# Patient Record
Sex: Male | Born: 1970 | Race: Black or African American | Hispanic: No | Marital: Single | Smoking: Never smoker
Health system: Southern US, Community
[De-identification: ages and names within clinical notes are randomized; demographics above are authoritative.]

## PROBLEM LIST (undated history)

## (undated) DIAGNOSIS — F32A Depression, unspecified: Secondary | ICD-10-CM

## (undated) DIAGNOSIS — J9601 Acute respiratory failure with hypoxia: Secondary | ICD-10-CM

## (undated) DIAGNOSIS — N186 End stage renal disease: Secondary | ICD-10-CM

## (undated) DIAGNOSIS — IMO0001 Reserved for inherently not codable concepts without codable children: Secondary | ICD-10-CM

## (undated) DIAGNOSIS — G8250 Quadriplegia, unspecified: Secondary | ICD-10-CM

## (undated) DIAGNOSIS — R079 Chest pain, unspecified: Secondary | ICD-10-CM

## (undated) DIAGNOSIS — F329 Major depressive disorder, single episode, unspecified: Secondary | ICD-10-CM

## (undated) DIAGNOSIS — F259 Schizoaffective disorder, unspecified: Secondary | ICD-10-CM

## (undated) DIAGNOSIS — E0590 Thyrotoxicosis, unspecified without thyrotoxic crisis or storm: Secondary | ICD-10-CM

## (undated) DIAGNOSIS — Z992 Dependence on renal dialysis: Secondary | ICD-10-CM

## (undated) DIAGNOSIS — F250 Schizoaffective disorder, bipolar type: Secondary | ICD-10-CM

## (undated) DIAGNOSIS — I10 Essential (primary) hypertension: Secondary | ICD-10-CM

## (undated) DIAGNOSIS — D649 Anemia, unspecified: Secondary | ICD-10-CM

## (undated) DIAGNOSIS — J189 Pneumonia, unspecified organism: Secondary | ICD-10-CM

## (undated) HISTORY — PX: DIALYSIS FISTULA CREATION: SHX611

---

## 2002-03-31 ENCOUNTER — Emergency Department (HOSPITAL_COMMUNITY): Admission: EM | Admit: 2002-03-31 | Discharge: 2002-03-31 | Payer: Self-pay | Admitting: Emergency Medicine

## 2002-03-31 ENCOUNTER — Encounter: Payer: Self-pay | Admitting: Emergency Medicine

## 2002-04-02 ENCOUNTER — Encounter (HOSPITAL_BASED_OUTPATIENT_CLINIC_OR_DEPARTMENT_OTHER): Admission: RE | Admit: 2002-04-02 | Discharge: 2002-07-01 | Payer: Self-pay | Admitting: Internal Medicine

## 2002-05-29 ENCOUNTER — Emergency Department (HOSPITAL_COMMUNITY): Admission: EM | Admit: 2002-05-29 | Discharge: 2002-05-29 | Payer: Self-pay | Admitting: *Deleted

## 2002-10-02 ENCOUNTER — Encounter: Payer: Self-pay | Admitting: Emergency Medicine

## 2002-10-02 ENCOUNTER — Emergency Department (HOSPITAL_COMMUNITY): Admission: EM | Admit: 2002-10-02 | Discharge: 2002-10-02 | Payer: Self-pay | Admitting: Emergency Medicine

## 2002-10-17 ENCOUNTER — Emergency Department (HOSPITAL_COMMUNITY): Admission: EM | Admit: 2002-10-17 | Discharge: 2002-10-17 | Payer: Self-pay | Admitting: Emergency Medicine

## 2003-08-29 ENCOUNTER — Inpatient Hospital Stay (HOSPITAL_COMMUNITY): Admission: EM | Admit: 2003-08-29 | Discharge: 2003-08-30 | Payer: Self-pay | Admitting: Emergency Medicine

## 2007-06-22 ENCOUNTER — Emergency Department (HOSPITAL_COMMUNITY): Admission: EM | Admit: 2007-06-22 | Discharge: 2007-06-22 | Payer: Self-pay | Admitting: Emergency Medicine

## 2007-10-02 ENCOUNTER — Emergency Department (HOSPITAL_COMMUNITY): Admission: EM | Admit: 2007-10-02 | Discharge: 2007-10-02 | Payer: Self-pay | Admitting: Emergency Medicine

## 2007-12-19 ENCOUNTER — Ambulatory Visit: Payer: Self-pay | Admitting: Cardiovascular Disease

## 2007-12-19 ENCOUNTER — Inpatient Hospital Stay (HOSPITAL_COMMUNITY): Admission: EM | Admit: 2007-12-19 | Discharge: 2008-01-04 | Payer: Self-pay | Admitting: Emergency Medicine

## 2007-12-20 ENCOUNTER — Encounter (INDEPENDENT_AMBULATORY_CARE_PROVIDER_SITE_OTHER): Payer: Self-pay | Admitting: Internal Medicine

## 2007-12-21 ENCOUNTER — Ambulatory Visit: Payer: Self-pay | Admitting: Psychiatry

## 2007-12-25 ENCOUNTER — Ambulatory Visit: Payer: Self-pay | Admitting: Vascular Surgery

## 2007-12-26 ENCOUNTER — Encounter (INDEPENDENT_AMBULATORY_CARE_PROVIDER_SITE_OTHER): Payer: Self-pay | Admitting: Internal Medicine

## 2009-05-23 ENCOUNTER — Ambulatory Visit: Payer: Self-pay | Admitting: Vascular Surgery

## 2009-05-23 ENCOUNTER — Inpatient Hospital Stay (HOSPITAL_COMMUNITY): Admission: EM | Admit: 2009-05-23 | Discharge: 2009-06-02 | Payer: Self-pay | Admitting: Emergency Medicine

## 2009-05-24 ENCOUNTER — Encounter (INDEPENDENT_AMBULATORY_CARE_PROVIDER_SITE_OTHER): Payer: Self-pay | Admitting: Internal Medicine

## 2009-05-25 ENCOUNTER — Ambulatory Visit: Payer: Self-pay | Admitting: Infectious Disease

## 2009-05-31 ENCOUNTER — Encounter (INDEPENDENT_AMBULATORY_CARE_PROVIDER_SITE_OTHER): Payer: Self-pay | Admitting: Cardiovascular Disease

## 2010-04-26 LAB — RENAL FUNCTION PANEL
Albumin: 2.1 g/dL — ABNORMAL LOW (ref 3.5–5.2)
Albumin: 2.6 g/dL — ABNORMAL LOW (ref 3.5–5.2)
Albumin: 2.6 g/dL — ABNORMAL LOW (ref 3.5–5.2)
Albumin: 2.7 g/dL — ABNORMAL LOW (ref 3.5–5.2)
Albumin: 2.7 g/dL — ABNORMAL LOW (ref 3.5–5.2)
BUN: 39 mg/dL — ABNORMAL HIGH (ref 6–23)
BUN: 44 mg/dL — ABNORMAL HIGH (ref 6–23)
BUN: 46 mg/dL — ABNORMAL HIGH (ref 6–23)
BUN: 73 mg/dL — ABNORMAL HIGH (ref 6–23)
BUN: 99 mg/dL — ABNORMAL HIGH (ref 6–23)
CO2: 18 mEq/L — ABNORMAL LOW (ref 19–32)
CO2: 19 mEq/L (ref 19–32)
CO2: 20 mEq/L (ref 19–32)
CO2: 23 mEq/L (ref 19–32)
CO2: 26 mEq/L (ref 19–32)
Calcium: 7 mg/dL — ABNORMAL LOW (ref 8.4–10.5)
Calcium: 8.8 mg/dL (ref 8.4–10.5)
Calcium: 8.8 mg/dL (ref 8.4–10.5)
Calcium: 8.8 mg/dL (ref 8.4–10.5)
Calcium: 8.9 mg/dL (ref 8.4–10.5)
Chloride: 109 mEq/L (ref 96–112)
Chloride: 94 mEq/L — ABNORMAL LOW (ref 96–112)
Chloride: 97 mEq/L (ref 96–112)
Chloride: 97 mEq/L (ref 96–112)
Chloride: 99 mEq/L (ref 96–112)
Creatinine, Ser: 10.09 mg/dL — ABNORMAL HIGH (ref 0.4–1.5)
Creatinine, Ser: 10.87 mg/dL — ABNORMAL HIGH (ref 0.4–1.5)
Creatinine, Ser: 12.76 mg/dL — ABNORMAL HIGH (ref 0.4–1.5)
Creatinine, Ser: 13.78 mg/dL — ABNORMAL HIGH (ref 0.4–1.5)
Creatinine, Ser: 8.78 mg/dL — ABNORMAL HIGH (ref 0.4–1.5)
GFR calc Af Amer: 5 mL/min — ABNORMAL LOW (ref >60)
GFR calc Af Amer: 5 mL/min — ABNORMAL LOW (ref >60)
GFR calc Af Amer: 6 mL/min — ABNORMAL LOW (ref >60)
GFR calc Af Amer: 7 mL/min — ABNORMAL LOW (ref >60)
GFR calc Af Amer: 8 mL/min — ABNORMAL LOW (ref >60)
GFR calc non Af Amer: 4 mL/min — ABNORMAL LOW (ref >60)
GFR calc non Af Amer: 4 mL/min — ABNORMAL LOW (ref >60)
GFR calc non Af Amer: 5 mL/min — ABNORMAL LOW (ref >60)
GFR calc non Af Amer: 6 mL/min — ABNORMAL LOW (ref >60)
GFR calc non Af Amer: 7 mL/min — ABNORMAL LOW (ref >60)
Glucose, Bld: 108 mg/dL — ABNORMAL HIGH (ref 70–99)
Glucose, Bld: 137 mg/dL — ABNORMAL HIGH (ref 70–99)
Glucose, Bld: 214 mg/dL — ABNORMAL HIGH (ref 70–99)
Glucose, Bld: 221 mg/dL — ABNORMAL HIGH (ref 70–99)
Glucose, Bld: 496 mg/dL — ABNORMAL HIGH (ref 70–99)
Phosphorus: 4.5 mg/dL (ref 2.3–4.6)
Phosphorus: 4.7 mg/dL — ABNORMAL HIGH (ref 2.3–4.6)
Phosphorus: 4.9 mg/dL — ABNORMAL HIGH (ref 2.3–4.6)
Phosphorus: 4.9 mg/dL — ABNORMAL HIGH (ref 2.3–4.6)
Phosphorus: 5.2 mg/dL — ABNORMAL HIGH (ref 2.3–4.6)
Potassium: 3.6 mEq/L (ref 3.5–5.1)
Potassium: 4 mEq/L (ref 3.5–5.1)
Potassium: 4.1 mEq/L (ref 3.5–5.1)
Potassium: 5.2 mEq/L — ABNORMAL HIGH (ref 3.5–5.1)
Potassium: 6.4 mEq/L (ref 3.5–5.1)
Sodium: 129 mEq/L — ABNORMAL LOW (ref 135–145)
Sodium: 130 mEq/L — ABNORMAL LOW (ref 135–145)
Sodium: 134 mEq/L — ABNORMAL LOW (ref 135–145)
Sodium: 134 mEq/L — ABNORMAL LOW (ref 135–145)
Sodium: 142 mEq/L (ref 135–145)

## 2010-04-26 LAB — GLUCOSE, CAPILLARY
Glucose-Capillary: 100 mg/dL — ABNORMAL HIGH (ref 70–99)
Glucose-Capillary: 102 mg/dL — ABNORMAL HIGH (ref 70–99)
Glucose-Capillary: 103 mg/dL — ABNORMAL HIGH (ref 70–99)
Glucose-Capillary: 103 mg/dL — ABNORMAL HIGH (ref 70–99)
Glucose-Capillary: 107 mg/dL — ABNORMAL HIGH (ref 70–99)
Glucose-Capillary: 112 mg/dL — ABNORMAL HIGH (ref 70–99)
Glucose-Capillary: 116 mg/dL — ABNORMAL HIGH (ref 70–99)
Glucose-Capillary: 118 mg/dL — ABNORMAL HIGH (ref 70–99)
Glucose-Capillary: 123 mg/dL — ABNORMAL HIGH (ref 70–99)
Glucose-Capillary: 138 mg/dL — ABNORMAL HIGH (ref 70–99)
Glucose-Capillary: 143 mg/dL — ABNORMAL HIGH (ref 70–99)
Glucose-Capillary: 149 mg/dL — ABNORMAL HIGH (ref 70–99)
Glucose-Capillary: 151 mg/dL — ABNORMAL HIGH (ref 70–99)
Glucose-Capillary: 155 mg/dL — ABNORMAL HIGH (ref 70–99)
Glucose-Capillary: 159 mg/dL — ABNORMAL HIGH (ref 70–99)
Glucose-Capillary: 164 mg/dL — ABNORMAL HIGH (ref 70–99)
Glucose-Capillary: 167 mg/dL — ABNORMAL HIGH (ref 70–99)
Glucose-Capillary: 178 mg/dL — ABNORMAL HIGH (ref 70–99)
Glucose-Capillary: 180 mg/dL — ABNORMAL HIGH (ref 70–99)
Glucose-Capillary: 181 mg/dL — ABNORMAL HIGH (ref 70–99)
Glucose-Capillary: 185 mg/dL — ABNORMAL HIGH (ref 70–99)
Glucose-Capillary: 200 mg/dL — ABNORMAL HIGH (ref 70–99)
Glucose-Capillary: 209 mg/dL — ABNORMAL HIGH (ref 70–99)
Glucose-Capillary: 219 mg/dL — ABNORMAL HIGH (ref 70–99)
Glucose-Capillary: 227 mg/dL — ABNORMAL HIGH (ref 70–99)
Glucose-Capillary: 238 mg/dL — ABNORMAL HIGH (ref 70–99)
Glucose-Capillary: 239 mg/dL — ABNORMAL HIGH (ref 70–99)
Glucose-Capillary: 242 mg/dL — ABNORMAL HIGH (ref 70–99)
Glucose-Capillary: 246 mg/dL — ABNORMAL HIGH (ref 70–99)
Glucose-Capillary: 253 mg/dL — ABNORMAL HIGH (ref 70–99)
Glucose-Capillary: 254 mg/dL — ABNORMAL HIGH (ref 70–99)
Glucose-Capillary: 257 mg/dL — ABNORMAL HIGH (ref 70–99)
Glucose-Capillary: 275 mg/dL — ABNORMAL HIGH (ref 70–99)
Glucose-Capillary: 28 mg/dL — CL (ref 70–99)
Glucose-Capillary: 301 mg/dL — ABNORMAL HIGH (ref 70–99)
Glucose-Capillary: 315 mg/dL — ABNORMAL HIGH (ref 70–99)
Glucose-Capillary: 317 mg/dL — ABNORMAL HIGH (ref 70–99)
Glucose-Capillary: 348 mg/dL — ABNORMAL HIGH (ref 70–99)
Glucose-Capillary: 357 mg/dL — ABNORMAL HIGH (ref 70–99)
Glucose-Capillary: 360 mg/dL — ABNORMAL HIGH (ref 70–99)
Glucose-Capillary: 388 mg/dL — ABNORMAL HIGH (ref 70–99)
Glucose-Capillary: 42 mg/dL — CL (ref 70–99)
Glucose-Capillary: 421 mg/dL — ABNORMAL HIGH (ref 70–99)
Glucose-Capillary: 428 mg/dL — ABNORMAL HIGH (ref 70–99)
Glucose-Capillary: 43 mg/dL — CL (ref 70–99)
Glucose-Capillary: 433 mg/dL — ABNORMAL HIGH (ref 70–99)
Glucose-Capillary: 435 mg/dL — ABNORMAL HIGH (ref 70–99)
Glucose-Capillary: 438 mg/dL — ABNORMAL HIGH (ref 70–99)
Glucose-Capillary: 443 mg/dL — ABNORMAL HIGH (ref 70–99)
Glucose-Capillary: 515 mg/dL — ABNORMAL HIGH (ref 70–99)
Glucose-Capillary: 52 mg/dL — ABNORMAL LOW (ref 70–99)
Glucose-Capillary: 525 mg/dL — ABNORMAL HIGH (ref 70–99)
Glucose-Capillary: 568 mg/dL (ref 70–99)
Glucose-Capillary: 63 mg/dL — ABNORMAL LOW (ref 70–99)
Glucose-Capillary: 66 mg/dL — ABNORMAL LOW (ref 70–99)
Glucose-Capillary: 72 mg/dL (ref 70–99)
Glucose-Capillary: 75 mg/dL (ref 70–99)
Glucose-Capillary: 77 mg/dL (ref 70–99)
Glucose-Capillary: 81 mg/dL (ref 70–99)
Glucose-Capillary: 88 mg/dL (ref 70–99)
Glucose-Capillary: 95 mg/dL (ref 70–99)
Glucose-Capillary: 98 mg/dL (ref 70–99)

## 2010-04-26 LAB — CBC
HCT: 21.8 % — ABNORMAL LOW (ref 39.0–52.0)
HCT: 24.6 % — ABNORMAL LOW (ref 39.0–52.0)
HCT: 25.1 % — ABNORMAL LOW (ref 39.0–52.0)
HCT: 25.1 % — ABNORMAL LOW (ref 39.0–52.0)
HCT: 26.4 % — ABNORMAL LOW (ref 39.0–52.0)
HCT: 27.3 % — ABNORMAL LOW (ref 39.0–52.0)
HCT: 27.7 % — ABNORMAL LOW (ref 39.0–52.0)
HCT: 28 % — ABNORMAL LOW (ref 39.0–52.0)
HCT: 28 % — ABNORMAL LOW (ref 39.0–52.0)
HCT: 28.1 % — ABNORMAL LOW (ref 39.0–52.0)
HCT: 28.2 % — ABNORMAL LOW (ref 39.0–52.0)
Hemoglobin: 7.2 g/dL — ABNORMAL LOW (ref 13.0–17.0)
Hemoglobin: 8.1 g/dL — ABNORMAL LOW (ref 13.0–17.0)
Hemoglobin: 8.3 g/dL — ABNORMAL LOW (ref 13.0–17.0)
Hemoglobin: 8.4 g/dL — ABNORMAL LOW (ref 13.0–17.0)
Hemoglobin: 8.6 g/dL — ABNORMAL LOW (ref 13.0–17.0)
Hemoglobin: 9.1 g/dL — ABNORMAL LOW (ref 13.0–17.0)
Hemoglobin: 9.1 g/dL — ABNORMAL LOW (ref 13.0–17.0)
Hemoglobin: 9.1 g/dL — ABNORMAL LOW (ref 13.0–17.0)
Hemoglobin: 9.2 g/dL — ABNORMAL LOW (ref 13.0–17.0)
Hemoglobin: 9.2 g/dL — ABNORMAL LOW (ref 13.0–17.0)
Hemoglobin: 9.3 g/dL — ABNORMAL LOW (ref 13.0–17.0)
MCHC: 32.4 g/dL (ref 30.0–36.0)
MCHC: 32.6 g/dL (ref 30.0–36.0)
MCHC: 32.7 g/dL (ref 30.0–36.0)
MCHC: 32.8 g/dL (ref 30.0–36.0)
MCHC: 32.8 g/dL (ref 30.0–36.0)
MCHC: 32.9 g/dL (ref 30.0–36.0)
MCHC: 33 g/dL (ref 30.0–36.0)
MCHC: 33 g/dL (ref 30.0–36.0)
MCHC: 33.1 g/dL (ref 30.0–36.0)
MCHC: 33.3 g/dL (ref 30.0–36.0)
MCHC: 33.4 g/dL (ref 30.0–36.0)
MCV: 85 fL (ref 78.0–100.0)
MCV: 85.4 fL (ref 78.0–100.0)
MCV: 85.5 fL (ref 78.0–100.0)
MCV: 85.5 fL (ref 78.0–100.0)
MCV: 85.7 fL (ref 78.0–100.0)
MCV: 85.7 fL (ref 78.0–100.0)
MCV: 85.8 fL (ref 78.0–100.0)
MCV: 85.8 fL (ref 78.0–100.0)
MCV: 85.8 fL (ref 78.0–100.0)
MCV: 85.9 fL (ref 78.0–100.0)
MCV: 85.9 fL (ref 78.0–100.0)
Platelets: 277 10*3/uL (ref 150–400)
Platelets: 277 10*3/uL (ref 150–400)
Platelets: 283 10*3/uL (ref 150–400)
Platelets: 284 10*3/uL (ref 150–400)
Platelets: 286 10*3/uL (ref 150–400)
Platelets: 311 10*3/uL (ref 150–400)
Platelets: 326 10*3/uL (ref 150–400)
Platelets: 358 10*3/uL (ref 150–400)
Platelets: 366 10*3/uL (ref 150–400)
Platelets: 371 10*3/uL (ref 150–400)
Platelets: 379 10*3/uL (ref 150–400)
RBC: 2.54 MIL/uL — ABNORMAL LOW (ref 4.22–5.81)
RBC: 2.86 MIL/uL — ABNORMAL LOW (ref 4.22–5.81)
RBC: 2.94 MIL/uL — ABNORMAL LOW (ref 4.22–5.81)
RBC: 2.94 MIL/uL — ABNORMAL LOW (ref 4.22–5.81)
RBC: 3.1 MIL/uL — ABNORMAL LOW (ref 4.22–5.81)
RBC: 3.19 MIL/uL — ABNORMAL LOW (ref 4.22–5.81)
RBC: 3.23 MIL/uL — ABNORMAL LOW (ref 4.22–5.81)
RBC: 3.26 MIL/uL — ABNORMAL LOW (ref 4.22–5.81)
RBC: 3.27 MIL/uL — ABNORMAL LOW (ref 4.22–5.81)
RBC: 3.27 MIL/uL — ABNORMAL LOW (ref 4.22–5.81)
RBC: 3.28 MIL/uL — ABNORMAL LOW (ref 4.22–5.81)
RDW: 18.5 % — ABNORMAL HIGH (ref 11.5–15.5)
RDW: 18.6 % — ABNORMAL HIGH (ref 11.5–15.5)
RDW: 18.6 % — ABNORMAL HIGH (ref 11.5–15.5)
RDW: 18.7 % — ABNORMAL HIGH (ref 11.5–15.5)
RDW: 18.8 % — ABNORMAL HIGH (ref 11.5–15.5)
RDW: 19 % — ABNORMAL HIGH (ref 11.5–15.5)
RDW: 19.1 % — ABNORMAL HIGH (ref 11.5–15.5)
RDW: 19.3 % — ABNORMAL HIGH (ref 11.5–15.5)
RDW: 19.3 % — ABNORMAL HIGH (ref 11.5–15.5)
RDW: 19.4 % — ABNORMAL HIGH (ref 11.5–15.5)
RDW: 19.4 % — ABNORMAL HIGH (ref 11.5–15.5)
WBC: 10 10*3/uL (ref 4.0–10.5)
WBC: 10.3 10*3/uL (ref 4.0–10.5)
WBC: 11.5 10*3/uL — ABNORMAL HIGH (ref 4.0–10.5)
WBC: 12.5 10*3/uL — ABNORMAL HIGH (ref 4.0–10.5)
WBC: 14.1 10*3/uL — ABNORMAL HIGH (ref 4.0–10.5)
WBC: 14.6 10*3/uL — ABNORMAL HIGH (ref 4.0–10.5)
WBC: 16.3 10*3/uL — ABNORMAL HIGH (ref 4.0–10.5)
WBC: 18.7 10*3/uL — ABNORMAL HIGH (ref 4.0–10.5)
WBC: 7.4 10*3/uL (ref 4.0–10.5)
WBC: 8.1 10*3/uL (ref 4.0–10.5)
WBC: 9.7 10*3/uL (ref 4.0–10.5)

## 2010-04-26 LAB — CULTURE, BLOOD (ROUTINE X 2)
Culture: NO GROWTH
Culture: NO GROWTH

## 2010-04-26 LAB — CROSSMATCH
ABO/RH(D): B NEG
Antibody Screen: NEGATIVE

## 2010-04-26 LAB — BASIC METABOLIC PANEL
BUN: 37 mg/dL — ABNORMAL HIGH (ref 6–23)
BUN: 63 mg/dL — ABNORMAL HIGH (ref 6–23)
BUN: 82 mg/dL — ABNORMAL HIGH (ref 6–23)
CO2: 18 mEq/L — ABNORMAL LOW (ref 19–32)
CO2: 20 mEq/L (ref 19–32)
CO2: 24 mEq/L (ref 19–32)
Calcium: 8.9 mg/dL (ref 8.4–10.5)
Calcium: 9 mg/dL (ref 8.4–10.5)
Calcium: 9.3 mg/dL (ref 8.4–10.5)
Chloride: 104 mEq/L (ref 96–112)
Chloride: 96 mEq/L (ref 96–112)
Chloride: 99 mEq/L (ref 96–112)
Creatinine, Ser: 10.57 mg/dL — ABNORMAL HIGH (ref 0.4–1.5)
Creatinine, Ser: 12.37 mg/dL — ABNORMAL HIGH (ref 0.4–1.5)
Creatinine, Ser: 9.01 mg/dL — ABNORMAL HIGH (ref 0.4–1.5)
GFR calc Af Amer: 6 mL/min — ABNORMAL LOW (ref >60)
GFR calc Af Amer: 7 mL/min — ABNORMAL LOW (ref >60)
GFR calc Af Amer: 8 mL/min — ABNORMAL LOW (ref >60)
GFR calc non Af Amer: 5 mL/min — ABNORMAL LOW (ref >60)
GFR calc non Af Amer: 6 mL/min — ABNORMAL LOW (ref >60)
GFR calc non Af Amer: 7 mL/min — ABNORMAL LOW (ref >60)
Glucose, Bld: 178 mg/dL — ABNORMAL HIGH (ref 70–99)
Glucose, Bld: 184 mg/dL — ABNORMAL HIGH (ref 70–99)
Glucose, Bld: 435 mg/dL — ABNORMAL HIGH (ref 70–99)
Potassium: 4.6 mEq/L (ref 3.5–5.1)
Potassium: 5.1 mEq/L (ref 3.5–5.1)
Potassium: 5.8 mEq/L — ABNORMAL HIGH (ref 3.5–5.1)
Sodium: 133 mEq/L — ABNORMAL LOW (ref 135–145)
Sodium: 135 mEq/L (ref 135–145)
Sodium: 137 mEq/L (ref 135–145)

## 2010-04-26 LAB — COMPREHENSIVE METABOLIC PANEL
ALT: 24 U/L (ref 0–53)
AST: 69 U/L — ABNORMAL HIGH (ref 0–37)
Albumin: 3 g/dL — ABNORMAL LOW (ref 3.5–5.2)
Alkaline Phosphatase: 109 U/L (ref 39–117)
BUN: 87 mg/dL — ABNORMAL HIGH (ref 6–23)
CO2: 20 mEq/L (ref 19–32)
Calcium: 9.2 mg/dL (ref 8.4–10.5)
Chloride: 102 mEq/L (ref 96–112)
Creatinine, Ser: 12.98 mg/dL — ABNORMAL HIGH (ref 0.4–1.5)
GFR calc Af Amer: 5 mL/min — ABNORMAL LOW (ref >60)
GFR calc non Af Amer: 4 mL/min — ABNORMAL LOW (ref >60)
Glucose, Bld: 74 mg/dL (ref 70–99)
Potassium: 5.4 mEq/L — ABNORMAL HIGH (ref 3.5–5.1)
Sodium: 138 mEq/L (ref 135–145)
Total Bilirubin: 0.8 mg/dL (ref 0.3–1.2)
Total Protein: 6.7 g/dL (ref 6.0–8.3)

## 2010-04-26 LAB — DIFFERENTIAL
Basophils Absolute: 0 10*3/uL (ref 0.0–0.1)
Basophils Absolute: 0 10*3/uL (ref 0.0–0.1)
Basophils Relative: 0 % (ref 0–1)
Basophils Relative: 0 % (ref 0–1)
Eosinophils Absolute: 0 10*3/uL (ref 0.0–0.7)
Eosinophils Absolute: 0.2 10*3/uL (ref 0.0–0.7)
Eosinophils Relative: 0 % (ref 0–5)
Eosinophils Relative: 2 % (ref 0–5)
Lymphocytes Relative: 1 % — ABNORMAL LOW (ref 12–46)
Lymphocytes Relative: 6 % — ABNORMAL LOW (ref 12–46)
Lymphs Abs: 0.1 10*3/uL — ABNORMAL LOW (ref 0.7–4.0)
Lymphs Abs: 0.8 10*3/uL (ref 0.7–4.0)
Monocytes Absolute: 0.5 10*3/uL (ref 0.1–1.0)
Monocytes Absolute: 2.1 10*3/uL — ABNORMAL HIGH (ref 0.1–1.0)
Monocytes Relative: 14 % — ABNORMAL HIGH (ref 3–12)
Monocytes Relative: 5 % (ref 3–12)
Neutro Abs: 10.8 10*3/uL — ABNORMAL HIGH (ref 1.7–7.7)
Neutro Abs: 11.5 10*3/uL — ABNORMAL HIGH (ref 1.7–7.7)
Neutrophils Relative %: 79 % — ABNORMAL HIGH (ref 43–77)
Neutrophils Relative %: 94 % — ABNORMAL HIGH (ref 43–77)

## 2010-04-26 LAB — URINE CULTURE
Colony Count: 45000
Colony Count: >=100000

## 2010-04-26 LAB — VANCOMYCIN, TROUGH: Vancomycin Tr: 18.2 ug/mL (ref 10.0–20.0)

## 2010-04-26 LAB — URINALYSIS, ROUTINE W REFLEX MICROSCOPIC
Glucose, UA: NEGATIVE mg/dL
Ketones, ur: NEGATIVE mg/dL
Nitrite: NEGATIVE
Protein, ur: >300 mg/dL — AB
Specific Gravity, Urine: >1.03 — ABNORMAL HIGH (ref 1.005–1.030)
Urobilinogen, UA: 0.2 mg/dL (ref 0.0–1.0)
pH: 6 (ref 5.0–8.0)

## 2010-04-26 LAB — MRSA PCR SCREENING

## 2010-04-26 LAB — URINE MICROSCOPIC-ADD ON

## 2010-04-26 LAB — HEPATITIS B SURFACE ANTIGEN: Hepatitis B Surface Ag: NEGATIVE

## 2010-04-26 LAB — HIV ANTIBODY (ROUTINE TESTING W REFLEX): HIV: NONREACTIVE

## 2010-04-26 LAB — ABO/RH: ABO/RH(D): B NEG

## 2010-04-26 LAB — HEMOGLOBIN A1C
Hgb A1c MFr Bld: 8.4 % — ABNORMAL HIGH (ref <5.7)
Mean Plasma Glucose: 194 mg/dL — ABNORMAL HIGH (ref <117)

## 2010-04-26 LAB — LACTIC ACID, PLASMA: Lactic Acid, Venous: 1.3 mmol/L (ref 0.5–2.2)

## 2010-06-21 NOTE — Group Therapy Note (Signed)
NAMEELMOR, BONFANTE             ACCOUNT NO.:  [id number]   MEDICAL RECORD NO.:  [id number]          PATIENT TYPE:  INP   LOCATION:  6732                         FACILITY:  [facility]   PHYSICIAN:  Helen Hashimoto, MD    DATE OF BIRTH:  30-Oct-1970                                 PROGRESS NOTE   Initial discharge summary was dictated on December 24, 2007.   DIAGNOSES AT THE TIME OF DICTATION:  Include:  1. End-stage renal disease.  Patient is in hemodialysis.  2. Hypertension.  3. Hyperlipidemia.  4. Diabetes mellitus.  5. Diabetic retinopathy.  6. Prior history of spina bifida.  7. Aggressive behavior.   DISCHARGE MEDICATIONS:  To be dictated at the time of discharge.   CONSULTATIONS:  No new consultations since last dictation.   PROCEDURE:  All procedures prior to December 24, 2007, were dictated.  Following  that, patient had chest x-ray on December 23, 2007, that showed central  venous catheter without evidence of pneumothorax.   COURSE OF HOSPITALIZATION:  For detailed course of hospitalization up to December 24, 2007, please  see prior discharge summary dictated by Dr. Lydia Guiles.  Since that time,  updates are as follows.  1. End-stage renal disease.  Patient continued to be followed by      nephrology.  On December 25, 2007, patient underwent Diatek      catheter via right internal jugular vein.  Dialysis was started      through the catheter and patient has been receiving daily dialysis      since that time.  On December 30, 2007, a graft was placed.      Patient will continue on hemodialysis per nephrology.  2. Hypertension.  Blood pressure has been up and down.  Blood pressure      has been adjusted during the hospitalization.  Currently, patient      is on Norvasc 5 mg, lisinopril 40 mg, and Lopressor of 50 mg twice      daily.  On discharge, blood pressure is better and last systolic      blood pressure was in the 150s.  We will continue to follow blood      pressure  and adjust medications as needed.  3. Diabetes mellitus.  Patient was continued on Lantus which was      increased to 34 units at bedtime.  He was also continued on sliding      scale.   DISPOSITION:  Patient has been having very aggressive behavior to the nurses and has  been cursing nurses or threatening some of the nurses.  I consulted Dr.  Rhona Raider to see him.  He continued to have social issues where he has  been homeless and was living in his car for the last 4 years.  Social  service was consulted and his information was sent for placement.  This  patient definitely will need placement since he started on hemodialysis.  When a skilled nursing facility is established, then outpatient  hemodialysis arrangements can be done and patient can be discharged.  Updated discharge summary will be dictated prior to  discharging  disposition.   TOTAL ASSESSMENT TIME:  40 minutes.      Helen Hashimoto, MD  Electronically Signed     NAE/MEDQ  D:  12/30/2007  T:  12/30/2007  Job:  [id number]

## 2010-06-21 NOTE — Consult Note (Signed)
[name], [name] NO.:  [id number]   MEDICAL RECORD NO.:  [id number]          PATIENT TYPE:  INP   LOCATION:  6732                         FACILITY:  [facility]   PHYSICIAN:  Felizardo Hoffmann, M.D.  DATE OF BIRTH:  07-Jun-1970   DATE OF CONSULTATION:  12/31/2007  DATE OF DISCHARGE:  01/04/2008                                 CONSULTATION   REQUESTING PHYSICIAN:  [facility].   REASON FOR CONSULTATION:  Rule out psychosis evaluate and recommend  treatment.   HISTORY OF PRESENT ILLNESS:  [name] is a 40 year old male admitted  to the [facility] on December 19, 2007, due to chronic renal failure,  hypertensive crisis.   [name] developed approximately 4 days of stupor, confusion,  agitation, and thought disorganization.  This has now cleared; however,  he does continue with depressed mood.   He does have at least 4 weeks of progressive depressed mood, decreased  energy, difficulty concentrating.  He does not have any thoughts of  harming himself or others.  He has no delusions or hallucinations.  His  orientation and memory function are now intact.   His mental symptoms appear to have correlated with his exacerbation of  general medical problems.  The confusion has now resolved.   PAST PSYCHIATRIC HISTORY:  During the period of confusion, he did have  delusions that he was in the mall.   [name] has no history of increased energy or decreased need for  sleep.  He has never attempted suicide.  He has no history of prior  mental treatment.   FAMILY PSYCHIATRIC HISTORY:  None known.   SOCIAL HISTORY:  [name] mother passed away.  He does not use  alcohol or illegal drugs.  He has 2 children.  He is not employed.  He  is medically disabled.   PAST MEDICAL HISTORY:  Hypertension, chronic renal disease, diabetes,  retinopathy, spina bifida.   MEDICATIONS:  MAR is reviewed.  [name] is on Restoril 15 mg nightly  p.r.n.   No known  drug allergies.   LABORATORY DATA:  Sodium 139, BUN 40, creatinine 0.11.  WBC 11.1,  hemoglobin 8.8, platelet count 382.  SGPT 32, SGOT 31.  Hepatitis B core  antigen is negative.   Hepatitis surface antigen, 123456, and folic acid unremarkable.   On December 20, 2007, the QTc was 441 msec.   REVIEW OF SYSTEMS:  CONSTITUTIONAL:  Head, eyes, ears, nose, throat,  mouth, neurologic, psychiatric, cardiovascular, respiratory,  gastrointestinal, genitourinary, skin, musculoskeletal, hematologic,  lymphatic, endocrine metabolic all unremarkable.   PHYSICAL EXAMINATION:  VITAL SIGNS:  Temperature 99.2, pulse 100,  respiratory rate 18, blood pressure 164/96, O2 saturation on room air  96%.  GENERAL APPEARANCE:  [name] is a young male lying in a supine  position in his hospital bed with no abnormal involuntary movements.   MENTAL STATUS EXAM:  [name] is alert.  His eye contact is good.  His attention span is slightly decreased, concentration mildly  decreased.  Affect is constricted.  Mood is depressed.  He is oriented  completely to  all spheres.  His memory is intact to immediate, recent,  and remote except for the delirium.  His fund of knowledge and  intelligence are within normal limits.  Speech involves normal rate and  prosody without dysarthria.  Thought process is logical, coherent, and  goal directed.  No looseness of association.  Thought content, no  thoughts of harming himself, no thoughts of harming others. no  delusions, no hallucinations.  Insight is intact.  Judgment is intact.   ASSESSMENT:  AXIS I:  1. 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  2. 293.00, delirium, not otherwise specified, now resolved.  3. 296.23, major depressive disorder, single episode, severe.  AXIS II:  Deferred.  AXIS III:  See past medical history.  AXIS IV:  General medical grief, primary support group.  AXIS V:  55.   [name] is not at risk to harm himself or others.  He agrees to call  emergency services immediately for any thoughts of harming himself or  others or distress.   The undersigned, provided ego supportive psychotherapy and education.   The indications, alternatives, and adverse effects of Celexa, Ativan,  and Haldol were discussed with the patient.  The patient understands and  wants to proceed with the recommendations below.   RECOMMENDATIONS:  1. It does appear that the delirium has resolved completely; however,      if the delusions, agitation, combativeness return, we would start      standing Haldol 1 mg b.i.d. p.o. or IV slow push.  2. Would then recheck an EKG to confirm that the QTc is less than 500      msec.  3. Would continue with Ativan 1-2 mg p.o. IM or IV q.4 hours p.r.n.      agitation.  4. Would start Celexa 10 mg q.a.m. and then increase to 20 mg q.a.m.      in 4 days as tolerated for antidepression.  Would monitor for loose      stools, nausea, or other adverse Celexa effects.  5. Would ask the social worker to set an outpatient psychiatric      appointment for [name] during the first week of discharge.      Outpatient psychiatric care is available at one of the clinics      attached to [facility], or [facility].      Felizardo Hoffmann, M.D.  Electronically Signed     JW/MEDQ  D:  04/12/2008  T:  04/13/2008  Job:  [id number]

## 2010-06-21 NOTE — Discharge Summary (Signed)
NAMESHASTA, CAPSTICK NO.:  [id number]   MEDICAL RECORD NO.:  [id number]          PATIENT TYPE:  INP   LOCATION:  6732                         FACILITY:  [facility] [place]   PHYSICIAN:  Sherryl Manges, M.D.  DATE OF BIRTH:  September 28, 1970   DATE OF ADMISSION:  12/19/2007  DATE OF DISCHARGE:  01/03/2008                               DISCHARGE SUMMARY   ADDENDUM:   PRIMARY MEDICAL DOCTOR:  [facility].  Patient is unassigned to [place].   For discharge diagnoses, consultations, admission history, detailed  clinical course and procedures, refer to interim summaries dictated  December 24, 2007 by Neysa Bonito, MD, and also on December 30, 2007 by Helen Hashimoto, MD.  However, for the period from December 31, 2007 to January 03, 2008, i.e. the date of this dictation, the  following are pertinent:  The patient's clinical condition remained stable.  He continued on  hemodialysis Mondays, Wednesdays and Fridays, and remained euvolemic.  Hypertension was brought under reasonable control with a combination of  Norvasc and high-dose Lisinopril.  Diabetes mellitus was addressed by  adjustment of scheduled Lantus insulin, utilization of carbohydrate  modified diet, as well as sliding-scale insulin coverage.  The patient  was seen by nephrologist, i.e. Dr. Marval Regal on January 03, 2008 and  was considered clinically stable from a renal viewpoint, to be  discharged.   DISPOSITION:  As there were no new issues and patient was clinically  stable, he was discharged on January 03, 2008.   DISCHARGE MEDICATIONS:  1. Aspirin 81 mg p.o. daily.  2. Calcium carbonate 500 mg p.o. t.i.d.  3. Nephrovite 1 tablet p.o. q.h.s.  4. Epo 20,000 units IV during each dialysis.  5. Lopressor 50 mg p.o. b.i.d. (Hold before dialysis).  6. Hectorol 0.5 mcg IV on Mondays, Wednesdays, and Fridays during      dialysis.  7. Norvasc 10 mg p.o. q.h.s.  8.  Celexa 10 mg p.o. daily.  9. Lantus insulin 40 units subcutaneously q.h.s.  10.Lisinopril 40 mg p.o. b.i.d. (Hold before dialysis).  11.Nepro/Vanilla liquid nutritional supplement, 237 mL p.o. t.i.d.  12.Iron dextran complex 50 mg IV on Wednesdays at hemodialysis.  13.Sliding-scale insulin coverage with NovoLog as follows:  CBG 70-      100, no insulin; CBG 101-150, 2 units; CBG 151-200, 3 units; CBG      201-250, 5 units; CBG 251-300, 7 units; CBG 301-350, 9 units; CBG      351-400, 11 units.  14.Tylox 1-2 tablets p.o. p.r.n. q.4 h. for pain. A total of 20 pills      have been dispensed.   Note: Lasix, Insulin (70/30), and Labetalol, have all been discontinued.   DIET:  Renal diet, 60-2-2.  No concentrated sweets, 1200 mL of fluid per  day.   ACTIVITY:  As tolerated.   WOUND CARE:  No showers.  Keep catheter site dry.   FOLLOW UP:  The patient is to follow up with nephrologist, Dr. Corliss Parish, at date to be determined.  He has been instructed to call  for  an appointment.  He is also to follow up with his primary MD at  [facility], per scheduled  appointment.      Sherryl Manges, M.D.  Electronically Signed     CO/MEDQ  D:  01/03/2008  T:  01/03/2008  Job:  [id number]   cc:   Louis Meckel, M.D.

## 2010-06-21 NOTE — Consult Note (Signed)
[name], [name]             ACCOUNT NO.:  [id number]   MEDICAL RECORD NO.:  [id number]          PATIENT TYPE:  INP   LOCATION:  2915                         FACILITY:  [facility]   PHYSICIAN:  Louis Meckel, M.D.DATE OF BIRTH:  July 26, 1970   DATE OF CONSULTATION:  12/20/2007  DATE OF DISCHARGE:                                 CONSULTATION   REQUESTING PHYSICIAN:  Neysa Bonito, MD   REASON FOR CONSULTATION:  Progressive and advanced chronic kidney  disease.   HISTORY OF PRESENT ILLNESS:  [name] is a 40 year old black male  with past medical history significant for type 1 diabetes mellitus,  hypertension, hyperlipidemia, spina bifida, and advanced diabetic  retinopathy who has been receiving primary care through the emergency  department at [facility].  He was there just last week and stayed  for a couple of days.  Creatinine was noted to be in 2s about a year ago  and now has been progressing up into the 5 and 6 range.  He has been  followed by the nephrologist at [facility] but only as an inpatient.  The  nephrologists were recommending access and actually he has been vein  mapped and has an appointment on Monday to get the access placed.  He  has refused PermCath placement and initiation of dialysis right now, but  he has consented for a fistula placement for presumed initiation of a  dialysis in the future.  The patient was visiting a friend here (he  lives in [place]) but decided to stop by the emergency room today  because he noticed his leg swelling which has been chronic.  He had  dyspnea on exertion.  He did have an elevated white blood count and has  elevated CPK and elevated glucose as well.  Creatinine is 8.3.  Therefore, he was admitted and started on antibiotics with Rocephin,  azithromycin, and Tamiflu and was also started on high dose Lasix.  He  says he is making urine, but none is recorded.   PAST MEDICAL HISTORY:  1. Type 1 diabetes  mellitus, poorly controlled, has advanced      retinopathy.  2. Hypertension.  3. CKD which is progressive as outlined above.  4. Spina bifida.  5. Hyperlipidemia.   CURRENT MEDICATIONS:  1. Zithromax 500 IV daily.  2. Rocephin 1 gram IV daily.  3. Tamiflu b.i.d.  4. Lopressor 12.5 b.i.d.  5. Aspirin.  6. Protonix.  7. Lasix 160 mg IV q.8 h.  8. Lantus insulin.   ALLERGIES:  No known drug allergies.   SOCIAL HISTORY:  The patient is disabled.  He denies alcohol or tobacco  use.   FAMILY HISTORY:  Positive for diabetes.   REVIEW OF SYSTEMS:  Positive for edema, nausea, decreased energy,  decreased vision, and dyspnea on exertion but he is able to be eat.  He  denies any fevers, chills, or rashes.  Otherwise, review of systems is  negative.   PHYSICAL EXAMINATION:  VITAL SIGNS:  Temperature emergency department  was 102.6, now 100.3, blood pressure 169/94 on nitro drip, heart rate is  114, respirations  16, and oxygen saturation is 96% on 3 L.  GENERAL:  The patient is in no acute distress and talkative.  He is  fairly to gather on his medical details.  He was swaying the left arm.  HEENT:  Pupils are equal, round, and reactive to light.  Extraocular  motions are intact.  NECK:  There is no jugular venous distention.  LUNGS:  Decreased breath sounds at bases bilaterally.  CARDIOVASCULAR:  Tachycardic.  No murmur.  ABDOMEN:  Soft and nontender.  EXTREMITIES:  Reveal 2-3+ plus edema.   LABORATORY DATA:  Potassium 4.8, bicarb 19, BUN and creatinine 82 and  7.63, albumin 3.0.  CPK is 996.  White blood count is 25.6 and  hemoglobin of 8.1.  Urinalysis shows greater than 300 mg of protein/dL  with hyaline cast and 11-20 RBCs per high power field.   ASSESSMENT:  A 39 year old black male with advanced chronic kidney  disease secondary to diabetic nephropathy and volume overload.  1. Renal:  Advanced chronic kidney disease secondary to diabetic      nephropathy.  Full workup at  [facility].  In fact, he has been      vein mapped and has an appointment for arteriovenous fistula on      Monday.  It really would be best to keep that plan in place.  We      would not have to repeat all tests and reinvent the wheel.  2. Volume overload.  He says he usually responds to Lasix.  He is on      high dose IV Lasix and we will continue to follow.   DISPOSITION:  The patient is refusing initiation of dialysis right now,  although he could use.  He has an appointment for a fistula Monday which  is as soon as we can get it here.  So, if the patient is ready or stable  for discharge by Sunday, I would pursue that, so he can keep his  outpatient appointment but we will continue to follow with you.           ______________________________  Louis Meckel, M.D.     KAG/MEDQ  D:  12/20/2007  T:  12/20/2007  Job:  [id number]

## 2010-06-21 NOTE — Op Note (Signed)
[name], [name]             ACCOUNT NO.:  [id number]   MEDICAL RECORD NO.:  [id number]          PATIENT TYPE:  INP   LOCATION:                               FACILITY:  [place]   PHYSICIAN:  Nelda Severe. Kellie Simmering, M.D.  DATE OF BIRTH:  1970/06/09   DATE OF PROCEDURE:  12/25/2007  DATE OF DISCHARGE:                               OPERATIVE REPORT   PREOPERATIVE DIAGNOSIS:  End-stage renal disease.   POSTOPERATIVE DIAGNOSIS:  End-stage renal disease.   OPERATION:  1. Bilateral ultrasound, localization of internal jugular veins.  2. Insertion of Diatek catheter via right internal jugular vein (28      cm).   SURGEON:  Nelda Severe. Kellie Simmering, MD   FIRST ASSISTANT:  Nurse.   ANESTHESIA:  Local.   PROCEDURE:  The patient was taken to the operating room, placed in  supine position at which time, upper chest and neck were exposed, both  internal jugular veins were imaged using B-mode ultrasound, both noted  to be widely patent and both were photographed.  After prepping and  draping in routine sterile manner, right internal jugular vein was  entered using a supraclavicular approach.  Guidewire passed into the  right atrium under fluoroscopic guidance.  After dilating the tract  appropriately, a 28-cm Diatek catheter was passed through peel-away  sheath, tunneled peripherally, secured with nylon sutures.  The wound  closed with Vicryl in subcuticular fashion.  Sterile dressing applied.  The patient was taken to recovery in satisfactory condition.      Nelda Severe Kellie Simmering, M.D.  Electronically Signed     JDL/MEDQ  D:  12/25/2007  T:  12/26/2007  Job:  [id number]

## 2010-06-21 NOTE — H&P (Signed)
NAMEALEXANDAR, [name] NO.:  [id number]   MEDICAL RECORD NO.:  [id number]          PATIENT TYPE:  INP   LOCATION:  2915                         FACILITY:  [place]   PHYSICIAN:  Sharlet Salina, M.D.   DATE OF BIRTH:  August 30, 1970   DATE OF ADMISSION:  12/19/2007  DATE OF DISCHARGE:                              HISTORY & PHYSICAL   CHIEF COMPLAINT:  Chest pain, shortness of breath, chills and unable to  walk secondary to swelling in his legs.   HISTORY OF PRESENT ILLNESS:  The patient is a 40 year old African  American male with past medical history significant for diabetes,  hypertension, dyslipidemia, chronic kidney disease, he says he has spina  bifida as well.   The patient  The patient presented to the emergency room secondary to he  said his legs have been swollen over the past 9 months.  He is barely  able to walk.  He has been having chills and chest pain, shortness of  breath,  even when the thermostat turned up to the 80 silk, he is still  cold.  He has also been having some nausea and vomiting for the past 2  days.  His legs are swollen up.  He stated that he went to [facility] The last time he went there, his creatinine was between 6 and  7.  He was told that he needs dialysis, that his creatinine clearance  was less than 30%.  He was told that he needed to get a fistula.  He has  been given Lasix every time he goes to the emergency room.  He said he  has diabetic retinopathy.  He can barely see. He needs to have surgery  done on his eyes.  He stated the last time he was in the ER at [facility]  was about a week ago.  They gave him insulin and Lasix and sent him  home.   PAST MEDICAL HISTORY:  Significant for:  1. Hypertension.  2. Dyslipidemia.  3. Chronic kidney disease.  4. Edema,  5. Diabetic retinopathy.  6. Spina bifida.   FAMILY HISTORY:  Mother deceased.  She died from diabetes at 100.  Father's history is not known.   SOCIAL HISTORY:   The patient does not drink alcohol.  He does not smoke.  He has two children.  He is disabled.   MEDICATIONS:  He is unsure of the dosage of his medications, but he  knows he takes:  1. Lasix 80 mg two in the morning.  2. Lisinopril twice a day.  3. Labetalol twice a day and another medication.  4. 70/30 insulin 10 units with breakfast and sliding scale insulin.   ALLERGIES:  No known drug allergies.   REVIEW OF SYSTEMS:  Negative; otherwise in HPI.   PHYSICAL EXAMINATION:  VITAL SIGNS:  Temperature 102.6, blood pressure  216/119, pulse of 118, respirations 22, pulse ox 93% on room air.  GENERAL:  Patient is sitting up on stretcher.  Does not seem to be in  any acute distress.  HEENT:  Head is normocephalic, atraumatic.  Pupils reactive  to light  without erythema.  CARDIOVASCULAR:  Tachycardiac.  LUNGS:  Decreased breath sounds at the bases.  ABDOMEN:  Positive bowel sounds.  EXTREMITIES:  3+ edema bilaterally.   LABORATORY DATA:  WBC 17.7, hemoglobin 8.8, MCV 86.1, platelets 318,000.  Lactic acid 1.9.  Urinalysis:  Greater than 300 protein ISTAT:  Sodium  137, potassium 5, chloride 109, glucose 342, BUN 87, creatinine 8.3.  Chest x-ray shows mild CHF.  No pneumonia.  EKG shows sinus tachycardia,  otherwise normal.   ASSESSMENT/PLAN:  1. Chronic kidney disease, end-stage renal disease.  2. Diabetes.  3. Hypertension.  4. Dyslipidemia.  5. Edema.  6. Diabetic retinopathy.  7. Pleuritic chest pain.  8. Dizzy.  9. Fever.  10.Leukocytosis.  11.Anemia.   Will admit the patient to the hospital.  Discussed with Dr.  Moshe Cipro.  Will start Lasix 160 IV q.8 h.  Will  continue on the  nitro drip for the blood pressure control.  Will start him on Rocephin,  Zithromax and Tamiflu.  Will get blood cultures x2.  Will repeat his  chest x-ray and EKG in the morning.  Will put him on Lantus and sliding  scale for his diabetes.  Will check a fasting lipid panel as well and  BNP,   hemoglobin A1c, 2-D echo, anemia panel, renal ultrasound.      Sharlet Salina, M.D.  Electronically Signed     NJ/MEDQ  D:  12/19/2007  T:  12/20/2007  Job:  [id number]

## 2010-06-21 NOTE — Group Therapy Note (Signed)
[name], [name]             ACCOUNT NO.:  [id number]   MEDICAL RECORD NO.:  [id number]          PATIENT TYPE:  INP   LOCATION:  2915                         FACILITY:  [facility]   PHYSICIAN:  Neysa Bonito, MD  DATE OF BIRTH:  [birth year]                                 PROGRESS NOTE   INTERIM DISCHARGE SUMMARY:   PRIMARY CARE PHYSICIAN:  Unassigned.  The patient is following up with [facility] for his medical needs.   CHIEF COMPLAINT:  Chest pain, shortness of breath, chills and inability to walk secondary  to swelling in his legs on December 19, 2007.   HOSPITAL COURSE:  The patient was found to have fluid overload with evidence of pulmonary  edema.  The patient remained in the Intensive Care Unit and he was  started on nitro drip.  Currently the patient is improved with high dose  of Lasix.  The patient needed dialysis, but he refused in the few first  days the Diatek catheter placement.  For the last 2 days the patient  agreed to the [facility] catheter insertion and this will be done as planned  for tomorrow, December 25, 2007.  Afterwards,, the patient would  continue with hemodialysis as recommended by the nephrologist, Dr.  McKinley/Dr. Moshe Cipro.   1. The patient has multiple social issues and at one time the patient      was combative and angry with the nurse.  He was threatening to sign      against medical advice and at that time psychiatric evaluation was      sought.  Psychiatric assessment concluded that the patient has      minimal insight into his medical problem and he does not have the      capacity to sign against medical advice.  I suspect the psych      evaluation would need to be followed up with the question if the      patient would benefit from antipsychotic medication.  That being      said, the patient currently is very calm and showing reasonable      approach to his medical problem.  2. The patient has multiple social issues  apparently affecting his      medical decision adversely.  The patient is homeless currently and      he stated he was sleeping in his car.  That is why he refused the      Diatek catheter.  I requested the clinical social worker to start      on placement since she will be with the catheter with high      liability for infection.  The patient also has a court hearing      scheduled this week and in the next, and since he is here and will      not be able to leave the hospital with his condition, we requested      the social worker to arrange what is needed with the court      registry.  My understanding is the clinical social worker has been  talking to his aunt frequently.  3. Diabetes.  On a sliding scale with acceptable control.  4. Anemia, felt secondary to chronic kidney disease.  5. Hypertension.  The patient was an nitro, which was started      secondary to the pulmonary congestion and the shortness of breath.      Currently the patient is off of the nitro drip and he will be      stable enough to be transferred to telemetry floor.   DISCHARGE DIAGNOSES:  1. Hypertension.  2. Dyslipidemia.  3. 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  4. Bilateral lower extremities edema.  5. Diabetic retinopathy.  6. As per history spina bifida.   HOSPITAL CONSULTATION:  1. Nephrology service.  Consultation kindly provided by Louis Meckel, M.D.  The patient is being followed by Dr. Mercy Moore.  2. Psych consultation.   HOSPITAL IMAGING:  On December 19, 2007 the patient had a chest x-ray showing cardiomegaly  is increased since prior study.  New diffuse interstitial infiltrate as  are seen, consistent with interstitial edema.  There is no evidence of  pulmonary consolidation or pleural effusion.  Low lung volumes are seen.  On December 20, 2007 the patient had chest x-ray.  Impression is  progression of bilateral air space  disease most likely due to pulmonary  edema.  On December 20, 2007 the patient had renal ultrasound.  Impression is medical renal disease change on both kidneys without mass  or hydronephrosis.  A small amount of ascites and bilateral pleural  effusions.  On December 23, 2007 chest x-ray impression is pulmonary edema with  probable bilateral pleural effusions.  The patient had a 2-D echo on December 20, 2007.  Impression and summary  is the left ventricle was mildly dilated.  Overall left ventricular  systolic function was at the lower limits of normal.  Left ventricular  ejection fraction was estimated to be 55%.  The aortic valve was mildly  calcified.  There was mild amount of  valvular regurgitation.  The  effective orifice of mitral regurgitation by proximal iso-velocity  surface area was 0.08 cm  squared.  The volume of mitral regurgitation  by proximal iso-velocity surface area was 9 mL.  Small posterior  pericardial effusion is noted.   Discharge diagnoses and discharge medication will be dictated on the day  of acid actual discharge.      Neysa Bonito, MD  Electronically Signed     EME/MEDQ  D:  12/24/2007  T:  12/24/2007  Job:  [id number]

## 2010-06-21 NOTE — Op Note (Signed)
NAMETAGEN, NIX             ACCOUNT NO.:  [id number]   MEDICAL RECORD NO.:  [id number]          PATIENT TYPE:  INP   LOCATION:  6732                         FACILITY:  [facility]   PHYSICIAN:  Judeth Cornfield. Scot Dock, M.D.DATE OF BIRTH:  07/23/1970   DATE OF PROCEDURE:  12/30/2007  DATE OF DISCHARGE:                               OPERATIVE REPORT   PREOPERATIVE DIAGNOSIS:  End-stage renal disease   POSTOPERATIVE DIAGNOSIS:  End-stage renal disease.   PROCEDURE:  Placement of left forearm arteriovenous fistula.   SURGEON:  Judeth Cornfield. Scot Dock, MD   ANESTHESIA:  General.   TECHNIQUE:  The patient was taken to the operating room and sedated by  Anesthesia.  The left upper extremity was prepped and draped in the  usual sterile fashion.  An oblique incision was made over the cephalic  vein and radial artery at the left wrist.  Cephalic vein was dissected  free, it was ligated distally and irrigated up with heparinized saline  and took a 3.5-mm dilator.  The radial artery was dissected free beneath  the fascia.  The patient was heparinized.  The radial artery was clamped  proximally and distally and a longitudinal arteriotomy was made.  The  vein was mobilized over and sewn end-to-side to the artery using  continuous 6-0 Prolene suture.  At completion, there was a good thrill  in the fistula.  There was one other branch further centrally which I  clipped.  Hemostasis was obtained in the wound.  The wound was closed  with deep layer of 3-0 Vicryl.  The skin closed with 4-0 Vicryl.  A  sterile dressing was applied.  The patient tolerated the procedure well  and was transferred to the recovery room in satisfactory condition.  All  needle and sponge counts were correct.      Judeth Cornfield. Scot Dock, M.D.  Electronically Signed     CSD/MEDQ  D:  12/30/2007  T:  12/30/2007  Job:  [id number]

## 2010-06-24 NOTE — Discharge Summary (Signed)
[name], [name]                         ACCOUNT NO.:  [id number]   MEDICAL RECORD NO.:  [id number]                   PATIENT TYPE:  INP   LOCATION:  [id number]                                 FACILITY:  [facility]   PHYSICIAN:  Benito Mccreedy, M.D.             DATE OF BIRTH:  05/25/1970   DATE OF ADMISSION:  08/28/2003  DATE OF DISCHARGE:  08/30/2003                                 DISCHARGE SUMMARY   DISCHARGE DIAGNOSES:  1. Diabetic ketoacidosis resolved.  2. Uncontrolled type 1 diabetes.  __________ recommended.  3. Dehydration and hyponatremia, resolved secondary to gastrointestinal     loss.   DISCHARGE MEDICATIONS:  NPH 70/30, 20 units q.a.m. and 15 units q.p.m. as  previously.  He is to follow up with [employer] for adjustment  in his diabetic medications as needed.  The patient does not have a primary  care physician and is advised to seek someone for control of his diabetes.   DISCHARGE LABORATORY DATA:  WBC 13.6, hemoglobin 13.4, hematocrit 38.9,  platelet count 242. Sodium 132, potassium 3.6, chloride 105, CO2 22, glucose  219, BUN 15, creatinine 0.9, total bilirubin 0.8, alk phos 87, SGOT 33, SGPT  35, total protein 6, albumin 2.6. calcium 8.5.   CONSULTATIONS:  Not applicable.   PROCEDURE:  Not applicable.   CONDITION ON DISCHARGE:  Improved.   REASON FOR ADMISSION:  Diabetic ketoacidosis.   The patient  is an 40 year old gentleman with history of type 1 diabetes who  presented today saying he had generalized weakness, nausea and vomiting of  two days duration.  He had run out of  his insulin and had been able to take  any insulin prior to admission.  He was admitted with hyperglycemia with  decreased bicarbonate and increased anion gap consistent with diabetic  ketoacidosis.  Please see admitting note dictated by me dictated August 29, 2003 for the details regarding patient's presenting symptoms, signs, and  evaluation at the time of discharge.   HOSPITAL COURSE:  The patient was admitted to the hospitalist service but on  telemetry monitoring.  There were no significant arrhythmias.  He received  IV fluid with normal saline and IV regular insulin, recommended protocol.  His electrolytes were aggressively monitored and corrected appropriately.  With these measures, his anion gap closed with correction of his dehydration  and the patient was prepared for discharge today.  Around this morning, Mr.  Tatar feels fine.  He has increased energy.  He is back to his baseline.  Does not have any complaints of fever or chills.  Does not have any cough or  sputum production.  He does not have any chest pains.  He is not short of  breath.   His temperature was 98.1, pulse 81, respiratory rate 20.  His CBG was 210  mg/dl, BP of 114/63.  He does not have any JVD.  His mucous membranes are  moist.  LUNGS:  Clear to auscultation.  CARDIAC:  Regular rate and rhythm and without gallops or murmur.  ABDOMEN:  Soft, nontender.  EXTREMITIES: Does not have any edema.  NEUROLOGIC:  Alert and oriented x3, no acute focal deficit.   The patient is being discharged home today with followup [facility] in 7-10 days.  He is to call for appointment.  He is also to call  for appointment for follow up at diabetes outpatient center.                                               Benito Mccreedy, M.D.    GO/MEDQ  D:  08/30/2003  T:  08/30/2003  Job:  [id number]   cc:   [employer]

## 2010-06-24 NOTE — H&P (Signed)
NAMELADANIAN, GUZOWSKI NO.:  [id number]   MEDICAL RECORD NO.:  [id number]                   PATIENT TYPE:  EMS   LOCATION:  ED                                   FACILITY:  [facility]   PHYSICIAN:  Benito Mccreedy, M.D.             DATE OF BIRTH:  10-29-1970   DATE OF ADMISSION:  08/28/2003  DATE OF DISCHARGE:                                HISTORY & PHYSICAL   CHIEF COMPLAINT:  Generalized weakness, nausea, and vomiting.   HISTORY OF PRESENT ILLNESS:  The patient is a 40 year old African-American  gentleman with history of diabetes mellitus type 1 who presents with a 2-day  history of generalized weakness, nausea, and vomiting.  According to the  patient, he has not been able to take his insulin 2 days prior to the onset  of his ailment on account of, apparently, financial reasons.  He denies any  history of fever, chills, shortness of breath, cough, sputum production,  chest pain, PND, orthopnea, abdominal pain, diarrhea, constipation, dysuria,  frequency of micturition.  He also denies any visual changes.  On arrival at  the ED the patient was noted to be hyperglycemic and was given IV normal  saline with a bolus and Glucomander for IV regular insulin was initiated and  medicine was called to evaluate for admission.   PAST MEDICAL HISTORY:  Notable for history of type 1 diabetes mellitus.   MEDICATION HISTORY:  NPH 70/30 25 units in the morning and 50 units at  night.   FAMILY HISTORY:  Positive for diabetes mellitus.  No history of strokes or  heart disease.   ALLERGIES:  He is not allergic to any medications.   SOCIAL HISTORY:  The patient does not use any alcohol, does not smoke  cigarettes, or illicit drugs.  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   REVIEW OF SYSTEMS:  Significant positives and negatives are as noted in the  HPI above.  Otherwise, review of systems  unremarkable.   PHYSICAL EXAMINATION:  VITAL SIGNS:  Blood pressure was 133/78, pulse was  92, respiratory rate of 20, temperature of 97.5 degrees Fahrenheit, O2  saturation of 99% on room air.  GENERAL:  He was not in acute cardiopulmonary distress.  HEENT:  Normocephalic, atraumatic.  Pupils were equal, round, react to  light.  Extraocular movements were intact.  Oropharynx was dry.  No  exudation or erythema was appreciated.  NECK:  Supple, no JVD.  No thyromegaly.  LUNGS:  Vesicular breath sounds without any crackles or wheezes.  ABDOMEN:  Soft, nontender, bowel sounds were present, were normal active.  EXTREMITIES:  Negative for any edema, no clubbing.  NEUROLOGIC:  Alert and oriented x3, no acute focal deficits.   LABORATORY DATA:  Wbc count 16.0, hemoglobin 16.0, hematocrit 47.9, MCV  88.2, platelet count 270.  Sodium 131,  potassium 6.6, chloride is 84, CO2 is  13, glucose is 787, BUN 32, creatinine 2.0, total bilirubin 2.2, alkaline  phosphatase 155, SGOT 80, SGPT 54, total protein 7.8, albumin 3.8, calcium  9.2.  UA notable for more than 1000 mg/dl of glucosuria with ketones more  than 80 mg/dl and no evidence of urinary tract infection.   ASSESSMENT:  A 40 year old gentleman with history of type 1 diabetes who  presents with generalized weakness, nausea, and vomiting 2 days after  running out of his insulin.  He has hyperglycemia with decreased bicarbonate  and increased anion gap.   IMPRESSION:  1. Diabetic ketoacidosis secondary to medication noncompliance.  2. Hyperglycemia secondary to diabetic ketoacidosis.  3. Leukocytosis, likely stress margination; no obvious focus of infection.  4. Dehydration secondary to gastrointestinal loss.  5. Hyponatremia.  The patient's corrected sodium is still less than 135     mEq/L.  6. Hyperbilirubinemia with elevated alkaline phosphatase and transaminitis     without any abdominal signs on examination.   PLAN:  The patient will be  admitted to telemetry bed.  He will receive IV  fluid supplementation with normal saline and subsequently switched to D5  based fluid when his glucose levels come down to within 200 mg/dl.  He will  receive IV insulin per Glucomander protocol.  We will monitor his  electrolytes, especially his potassium which is expected to the normal  limits or may end up actually being hypokalemic with appropriate insulin  administration.  Will also monitor his renal function which seems to be  secondary to his dehydration.  Will monitor his liver function testing and  his leukocytes as well.  We will check a chest x-ray.  The patient denies  any history of alcohol abuse; however, his transaminases are elevated.  We  will check a urine drug screen as well.  Hopefully, this patient will be  able to be discharged from the hospital in 48-72 hours.                                               Benito Mccreedy, M.D.    GO/MEDQ  D:  08/29/2003  T:  08/29/2003  Job:  [id number]

## 2010-06-24 NOTE — Consult Note (Signed)
NAMEWILLS, ELMENDORF NO.:  [id number]   MEDICAL RECORD NO.:  [id number]                   PATIENT TYPE:  EMS   LOCATION:  ED                                   [facility]ACILITY:  [facility]   PHYSICIAN:  Fenton Malling. Lucia Gaskins, M.D.               DATE OF BIRTH:  1970/12/18   DATE OF CONSULTATION:  03/31/2002  DATE OF DISCHARGE:                                   CONSULTATION   REASON FOR CONSULTATION:  Abscess, right second toe.   HISTORY OF PRESENT ILLNESS:  [name] is a 40 year old black male, who  about 11 years ago was found to be diabetic.  He is insulin-dependent.  He  unfortunately has no primary medical doctor, came to the [facility]  Emergency Room with a right toe, after messing with an ingrown toenail for 2-  3 days, had swelling of the right toe with tenderness.  He was unassigned,  but [employer]. Tama Gander, M.D. saw him and asked me to see him for his toe.   He says he has had one other infection in the past, but this resolved  spontaneously.  He has never had any amputations or further problems with  his feet, and he denies any neuropathy in his extremities.   PAST MEDICAL HISTORY:  1. He has no allergies.  2. His only medicine include Humulin NPH of about 12 units b.i.d. and     insulin R b.i.d. at 12 units.  3. He checks his blood sugars sort of irregularly.  He is not sure when his     last hemoglobin A1c was but thinks within the last couple of months.  4. He also says his last tetanus shot was within the last couple of months.   ALLERGIES:  None.   REVIEW OF SYSTEMS:  Negative for pulmonary, cardiac, gastrointestinal, or  urologic problems.  He is accompanied by his fiancee in the emergency room.   PHYSICAL EXAMINATION:  VITAL SIGNS:  Temperature 98.2, pulse 109, blood  pressure 143/80, respirations 20.  GENERAL:  He is a well-nourished, black male, alert, pleasant on physical  exam.  RIGHT LEG:  Exam is really limited to his right leg.  He has  easily palpable  dorsalis pedis and posterior tibial pulses.  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  His second digit is swollen.  There is a pustule  area lateral to his toenail.   IMPRESSION:  1. My impression is that [name] has a right toe abscess.  He has been     given antibiotics in the emergency room with clindamycin and Tequin by     Earlie Server. Tama Gander, M.D. and will be sent home on Keflex which seems     appropriate, and he needs an incision  and drainage of the right toe     infection.  2. Diabetes mellitus.  He needs somebody locally, a primary medical doctor     or endocrinologist who will follow him.  I stressed this to the patient     and his girlfriend.  I think she understands pretty well.  3. We will give him follow up with the foot clinic, which today is Monday,     will be in two days for them to recheck     his toe, and then I can either see him back in the office or he can be     followed by the foot clinic.  He is given a foot brace to keep weight off     of his forefeet.  He was given a cane and some gauze to help dressing.     He is supposed to soak his foot 3-4 times a day in addition to taking the     antibiotics.                                               Fenton Malling. Lucia Gaskins, M.D.    DHN/MEDQ  D:  03/31/2002  T:  03/31/2002  Job:  [id number]   cc:   Earlie Server. Tama Gander, M.D.  [place] [address] [place] [address]  Fax: [phone]

## 2010-06-24 NOTE — Consult Note (Signed)
[name], [name]                         ACCOUNT NO.:  [id number]   MEDICAL RECORD NO.:  [id number]                   PATIENT TYPE:  REC   LOCATION:  FOOT                                 FACILITY:  [facility]   PHYSICIAN:  Orlando Penner. London Pepper, M.D.              DATE OF BIRTH:  Apr 08, 1970   DATE OF CONSULTATION:  04/03/2002  DATE OF DISCHARGE:                                   CONSULTATION   HISTORY:  This 40 year old black male was referred by the [facility] Emergency Room for ongoing care of an abscessed right second toe.   The patient has had type 1 diabetes for a number of years and is in  chronically poor control.  He was unfamiliar with the term A1c and is not  aware of what that may have been in the past.  His blood sugar, when he was  seen in the [facility] Emergency Room, was 494 mg/dL.  Whatever, he has not  had previous difficulties with his feet with no prior ulcerations, etc.  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   With that background history, the patient had noted that the nail of his  right second toe, which is a hammertoe, seem to be ingrown and associated  with some redness.  Accordingly, he attempted to dig this out at home with  a nail clipper and then to clip the nail off itself.  In so doing, he  noticed that there was a pocket beneath the nail from which some purulence  drained.  This situation worsened with great redness and swelling of the toe  over the next several days.  [facility]  At that time, the toe was incised and drained distally,  apparently with pus obtained, and he was given IV Tequin and clindamycin.  He was given a prescription to continue with oral cefalexin and was to  follow up here.   Apparently, the patient never obtained the cefalexin saying that he does not  have the economic resources to do  so.  Accordingly, he has been on no  further antibiotics since those administered in the emergency room some 48  hours ago.  Despite this, he feels that the toe looks better now that it did  at the time of that presentation, although there is persistence of some  spreading erythema.   He is now here now for continued care.   PAST MEDICAL HISTORY:  Really, except for those things in the present  illness, no known significant history.  He has no known medicinal allergies.   MEDICATIONS:  He takes NPH insulin 25 units twice daily and Regular Insulin  12 units twice daily.  He is not currently monitoring his blood sugars.   PHYSICAL EXAMINATION:  EXTREMITIES:  Examination today is limited to the  distal lower extremities.  These do show some pes planus and forefoot valgus  deformity bilaterally and interestingly the heel cords appear to be  significantly shortened bilaterally.  The toes are strikingly clawed and  with frank hammer-toe formation on multiple of his lesser digits  bilaterally.   The second toe on the right is somewhat swollen and there is hemorrhagic  discoloration just proximal to the nail on the dorsal aspect of the toe.  At  the same site, there are scars from the recent I&D.  Extending from this toe  up under the dorsum of the foot, approximately 4-5 cm from the base of the  toe is some degree of limited redness, but there is not unusual warmth here  and this is not the fiery demarcation that is sometimes seen with spreading  infection.  Nonetheless, it would appear that there has been some  lymphangitic spread, at least to this extent.   The toe itself does not appear fluctuant and is nontender.   DISPOSITION:  1. The patient is given general instruction regarding foot care in the face     of diabetes by video with nurse and physician reinforcement.  2. Arrangements were made to get him established with [facility]     to try to bring his diabetes under  better control.  3. The patient is given samples of Augmentin XR 1000 mg and instructed to     take two immediately this morning, one later in the day, and thereafter 1     twice daily with food at roughly 12 hour intervals.  4. Followup visit will be to this clinic in six days.  In the meantime, the     patient is advised to stay off his feet as much as possible, to keep his     foot elevated, and to notify us immediately if there appears to be     worsening rather than improvement.  5. It is anticipated that unless there is rapid healing, there may well be     an indication for bone scan or other evaluation to exclude osteomyelitis     in this toe.                                               Orlando Penner. London Pepper, M.D.    RES/MEDQ  D:  04/03/2002  T:  04/03/2002  Job:  [id number]   cc:   [name]

## 2010-06-24 NOTE — Op Note (Signed)
   [name], [name] NO.:  [id number]   MEDICAL RECORD NO.:  [id number]                   PATIENT TYPE:  EMS   LOCATION:  ED                                   FACILITY:  [facility]   PHYSICIAN:  Fenton Malling. Lucia Gaskins, M.D.               DATE OF BIRTH:  1970-08-12   DATE OF PROCEDURE:  DATE OF DISCHARGE:                                 OPERATIVE REPORT   PREOPERATIVE DIAGNOSES:  Abscess/infection right second toe.   POSTOPERATIVE DIAGNOSES:  Abscess to the right second toe.   PROCEDURE:  Incision and drainage of right second toe abscess.   SURGEON:  Fenton Malling. Lucia Gaskins, M.D.   ANESTHESIA:  About 3 mL of 1% Xylocaine plain.   COMPLICATIONS:  None.   PROCEDURE:  The patient is a 40 year old black male who presented to the  [facility] Emergency Room with infection in his right great toe.  He is a  diabetic, but has no primary medical doctor.  He has been seeing Earlie Server.  Tama Gander, M.D. in consultation.  While the patient was in the emergency room I  painted his toe with Betadine solution.  I infiltrated the base of the toe  with about 3 mL of 1% Xylocaine.  Even though he says he has no neuropathy,  actually, I saw him flinch very little for injecting this area.  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  I then made two linear  incisions, one lateral to the toenail of the second digit, one on top of his  toe.  I got some pus out of the area in the lateral.  I just got mainly  fluid and blood out of the top.  I cleaned this with Betadine, wrapped it  with 4x4s.  He has a shoe to go home with.  He is going to be seen in the  foot clinic in two days or get back in touch with our office.  He has Keflex  as antibiotics.  He is supposed to soak it two or three times a day.  Call  for any problems.                                               Fenton Malling. Lucia Gaskins, M.D.    DHN/MEDQ  D:  03/31/2002  T:  03/31/2002  Job:  [id number]

## 2010-11-02 LAB — BASIC METABOLIC PANEL
BUN: 28 — ABNORMAL HIGH
CO2: 25
Calcium: 8.7
Chloride: 103
Creatinine, Ser: 2.1 — ABNORMAL HIGH
GFR calc Af Amer: 44 — ABNORMAL LOW
GFR calc non Af Amer: 36 — ABNORMAL LOW
Glucose, Bld: 215 — ABNORMAL HIGH
Potassium: 3.5
Sodium: 137

## 2010-11-02 LAB — CBC
HCT: 24.5 — ABNORMAL LOW
Hemoglobin: 8.5 — ABNORMAL LOW
MCHC: 34.7
MCV: 86.5
Platelets: 174
RBC: 2.83 — ABNORMAL LOW
RDW: 14.3
WBC: 9.4

## 2010-11-02 LAB — URINALYSIS, ROUTINE W REFLEX MICROSCOPIC
Bilirubin Urine: NEGATIVE
Glucose, UA: 500 — AB
Ketones, ur: NEGATIVE
Leukocytes, UA: NEGATIVE
Nitrite: NEGATIVE
Protein, ur: >300 — AB
Specific Gravity, Urine: 1.017
Urobilinogen, UA: 0.2
pH: 7

## 2010-11-02 LAB — POCT I-STAT, CHEM 8
BUN: 34 — ABNORMAL HIGH
Calcium, Ion: 1.17
Chloride: 104
Creatinine, Ser: 2.5 — ABNORMAL HIGH
Glucose, Bld: 218 — ABNORMAL HIGH
HCT: 26 — ABNORMAL LOW
Hemoglobin: 8.8 — ABNORMAL LOW
Potassium: 3.6
Sodium: 139
TCO2: 26

## 2010-11-02 LAB — DIFFERENTIAL
Basophils Absolute: 0
Basophils Relative: 0
Eosinophils Absolute: 0
Eosinophils Relative: 0
Lymphocytes Relative: 9 — ABNORMAL LOW
Lymphs Abs: 0.8
Monocytes Absolute: 0.7
Monocytes Relative: 7
Neutro Abs: 7.8 — ABNORMAL HIGH
Neutrophils Relative %: 84 — ABNORMAL HIGH

## 2010-11-02 LAB — URINE MICROSCOPIC-ADD ON

## 2010-11-08 LAB — CBC
HCT: 22.2 — ABNORMAL LOW
HCT: 24 — ABNORMAL LOW
HCT: 24 — ABNORMAL LOW
HCT: 24.5 — ABNORMAL LOW
HCT: 24.9 — ABNORMAL LOW
HCT: 25.5 — ABNORMAL LOW
HCT: 25.8 — ABNORMAL LOW
HCT: 26.2 — ABNORMAL LOW
HCT: 26.3 — ABNORMAL LOW
HCT: 27.5 — ABNORMAL LOW
HCT: 28.1 — ABNORMAL LOW
Hemoglobin: 7.5 — CL
Hemoglobin: 8 — ABNORMAL LOW
Hemoglobin: 8.1 — ABNORMAL LOW
Hemoglobin: 8.4 — ABNORMAL LOW
Hemoglobin: 8.4 — ABNORMAL LOW
Hemoglobin: 8.6 — ABNORMAL LOW
Hemoglobin: 8.8 — ABNORMAL LOW
Hemoglobin: 8.8 — ABNORMAL LOW
Hemoglobin: 8.8 — ABNORMAL LOW
Hemoglobin: 9.1 — ABNORMAL LOW
Hemoglobin: 9.4 — ABNORMAL LOW
MCHC: 32.6
MCHC: 32.7
MCHC: 33.5
MCHC: 33.5
MCHC: 33.5
MCHC: 33.7
MCHC: 33.8
MCHC: 33.9
MCHC: 34.4
MCHC: 34.5
MCHC: 34.6
MCV: 85.4
MCV: 85.6
MCV: 85.7
MCV: 86
MCV: 86.1
MCV: 87.1
MCV: 87.5
MCV: 87.5
MCV: 87.8
MCV: 88.5
MCV: 90.7
Platelets: 218
Platelets: 247
Platelets: 276
Platelets: 305
Platelets: 315
Platelets: 318
Platelets: 334
Platelets: 370
Platelets: 382
Platelets: 453 — ABNORMAL HIGH
Platelets: 468 — ABNORMAL HIGH
RBC: 2.54 — ABNORMAL LOW
RBC: 2.74 — ABNORMAL LOW
RBC: 2.79 — ABNORMAL LOW
RBC: 2.85 — ABNORMAL LOW
RBC: 2.85 — ABNORMAL LOW
RBC: 2.91 — ABNORMAL LOW
RBC: 2.98 — ABNORMAL LOW
RBC: 3.01 — ABNORMAL LOW
RBC: 3.05 — ABNORMAL LOW
RBC: 3.11 — ABNORMAL LOW
RBC: 3.2 — ABNORMAL LOW
RDW: 14.6
RDW: 14.9
RDW: 15
RDW: 15.4
RDW: 15.5
RDW: 15.5
RDW: 15.6 — ABNORMAL HIGH
RDW: 15.7 — ABNORMAL HIGH
RDW: 15.7 — ABNORMAL HIGH
RDW: 16.1 — ABNORMAL HIGH
RDW: 16.8 — ABNORMAL HIGH
WBC: 10.8 — ABNORMAL HIGH
WBC: 11.1 — ABNORMAL HIGH
WBC: 11.2 — ABNORMAL HIGH
WBC: 12.1 — ABNORMAL HIGH
WBC: 12.2 — ABNORMAL HIGH
WBC: 12.4 — ABNORMAL HIGH
WBC: 12.6 — ABNORMAL HIGH
WBC: 17.7 — ABNORMAL HIGH
WBC: 19.2 — ABNORMAL HIGH
WBC: 22.2 — ABNORMAL HIGH
WBC: 25.6 — ABNORMAL HIGH

## 2010-11-08 LAB — RENAL FUNCTION PANEL
Albumin: 2.2 — ABNORMAL LOW
Albumin: 2.3 — ABNORMAL LOW
Albumin: 2.3 — ABNORMAL LOW
Albumin: 2.6 — ABNORMAL LOW
Albumin: 2.6 — ABNORMAL LOW
Albumin: 2.6 — ABNORMAL LOW
BUN: 100 — ABNORMAL HIGH
BUN: 26 — ABNORMAL HIGH
BUN: 44 — ABNORMAL HIGH
BUN: 57 — ABNORMAL HIGH
BUN: 97 — ABNORMAL HIGH
BUN: 98 — ABNORMAL HIGH
CO2: 19
CO2: 21
CO2: 21
CO2: 26
CO2: 27
CO2: 29
Calcium: 8 — ABNORMAL LOW
Calcium: 8.4
Calcium: 8.6
Calcium: 8.8
Calcium: 8.8
Calcium: 9.2
Chloride: 103
Chloride: 103
Chloride: 104
Chloride: 105
Chloride: 99
Chloride: 99
Creatinine, Ser: 3.86 — ABNORMAL HIGH
Creatinine, Ser: 5.85 — ABNORMAL HIGH
Creatinine, Ser: 5.91 — ABNORMAL HIGH
Creatinine, Ser: 8.05 — ABNORMAL HIGH
Creatinine, Ser: 8.17 — ABNORMAL HIGH
Creatinine, Ser: 8.23 — ABNORMAL HIGH
GFR calc Af Amer: 13 — ABNORMAL LOW
GFR calc Af Amer: 13 — ABNORMAL LOW
GFR calc Af Amer: 22 — ABNORMAL LOW
GFR calc Af Amer: 9 — ABNORMAL LOW
GFR calc Af Amer: 9 — ABNORMAL LOW
GFR calc Af Amer: 9 — ABNORMAL LOW
GFR calc non Af Amer: 11 — ABNORMAL LOW
GFR calc non Af Amer: 11 — ABNORMAL LOW
GFR calc non Af Amer: 18 — ABNORMAL LOW
GFR calc non Af Amer: 7 — ABNORMAL LOW
GFR calc non Af Amer: 7 — ABNORMAL LOW
GFR calc non Af Amer: 8 — ABNORMAL LOW
Glucose, Bld: 160 — ABNORMAL HIGH
Glucose, Bld: 185 — ABNORMAL HIGH
Glucose, Bld: 196 — ABNORMAL HIGH
Glucose, Bld: 229 — ABNORMAL HIGH
Glucose, Bld: 235 — ABNORMAL HIGH
Glucose, Bld: 238 — ABNORMAL HIGH
Phosphorus: 4.2
Phosphorus: 4.8 — ABNORMAL HIGH
Phosphorus: 5 — ABNORMAL HIGH
Phosphorus: 6 — ABNORMAL HIGH
Phosphorus: 6.1 — ABNORMAL HIGH
Phosphorus: 6.3 — ABNORMAL HIGH
Potassium: 3.8
Potassium: 3.8
Potassium: 3.9
Potassium: 3.9
Potassium: 4
Potassium: 4
Sodium: 136
Sodium: 137
Sodium: 137
Sodium: 137
Sodium: 138
Sodium: 139

## 2010-11-08 LAB — BASIC METABOLIC PANEL
BUN: 40 — ABNORMAL HIGH
BUN: 59 — ABNORMAL HIGH
BUN: 82 — ABNORMAL HIGH
BUN: 82 — ABNORMAL HIGH
BUN: 97 — ABNORMAL HIGH
CO2: 19
CO2: 20
CO2: 26
CO2: 26
CO2: 26
Calcium: 8.2 — ABNORMAL LOW
Calcium: 8.5
Calcium: 8.5
Calcium: 8.6
Calcium: 9
Chloride: 100
Chloride: 100
Chloride: 102
Chloride: 102
Chloride: 103
Creatinine, Ser: 5.11 — ABNORMAL HIGH
Creatinine, Ser: 5.71 — ABNORMAL HIGH
Creatinine, Ser: 7.24 — ABNORMAL HIGH
Creatinine, Ser: 7.63 — ABNORMAL HIGH
Creatinine, Ser: 8.24 — ABNORMAL HIGH
GFR calc Af Amer: 10 — ABNORMAL LOW
GFR calc Af Amer: 10 — ABNORMAL LOW
GFR calc Af Amer: 14 — ABNORMAL LOW
GFR calc Af Amer: 16 — ABNORMAL LOW
GFR calc Af Amer: 9 — ABNORMAL LOW
GFR calc non Af Amer: 11 — ABNORMAL LOW
GFR calc non Af Amer: 13 — ABNORMAL LOW
GFR calc non Af Amer: 7 — ABNORMAL LOW
GFR calc non Af Amer: 8 — ABNORMAL LOW
GFR calc non Af Amer: 9 — ABNORMAL LOW
Glucose, Bld: 198 — ABNORMAL HIGH
Glucose, Bld: 200 — ABNORMAL HIGH
Glucose, Bld: 239 — ABNORMAL HIGH
Glucose, Bld: 339 — ABNORMAL HIGH
Glucose, Bld: 48 — ABNORMAL LOW
Potassium: 3.6
Potassium: 3.9
Potassium: 4
Potassium: 4.1
Potassium: 4.8
Sodium: 135
Sodium: 136
Sodium: 139
Sodium: 139
Sodium: 139

## 2010-11-08 LAB — GLUCOSE, CAPILLARY
Glucose-Capillary: 101 — ABNORMAL HIGH
Glucose-Capillary: 102 — ABNORMAL HIGH
Glucose-Capillary: 104 — ABNORMAL HIGH
Glucose-Capillary: 107 — ABNORMAL HIGH
Glucose-Capillary: 107 — ABNORMAL HIGH
Glucose-Capillary: 109 — ABNORMAL HIGH
Glucose-Capillary: 110 — ABNORMAL HIGH
Glucose-Capillary: 110 — ABNORMAL HIGH
Glucose-Capillary: 113 — ABNORMAL HIGH
Glucose-Capillary: 118 — ABNORMAL HIGH
Glucose-Capillary: 121 — ABNORMAL HIGH
Glucose-Capillary: 140 — ABNORMAL HIGH
Glucose-Capillary: 142 — ABNORMAL HIGH
Glucose-Capillary: 143 — ABNORMAL HIGH
Glucose-Capillary: 144 — ABNORMAL HIGH
Glucose-Capillary: 145 — ABNORMAL HIGH
Glucose-Capillary: 147 — ABNORMAL HIGH
Glucose-Capillary: 155 — ABNORMAL HIGH
Glucose-Capillary: 155 — ABNORMAL HIGH
Glucose-Capillary: 155 — ABNORMAL HIGH
Glucose-Capillary: 155 — ABNORMAL HIGH
Glucose-Capillary: 156 — ABNORMAL HIGH
Glucose-Capillary: 166 — ABNORMAL HIGH
Glucose-Capillary: 166 — ABNORMAL HIGH
Glucose-Capillary: 166 — ABNORMAL HIGH
Glucose-Capillary: 170 — ABNORMAL HIGH
Glucose-Capillary: 171 — ABNORMAL HIGH
Glucose-Capillary: 172 — ABNORMAL HIGH
Glucose-Capillary: 172 — ABNORMAL HIGH
Glucose-Capillary: 175 — ABNORMAL HIGH
Glucose-Capillary: 176 — ABNORMAL HIGH
Glucose-Capillary: 176 — ABNORMAL HIGH
Glucose-Capillary: 182 — ABNORMAL HIGH
Glucose-Capillary: 183 — ABNORMAL HIGH
Glucose-Capillary: 187 — ABNORMAL HIGH
Glucose-Capillary: 188 — ABNORMAL HIGH
Glucose-Capillary: 196 — ABNORMAL HIGH
Glucose-Capillary: 196 — ABNORMAL HIGH
Glucose-Capillary: 198 — ABNORMAL HIGH
Glucose-Capillary: 199 — ABNORMAL HIGH
Glucose-Capillary: 204 — ABNORMAL HIGH
Glucose-Capillary: 208 — ABNORMAL HIGH
Glucose-Capillary: 209 — ABNORMAL HIGH
Glucose-Capillary: 215 — ABNORMAL HIGH
Glucose-Capillary: 216 — ABNORMAL HIGH
Glucose-Capillary: 232 — ABNORMAL HIGH
Glucose-Capillary: 236 — ABNORMAL HIGH
Glucose-Capillary: 236 — ABNORMAL HIGH
Glucose-Capillary: 237 — ABNORMAL HIGH
Glucose-Capillary: 237 — ABNORMAL HIGH
Glucose-Capillary: 240 — ABNORMAL HIGH
Glucose-Capillary: 241 — ABNORMAL HIGH
Glucose-Capillary: 248 — ABNORMAL HIGH
Glucose-Capillary: 253 — ABNORMAL HIGH
Glucose-Capillary: 270 — ABNORMAL HIGH
Glucose-Capillary: 272 — ABNORMAL HIGH
Glucose-Capillary: 278 — ABNORMAL HIGH
Glucose-Capillary: 281 — ABNORMAL HIGH
Glucose-Capillary: 284 — ABNORMAL HIGH
Glucose-Capillary: 288 — ABNORMAL HIGH
Glucose-Capillary: 323 — ABNORMAL HIGH
Glucose-Capillary: 325 — ABNORMAL HIGH
Glucose-Capillary: 350 — ABNORMAL HIGH
Glucose-Capillary: 366 — ABNORMAL HIGH
Glucose-Capillary: 377 — ABNORMAL HIGH
Glucose-Capillary: 380 — ABNORMAL HIGH
Glucose-Capillary: 438 — ABNORMAL HIGH
Glucose-Capillary: 459 — ABNORMAL HIGH
Glucose-Capillary: 66 — ABNORMAL LOW
Glucose-Capillary: 76
Glucose-Capillary: 81
Glucose-Capillary: 88
Glucose-Capillary: 90
Glucose-Capillary: 96

## 2010-11-08 LAB — DIFFERENTIAL
Basophils Absolute: 0
Basophils Relative: 0
Eosinophils Absolute: 0.1
Eosinophils Relative: 0
Lymphocytes Relative: 3 — ABNORMAL LOW
Lymphs Abs: 0.5 — ABNORMAL LOW
Monocytes Absolute: 0.6
Monocytes Relative: 4
Neutro Abs: 16.5 — ABNORMAL HIGH
Neutrophils Relative %: 93 — ABNORMAL HIGH

## 2010-11-08 LAB — CK TOTAL AND CKMB (NOT AT ARMC)
CK, MB: 12.1 — ABNORMAL HIGH
Relative Index: 1.2
Total CK: 996 — ABNORMAL HIGH

## 2010-11-08 LAB — POCT I-STAT, CHEM 8
BUN: 87 — ABNORMAL HIGH
Calcium, Ion: 1.03 — ABNORMAL LOW
Chloride: 109
Creatinine, Ser: 8.3 — ABNORMAL HIGH
Glucose, Bld: 342 — ABNORMAL HIGH
HCT: 26 — ABNORMAL LOW
Hemoglobin: 8.8 — ABNORMAL LOW
Potassium: 5
Sodium: 137
TCO2: 18

## 2010-11-08 LAB — COMPREHENSIVE METABOLIC PANEL
ALT: 32
AST: 31
Albumin: 2.7 — ABNORMAL LOW
Alkaline Phosphatase: 103
BUN: 93 — ABNORMAL HIGH
CO2: 20
Calcium: 8.4
Chloride: 109
Creatinine, Ser: 8.3 — ABNORMAL HIGH
GFR calc Af Amer: 9 — ABNORMAL LOW
GFR calc non Af Amer: 7 — ABNORMAL LOW
Glucose, Bld: 154 — ABNORMAL HIGH
Potassium: 4.2
Sodium: 138
Total Bilirubin: 0.5
Total Protein: 6.2

## 2010-11-08 LAB — CULTURE, BLOOD (ROUTINE X 2)
Culture: NO GROWTH
Culture: NO GROWTH
Culture: NO GROWTH

## 2010-11-08 LAB — HEPATIC FUNCTION PANEL
ALT: 27
AST: 27
Albumin: 3 — ABNORMAL LOW
Alkaline Phosphatase: 99
Bilirubin, Direct: <0.1
Total Bilirubin: 0.7
Total Protein: 6.5

## 2010-11-08 LAB — RETICULOCYTES
RBC.: 2.82 — ABNORMAL LOW
Retic Count, Absolute: 39.5
Retic Ct Pct: 1.4

## 2010-11-08 LAB — URINALYSIS, ROUTINE W REFLEX MICROSCOPIC
Bilirubin Urine: NEGATIVE
Glucose, UA: 500 — AB
Ketones, ur: NEGATIVE
Leukocytes, UA: NEGATIVE
Nitrite: NEGATIVE
Protein, ur: >300 — AB
Specific Gravity, Urine: 1.018
Urobilinogen, UA: 0.2
pH: 6

## 2010-11-08 LAB — LIPID PANEL
Cholesterol: 239 — ABNORMAL HIGH
HDL: 45
LDL Cholesterol: 159 — ABNORMAL HIGH
Total CHOL/HDL Ratio: 5.3
Triglycerides: 175 — ABNORMAL HIGH
VLDL: 35

## 2010-11-08 LAB — LACTIC ACID, PLASMA: Lactic Acid, Venous: 1.9

## 2010-11-08 LAB — URINE MICROSCOPIC-ADD ON

## 2010-11-08 LAB — IRON AND TIBC
Iron: 11 — ABNORMAL LOW
Saturation Ratios: 5 — ABNORMAL LOW
TIBC: 226
UIBC: 215

## 2010-11-08 LAB — POCT I-STAT 4, (NA,K, GLUC, HGB,HCT)
Glucose, Bld: 149 — ABNORMAL HIGH
HCT: 24 — ABNORMAL LOW
Hemoglobin: 8.2 — ABNORMAL LOW
Potassium: 4
Sodium: 137

## 2010-11-08 LAB — GLUCOSE, RANDOM: Glucose, Bld: 447 — ABNORMAL HIGH

## 2010-11-08 LAB — TROPONIN I: Troponin I: 0.05

## 2010-11-08 LAB — HEPATITIS B CORE ANTIBODY, TOTAL: Hep B Core Total Ab: NEGATIVE

## 2010-11-08 LAB — HEPATITIS B SURFACE ANTIGEN: Hepatitis B Surface Ag: NEGATIVE

## 2010-11-08 LAB — VITAMIN B12: Vitamin B-12: 647 (ref 211–911)

## 2010-11-08 LAB — HEMOGLOBIN A1C
Hgb A1c MFr Bld: 7.1 — ABNORMAL HIGH
Mean Plasma Glucose: 157

## 2010-11-08 LAB — FERRITIN: Ferritin: 678 — ABNORMAL HIGH (ref 22–322)

## 2010-11-08 LAB — B-NATRIURETIC PEPTIDE (CONVERTED LAB): Pro B Natriuretic peptide (BNP): 2357 — ABNORMAL HIGH

## 2010-11-08 LAB — PTH, INTACT AND CALCIUM
Calcium, Total (PTH): 7.8 — ABNORMAL LOW
PTH: 249.8 — ABNORMAL HIGH

## 2010-11-08 LAB — FOLATE: Folate: >20

## 2010-11-08 LAB — ALT: ALT: 32

## 2010-11-24 ENCOUNTER — Other Ambulatory Visit (HOSPITAL_COMMUNITY): Payer: Medicaid Other

## 2010-11-24 ENCOUNTER — Other Ambulatory Visit (HOSPITAL_COMMUNITY): Payer: Self-pay | Admitting: Oral Surgery

## 2010-11-24 ENCOUNTER — Ambulatory Visit (HOSPITAL_COMMUNITY)
Admission: RE | Admit: 2010-11-24 | Discharge: 2010-11-24 | Disposition: A | Discharge status: Home / Self Care | Payer: Medicaid Other | Source: Ambulatory Visit | Attending: Oral Surgery | Admitting: Oral Surgery

## 2010-11-24 ENCOUNTER — Encounter (HOSPITAL_COMMUNITY)
Admission: RE | Admit: 2010-11-24 | Discharge: 2010-11-24 | Disposition: A | Discharge status: Home / Self Care | Payer: Medicaid Other | Source: Ambulatory Visit | Attending: Oral Surgery | Admitting: Oral Surgery

## 2010-11-24 DIAGNOSIS — Z01810 Encounter for preprocedural cardiovascular examination: Secondary | ICD-10-CM | POA: Insufficient documentation

## 2010-11-24 DIAGNOSIS — Z01818 Encounter for other preprocedural examination: Secondary | ICD-10-CM | POA: Insufficient documentation

## 2010-11-24 DIAGNOSIS — N186 End stage renal disease: Secondary | ICD-10-CM

## 2010-11-24 DIAGNOSIS — Z01812 Encounter for preprocedural laboratory examination: Secondary | ICD-10-CM | POA: Insufficient documentation

## 2010-11-24 LAB — SURGICAL PCR SCREEN
MRSA, PCR: POSITIVE — AB
Staphylococcus aureus: POSITIVE — AB

## 2010-11-29 ENCOUNTER — Ambulatory Visit (HOSPITAL_COMMUNITY): Admission: RE | Admit: 2010-11-29 | Payer: Medicaid Other | Source: Ambulatory Visit | Admitting: Oral Surgery

## 2011-01-03 ENCOUNTER — Encounter (HOSPITAL_COMMUNITY): Payer: Self-pay | Admitting: Pharmacy Technician

## 2011-01-10 ENCOUNTER — Encounter (HOSPITAL_COMMUNITY): Payer: Self-pay

## 2011-01-10 ENCOUNTER — Encounter (HOSPITAL_COMMUNITY)
Admission: RE | Admit: 2011-01-10 | Discharge: 2011-01-10 | Disposition: A | Discharge status: Home / Self Care | Payer: Medicaid Other | Source: Ambulatory Visit | Attending: Oral Surgery | Admitting: Oral Surgery

## 2011-01-10 HISTORY — DX: Schizoaffective disorder, unspecified: F25.9

## 2011-01-10 HISTORY — DX: Schizoaffective disorder, bipolar type: F25.0

## 2011-01-10 HISTORY — DX: Reserved for inherently not codable concepts without codable children: IMO0001

## 2011-01-10 HISTORY — DX: Thyrotoxicosis, unspecified without thyrotoxic crisis or storm: E05.90

## 2011-01-10 HISTORY — DX: Essential (primary) hypertension: I10

## 2011-01-10 HISTORY — DX: Depression, unspecified: F32.A

## 2011-01-10 HISTORY — DX: Major depressive disorder, single episode, unspecified: F32.9

## 2011-01-10 HISTORY — DX: Quadriplegia, unspecified: G82.50

## 2011-01-10 LAB — BASIC METABOLIC PANEL
BUN: 24 mg/dL — ABNORMAL HIGH (ref 6–23)
CO2: 29 mEq/L (ref 19–32)
Calcium: 8.4 mg/dL (ref 8.4–10.5)
Chloride: 99 mEq/L (ref 96–112)
Creatinine, Ser: 4 mg/dL — ABNORMAL HIGH (ref 0.50–1.35)
GFR calc Af Amer: 20 mL/min — ABNORMAL LOW (ref >90)
GFR calc non Af Amer: 17 mL/min — ABNORMAL LOW (ref >90)
Glucose, Bld: 140 mg/dL — ABNORMAL HIGH (ref 70–99)
Potassium: 3.7 mEq/L (ref 3.5–5.1)
Sodium: 140 mEq/L (ref 135–145)

## 2011-01-10 LAB — CBC
HCT: 28 % — ABNORMAL LOW (ref 39.0–52.0)
Hemoglobin: 9.3 g/dL — ABNORMAL LOW (ref 13.0–17.0)
MCH: 32.3 pg (ref 26.0–34.0)
MCHC: 33.2 g/dL (ref 30.0–36.0)
MCV: 97.2 fL (ref 78.0–100.0)
Platelets: 212 10*3/uL (ref 150–400)
RBC: 2.88 MIL/uL — ABNORMAL LOW (ref 4.22–5.81)
RDW: 15.4 % (ref 11.5–15.5)
WBC: 4.5 10*3/uL (ref 4.0–10.5)

## 2011-01-10 NOTE — Progress Notes (Signed)
poa to come dos  To sign consent. Pt totally dependant. Has chronic swelling of feet and unable to move them.lives in group home.     ekg and cxr in [place].

## 2011-01-10 NOTE — Pre-Procedure Instructions (Signed)
[place]  01/10/2011   Your procedure is scheduled on:  **01/17/11*  Report to [facility] at 530AM.  Call this number if you have problems the morning of surgery: [phone]   Remember:   Do not eat food:After Midnight.  May have clear liquids: up to 4 Hours before arrival.  Clear liquids include soda, tea, black coffee, apple or grape juice, broth.  Take these medicines the morning of surgery with A SIP OF WATER: norvasc,sensipar,catapress,apresoline,labetalol,protonix,risperdal,renvela   Do not wear jewelry, make-up or nail polish.  Do not wear lotions, powders, or perfumes. You may wear deodorant.  Do not shave 48 hours prior to surgery.  Do not bring valuables to the hospital.  Contacts, dentures or bridgework may not be worn into surgery.  Leave suitcase in the car. After surgery it may be brought to your room.  For patients admitted to the hospital, checkout time is 11:00 AM the day of discharge.   Patients discharged the day of surgery will not be allowed to drive home.  Name and phone number of your driver: group hone  Special Instructions: CHG Shower Use Special Wash: 1/2 bottle night before surgery and 1/2 bottle morning of surgery.   Please read over the following fact sheets that you were given: Pain Booklet and Surgical Site Infection Prevention

## 2011-01-10 NOTE — Progress Notes (Signed)
Current cxr,ekg,echo in [place]

## 2011-01-11 ENCOUNTER — Encounter (HOSPITAL_COMMUNITY): Payer: Self-pay

## 2011-01-11 NOTE — Consult Note (Signed)
Anesthesia:  40 year old male for dental extractions.  History includes ESRD (HD MWF), HTN, hyperlipidemia, IDDM, Schizophrenia, quadroparesis, depression, secondary hyperparathyroidism, and TBI.    I was asked to review preoperative labs showing elevated BUN/Cr, as expected.   He is also anemic with a H/H of 9.3/28.0 which appear stable since April 2011.  He will get an ISTAT day of surgery.  CXR and EKG from 11/24/10 noted.  Plan to proceed if f/u labs acceptable.

## 2011-01-12 NOTE — H&P (Signed)
[name] is a 40 y.o. male patient with [id number] pain.  No diagnosis found.  Past Medical History  Diagnosis Date  . Diabetes mellitus   . Hyperthyroidism   . Quadriparesis (muscle weakness)   . Constipation   . Chronic kidney disease esrd  . Schizo-affective schizophrenia   . Hypertension   . Depression     No current facility-administered medications for this encounter.   Current Outpatient Prescriptions  Medication Sig Dispense Refill  . amLODipine (NORVASC) 10 MG tablet Take 10 mg by mouth daily.        . cinacalcet (SENSIPAR) 30 MG tablet Take 30 mg by mouth daily.        . cloNIDine (CATAPRES) 0.2 MG tablet Take 0.2 mg by mouth 2 (two) times daily.        . hydrALAZINE (APRESOLINE) 100 MG tablet Take 100 mg by mouth 3 (three) times daily.        . hydroxypropyl methylcellulose (ISOPTO TEARS) 2.5 % ophthalmic solution Place 1 drop into both eyes 2 (two) times daily.        . insulin glargine (LANTUS) 100 UNIT/ML injection Inject 20 Units into the skin 2 (two) times daily.        . insulin lispro (HUMALOG) 100 UNIT/ML injection Inject 5 Units into the skin 3 (three) times daily before meals.        Marland Kitchen labetalol (NORMODYNE) 200 MG tablet Take 200 mg by mouth 3 (three) times daily.        Marland Kitchen LORazepam (ATIVAN) 1 MG tablet Take 1 mg by mouth every 8 (eight) hours as needed. For anxiety       . losartan (COZAAR) 100 MG tablet Take 100 mg by mouth daily.        . multivitamin (RENA-VIT) TABS tablet Take 1 tablet by mouth daily.        . nitroGLYCERIN (NITROSTAT) 0.4 MG SL tablet Place 0.4 mg under the tongue every 5 (five) minutes as needed. For chest pain       . pantoprazole (PROTONIX) 40 MG tablet Take 40 mg by mouth daily.        . risperiDONE (RISPERDAL) 2 MG tablet Take 2 mg by mouth 2 (two) times daily.        . sevelamer (RENVELA) 800 MG tablet Take 800 mg by mouth 3 (three) times daily.        . simvastatin (ZOCOR) 20 MG tablet Take 20 mg by mouth at bedtime.          . temazepam (RESTORIL) 15 MG capsule Take 15 mg by mouth at bedtime.        . Wheat Dextrin (BENEFIBER DRINK MIX) PACK Take 1 packet by mouth 2 (two) times daily.         Allergies  Allergen Reactions  . Lactose Intolerance (Gi)    Active Problems:  * No active hospital problems. *   Vitals: There were no vitals taken for this visit. Lab results:No results found for this or any previous visit (from the past 56 hour(s)). Radiology Results: No results found. General appearance: fatigued and slowed mentation Head: Normocephalic, without obvious abnormality, atraumatic Eyes: conjunctivae/corneas clear. PERRL, EOM's intact. Fundi benign. Ears: normal TM's and external ear canals both ears Nose: Nares normal. Septum midline. Mucosa normal. No drainage or sinus tenderness. Throat: dental caries teeth #'s 1, 16, 17, 32 Neck: no adenopathy and supple, symmetrical, trachea midline Resp: diminished breath sounds bilaterally Cardio: regular rate and  rhythm, S1, S2 normal, no murmur, click, rub or gallop Neurologic: Mental status: alertness: lethargic, affect: flat  Assessment:40 yo BM with non-restorable teeth 3's 1, 16, 17, and 32.  Plan: Extract teeth #s 1, 16, 17, 32. General anesthesia. Same day surgery.   Gae Bon 01/12/2011

## 2011-01-13 NOTE — Progress Notes (Signed)
[name], Guardian Advocate for pt, gave verbal consent for surgery (signed in chart). Contact #s: [phone] or [phone]

## 2011-01-17 ENCOUNTER — Encounter (HOSPITAL_COMMUNITY)
Admission: RE | Disposition: A | Discharge status: Home / Self Care | Payer: Self-pay | Source: Ambulatory Visit | Attending: Oral Surgery

## 2011-01-17 ENCOUNTER — Ambulatory Visit (HOSPITAL_COMMUNITY)
Admission: RE | Admit: 2011-01-17 | Discharge: 2011-01-17 | Disposition: A | Discharge status: Home / Self Care | Payer: Medicaid Other | Source: Ambulatory Visit | Attending: Oral Surgery | Admitting: Oral Surgery

## 2011-01-17 ENCOUNTER — Ambulatory Visit (HOSPITAL_COMMUNITY): Payer: Medicaid Other | Admitting: Vascular Surgery

## 2011-01-17 ENCOUNTER — Encounter (HOSPITAL_COMMUNITY): Payer: Self-pay | Admitting: *Deleted

## 2011-01-17 ENCOUNTER — Encounter (HOSPITAL_COMMUNITY): Payer: Self-pay | Admitting: Vascular Surgery

## 2011-01-17 DIAGNOSIS — I120 Hypertensive chronic kidney disease with stage 5 chronic kidney disease or end stage renal disease: Secondary | ICD-10-CM | POA: Insufficient documentation

## 2011-01-17 DIAGNOSIS — E119 Type 2 diabetes mellitus without complications: Secondary | ICD-10-CM | POA: Insufficient documentation

## 2011-01-17 DIAGNOSIS — G839 Paralytic syndrome, unspecified: Secondary | ICD-10-CM | POA: Insufficient documentation

## 2011-01-17 DIAGNOSIS — N186 End stage renal disease: Secondary | ICD-10-CM | POA: Insufficient documentation

## 2011-01-17 DIAGNOSIS — Z01812 Encounter for preprocedural laboratory examination: Secondary | ICD-10-CM | POA: Insufficient documentation

## 2011-01-17 DIAGNOSIS — K029 Dental caries, unspecified: Secondary | ICD-10-CM

## 2011-01-17 HISTORY — PX: MULTIPLE EXTRACTIONS WITH ALVEOLOPLASTY: SHX5342

## 2011-01-17 LAB — GLUCOSE, CAPILLARY: Glucose-Capillary: 96 mg/dL (ref 70–99)

## 2011-01-17 SURGERY — MULTIPLE EXTRACTION WITH ALVEOLOPLASTY
Anesthesia: General | Site: Mouth | Laterality: Bilateral | Wound class: Clean Contaminated

## 2011-01-17 MED ORDER — HYDROMORPHONE HCL PF 1 MG/ML IJ SOLN: 0.2500 mg | INTRAMUSCULAR | Status: DC | PRN

## 2011-01-17 MED ORDER — FENTANYL CITRATE 0.05 MG/ML IJ SOLN
INTRAMUSCULAR | Status: DC | PRN
  Administered 2011-01-17 (×2): 50 ug via INTRAVENOUS

## 2011-01-17 MED ORDER — ONDANSETRON HCL 4 MG/2ML IJ SOLN: 4.0000 mg | Freq: Once | INTRAMUSCULAR | Status: DC | PRN

## 2011-01-17 MED ORDER — ONDANSETRON HCL 4 MG/2ML IJ SOLN
INTRAMUSCULAR | Status: DC | PRN
  Administered 2011-01-17: 4 mg via INTRAVENOUS

## 2011-01-17 MED ORDER — NEOSTIGMINE METHYLSULFATE 1 MG/ML IJ SOLN
INTRAMUSCULAR | Status: DC | PRN
  Administered 2011-01-17: 4 mg via INTRAVENOUS

## 2011-01-17 MED ORDER — LIDOCAINE-EPINEPHRINE 2 %-1:100000 IJ SOLN
INTRAMUSCULAR | Status: DC | PRN
  Administered 2011-01-17: 10 mL

## 2011-01-17 MED ORDER — SODIUM CHLORIDE 0.9 % IR SOLN
Status: DC | PRN
  Administered 2011-01-17: 1000 mL

## 2011-01-17 MED ORDER — CEFAZOLIN SODIUM 1-5 GM-% IV SOLN
INTRAVENOUS | Status: DC | PRN
  Administered 2011-01-17: 1 g via INTRAVENOUS

## 2011-01-17 MED ORDER — PROPOFOL 10 MG/ML IV EMUL
INTRAVENOUS | Status: DC | PRN
  Administered 2011-01-17: 200 mg via INTRAVENOUS

## 2011-01-17 MED ORDER — GLYCOPYRROLATE 0.2 MG/ML IJ SOLN
INTRAMUSCULAR | Status: DC | PRN
  Administered 2011-01-17: .8 mg via INTRAVENOUS

## 2011-01-17 MED ORDER — MEPERIDINE HCL 25 MG/ML IJ SOLN: 6.2500 mg | INTRAMUSCULAR | Status: DC | PRN

## 2011-01-17 MED ORDER — HYDROCODONE-ACETAMINOPHEN 5-500 MG PO CAPS: 1.0000 | ORAL_CAPSULE | ORAL | Status: AC | PRN

## 2011-01-17 MED ORDER — CEFAZOLIN SODIUM 1-5 GM-% IV SOLN
INTRAVENOUS | Status: AC
  Filled 2011-01-17: qty 50

## 2011-01-17 MED ORDER — MORPHINE SULFATE 2 MG/ML IJ SOLN: 0.0500 mg/kg | INTRAMUSCULAR | Status: DC | PRN

## 2011-01-17 MED ORDER — ROCURONIUM BROMIDE 100 MG/10ML IV SOLN
INTRAVENOUS | Status: DC | PRN
  Administered 2011-01-17: 35 mg via INTRAVENOUS

## 2011-01-17 MED ORDER — AMOXICILLIN 500 MG PO CAPS: 500.0000 mg | ORAL_CAPSULE | Freq: Three times a day (TID) | ORAL | Status: AC

## 2011-01-17 MED ORDER — LACTATED RINGERS IV SOLN
INTRAVENOUS | Status: DC | PRN
  Administered 2011-01-17: 07:00:00 via INTRAVENOUS

## 2011-01-17 MED ORDER — MIDAZOLAM HCL 5 MG/5ML IJ SOLN
INTRAMUSCULAR | Status: DC | PRN
  Administered 2011-01-17: 1 mg via INTRAVENOUS

## 2011-01-17 SURGICAL SUPPLY — 31 items
BUR CROSS CUT FISSURE 1.6 (BURR) ×1 IMPLANT
BUR EGG ELITE 4.0 (BURR) IMPLANT
CANISTER SUCTION 2500CC (MISCELLANEOUS) ×2 IMPLANT
CLOTH BEACON ORANGE TIMEOUT ST (SAFETY) ×2 IMPLANT
COVER SURGICAL LIGHT HANDLE (MISCELLANEOUS) ×2 IMPLANT
DECANTER SPIKE VIAL GLASS SM (MISCELLANEOUS) ×2 IMPLANT
GAUZE PACKING FOLDED 2  STR (GAUZE/BANDAGES/DRESSINGS) ×1
GAUZE PACKING FOLDED 2 STR (GAUZE/BANDAGES/DRESSINGS) ×1 IMPLANT
GLOVE BIO SURGEON STRL SZ 6.5 (GLOVE) ×3 IMPLANT
GLOVE BIO SURGEON STRL SZ7 (GLOVE) ×2 IMPLANT
GLOVE BIO SURGEON STRL SZ7.5 (GLOVE) ×2 IMPLANT
GLOVE BIOGEL PI IND STRL 6.5 (GLOVE) IMPLANT
GLOVE BIOGEL PI IND STRL 7.0 (GLOVE) ×1 IMPLANT
GLOVE BIOGEL PI INDICATOR 6.5 (GLOVE) ×1
GLOVE BIOGEL PI INDICATOR 7.0 (GLOVE)
GOWN STRL NON-REIN LRG LVL3 (GOWN DISPOSABLE) ×4 IMPLANT
GOWN STRL REIN XL XLG (GOWN DISPOSABLE) ×2 IMPLANT
KIT BASIN OR (CUSTOM PROCEDURE TRAY) ×2 IMPLANT
KIT ROOM TURNOVER OR (KITS) ×2 IMPLANT
NEEDLE 22X1 1/2 (OR ONLY) (NEEDLE) ×2 IMPLANT
NS IRRIG 1000ML POUR BTL (IV SOLUTION) ×2 IMPLANT
PAD ARMBOARD 7.5X6 YLW CONV (MISCELLANEOUS) ×4 IMPLANT
SUT CHROMIC 3 0 PS 2 (SUTURE) ×2 IMPLANT
SYR 50ML SLIP (SYRINGE) IMPLANT
SYRINGE 10CC LL (SYRINGE) IMPLANT
TOWEL OR 17X24 6PK STRL BLUE (TOWEL DISPOSABLE) ×2 IMPLANT
TOWEL OR 17X26 10 PK STRL BLUE (TOWEL DISPOSABLE) ×2 IMPLANT
TRAY ENT MC OR (CUSTOM PROCEDURE TRAY) ×2 IMPLANT
TUBING IRRIGATION (MISCELLANEOUS) IMPLANT
WATER STERILE IRR 1000ML POUR (IV SOLUTION) IMPLANT
YANKAUER SUCT BULB TIP NO VENT (SUCTIONS) ×2 IMPLANT

## 2011-01-17 NOTE — Progress Notes (Signed)
D/c instructions and prescriptions given to [facility] (group home caregiver) by Kristen Cardinal RN. All questions answered.

## 2011-01-17 NOTE — Anesthesia Preprocedure Evaluation (Addendum)
Anesthesia Evaluation  Patient identified by MRN, date of birth, ID band Patient awake    Reviewed: Allergy & Precautions, H&P , NPO status , Patient's Chart, lab work & pertinent test results  History of Anesthesia Complications Negative for: history of anesthetic complications  Airway Mallampati: II TM Distance: >3 FB   Mouth opening: Limited Mouth Opening  Dental  (+) Dental Advisory Given and Teeth Intact   Pulmonary neg pulmonary ROS,    Pulmonary exam normal       Cardiovascular hypertension, Pt. on home beta blockers Regular + Carotid Bruit    Neuro/Psych PSYCHIATRIC DISORDERS Anxiety Depression Schizophrenia    GI/Hepatic negative GI ROS, Neg liver ROS,   Endo/Other  Diabetes mellitus-, Insulin DependentHyperthyroidism   Renal/GU ESRF and DialysisRenal disease     Musculoskeletal   Abdominal   Peds  Hematology   Anesthesia Other Findings   Reproductive/Obstetrics                        Anesthesia Physical Anesthesia Plan  ASA: III  Anesthesia Plan: General   Post-op Pain Management:    Induction: Intravenous  Airway Management Planned: Oral ETT  Additional Equipment:   Intra-op Plan:   Post-operative Plan: Extubation in OR  Informed Consent:   Dental advisory given  Plan Discussed with: CRNA, Anesthesiologist and Surgeon  Anesthesia Plan Comments:         Anesthesia Quick Evaluation

## 2011-01-17 NOTE — Op Note (Signed)
[name], [name] NO.:  [id number]  MEDICAL RECORD NO.:  [id number]  LOCATION:  [facility]                         FACILITY:  [facility]  PHYSICIAN:  Gae Bon, M.D.  DATE OF BIRTH:  September 13, 1970  DATE OF PROCEDURE:  01/17/2011 DATE OF DISCHARGE:                              OPERATIVE REPORT   PREOPERATIVE DIAGNOSIS:  Nonrestorable teeth numbers 1, 16, 17, 32.  POSTOPERATIVE DIAGNOSIS:  Nonrestorable teeth numbers 1, 16, 17, 32.  PROCEDURE:  Extraction of teeth numbers 1, 16, 17, 32.  SURGEON:  Gae Bon, MD  ANESTHESIA:  General oral, Dr. Al Corpus, attending.  ASSISTANTS:  Cimler and Samson.  INDICATIONS FOR PROCEDURE:  [name] is a 40 year old black male, who is referred to me from his general dentist for removal of nonrestorable wisdom teeth.  PAST MEDICAL HISTORY:  Significant for chronic quadrant paresis, insulin- dependent diabetes, end-stage renal disease, legally blind, schizophrenia, secondary hyperparathyroidism, pre-hypertension.  Because of the significant medical histories and uncooperativeness, it was recommended that the patient have surgery under general anesthesia.  PROCEDURE:  The patient was taken to the operating room, placed on the table in supine position.  General anesthesia was administered intravenously and an oral endotracheal tube was placed and marked.  The patient was then draped for the procedure.  The posterior pharynx was suctioned.  A throat pack was placed.  2% lidocaine, 1:100,000 epinephrine was infiltrated and an inferior alveolar block on the right and left side and buccal and palatal infiltration in the maxilla around the upper third molars, total of 10 mL of solution was utilized.  Then, a bite block was placed on the right side of the mouth and a sweetheart retractor was placed to retract the tongue.  Then, a 15 blade was used to make an incision around teeth numbers 1, 16, 17.  The periosteum was reflected from  around these teeth.  Then, the Sumex drill with fissure bur was used to remove circumferential bone around the teeth.  The teeth were then elevated with a 301 elevator and removed from the mouth with the universal upper and lower forceps.  Sockets were then curetted, irrigated, and closed with 3-0 chromic.  The bite block and sweetheart retractor were then repositioned to the other side of the mouth as was the endotracheal tube, taking care to avoid dislodging the tube.  Then, the 15 blade was used to make an incision around teeth numbers 1 and 32. The periosteum was reflected with a periosteal elevator.  Bone was removed with the drill, with fissure bur and then the teeth were elevated with a 301 elevator.  Tooth number 1 was removed with a rongeur and tooth number 32 was removed with a number 151 forceps.  The sockets were then curetted, irrigated, and closed with 3-0 chromic.  The oral cavity was then irrigated, suctioned, inspected, and throat pack was removed.  The patient was awakened and taken to the recovery room, breathing spontaneously in good condition.  EBL:  Minimum.  COMPLICATIONS:  None.  SPECIMENS:  None.     Gae Bon, M.D.     SMJ/MEDQ  D:  01/17/2011  T:  01/17/2011  Job:  [id number]

## 2011-01-17 NOTE — Anesthesia Procedure Notes (Signed)
Procedures

## 2011-01-17 NOTE — Anesthesia Postprocedure Evaluation (Signed)
  Anesthesia Post-op Note  Patient: [name]  Procedure(s) Performed:  MULTIPLE EXTRACION WITH ALVEOLOPLASTY - Extraction of number one, sixteen, 69, and thirty two.   Patient Location: PACU  Anesthesia Type: General  Level of Consciousness: awake  Airway and Oxygen Therapy: Patient Spontanous Breathing  Post-op Pain: mild  Post-op Assessment: Post-op Vital signs reviewed, Patient's Cardiovascular Status Stable, Respiratory Function Stable, Patent Airway, No signs of Nausea or vomiting and Pain level controlled  Post-op Vital Signs: Reviewed and stable  Complications: No apparent anesthesia complications

## 2011-01-17 NOTE — Transfer of Care (Signed)
Immediate Anesthesia Transfer of Care Note  Patient: [name]  Procedure(s) Performed:  MULTIPLE EXTRACION WITH ALVEOLOPLASTY - Extraction of number one, sixteen, 81, and thirty two.   Patient Location: PACU  Anesthesia Type: General  Level of Consciousness: sedated  Airway & Oxygen Therapy: Patient Spontanous Breathing and Patient connected to face mask oxygen  Post-op Assessment: Report given to PACU RN  Post vital signs: Reviewed and stable  Complications: No apparent anesthesia complications

## 2011-01-17 NOTE — Preoperative (Signed)
Beta Blockers   Reason not to administer Beta Blockers:Not Applicable 

## 2011-01-17 NOTE — Op Note (Signed)
01/17/2011  8:17 AM  PATIENT:  [name]  40 y.o. male  PRE-OPERATIVE DIAGNOSIS:  Nonrestoreable teeth #'s 1, 16, 17, 32  POST-OPERATIVE DIAGNOSIS:  Same  PROCEDURE:  Procedure(s): Extraction Nonrestoreable teeth #'s 1, 16, 17, 32   SURGEON:  Surgeon(s): Gae Bon  ANESTHESIA:   local and general  EBL:  minimal  DRAINS: none   LOCAL MEDICATIONS USED:  LIDOCAINE 10 CC  SPECIMEN:  No Specimen  COUNTS:  YES  PLAN OF CARE: Discharge to home after PACU  PATIENT DISPOSITION:  PACU - hemodynamically stable.   PROCEDURE DETAILS: Dictation # [id number]  Gae Bon, DMD 01/17/2011 8:17 AM

## 2011-01-17 NOTE — Progress Notes (Signed)
H&P documentation  -History and Physical Reviewed  -Patient has been re-examined  -No change in the plan of care  Francile Woolford M  

## 2011-01-18 LAB — POCT I-STAT 4, (NA,K, GLUC, HGB,HCT)
Glucose, Bld: 131 mg/dL — ABNORMAL HIGH (ref 70–99)
HCT: 32 % — ABNORMAL LOW (ref 39.0–52.0)
Hemoglobin: 10.9 g/dL — ABNORMAL LOW (ref 13.0–17.0)
Potassium: 3.5 mEq/L (ref 3.5–5.1)
Sodium: 136 mEq/L (ref 135–145)

## 2011-01-19 ENCOUNTER — Encounter (HOSPITAL_COMMUNITY): Payer: Self-pay | Admitting: Oral Surgery

## 2011-03-14 IMAGING — CR DG CHEST 1V PORT
1 series · 1 of 1 positions shown · non-contrast
Comparison: 05/23/2009

CLINICAL DATA: Sepsis, dialysis catheter placement.

PORTABLE CHEST - 1 VIEW

[view not recorded]
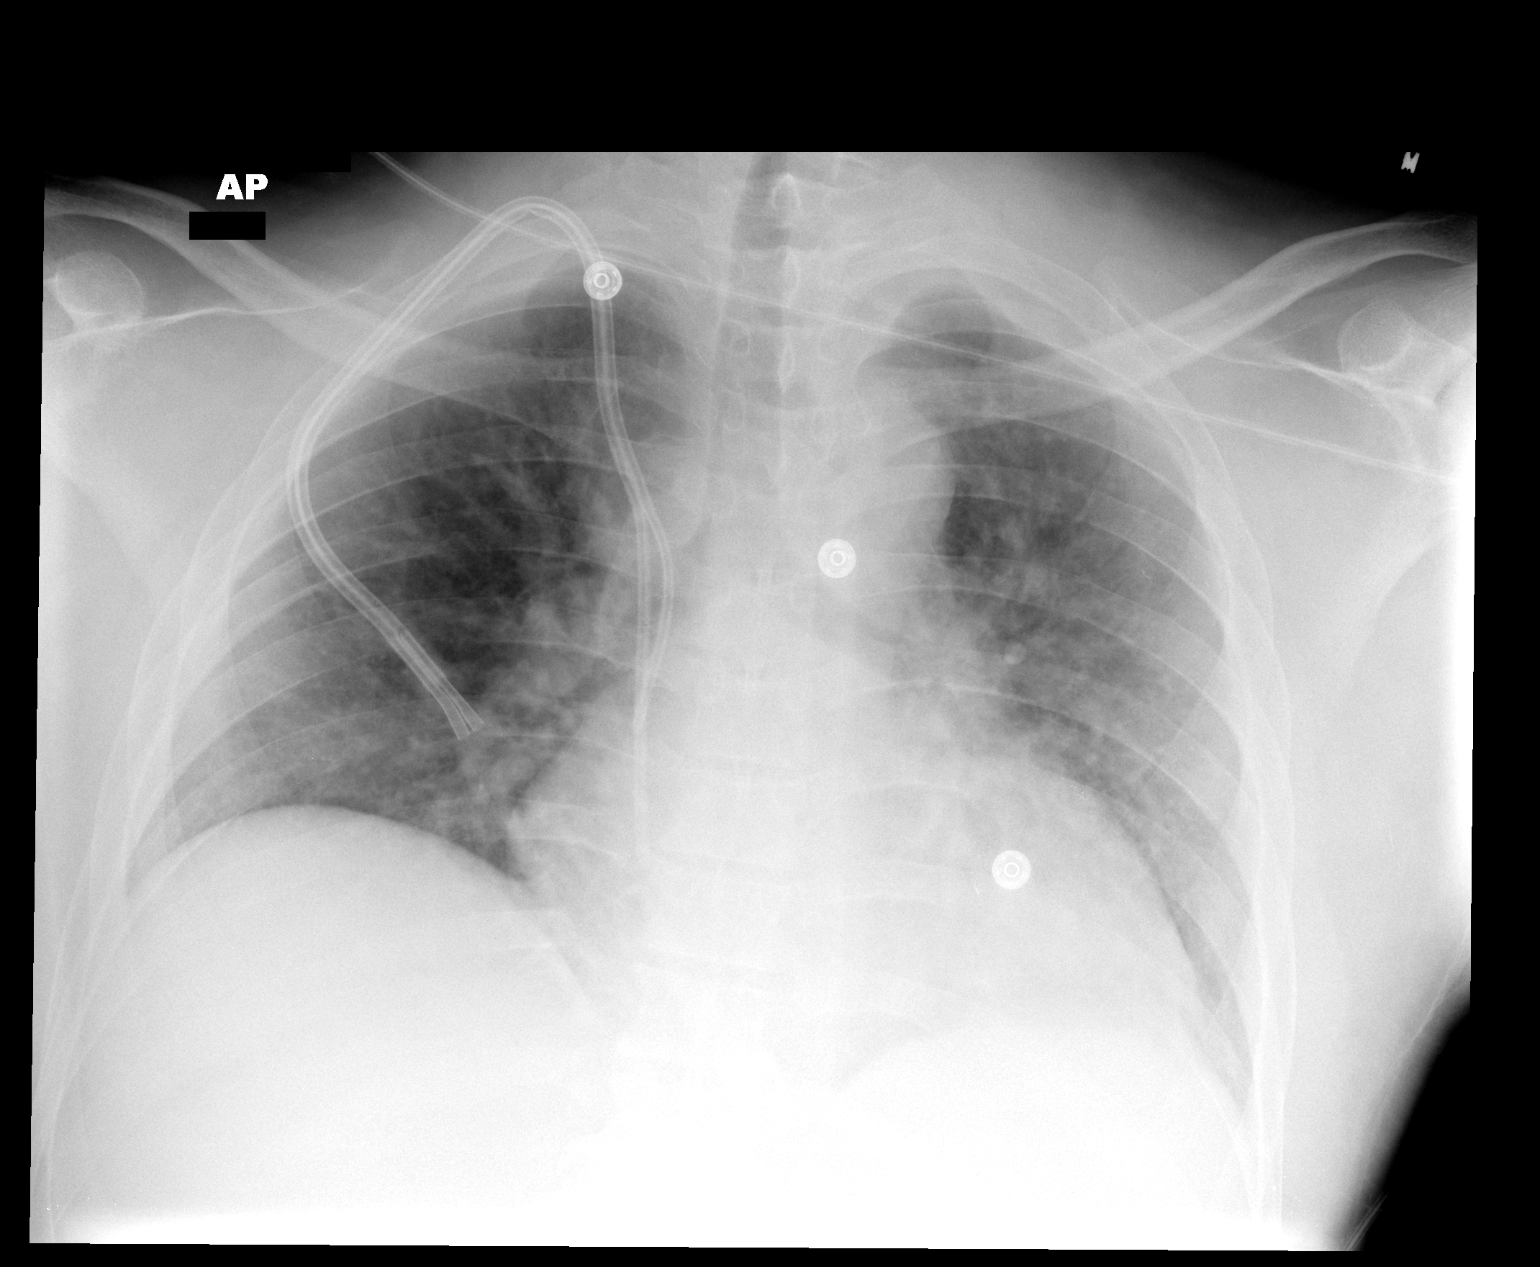

[1 of 1 positions shown; findings below may reference images not displayed]

FINDINGS: Interval removal of left dialysis catheter and placement
of right catheter.  The distal tip is in the mid to upper right
atrium.  No pneumothorax.

There is cardiomegaly with vascular congestion.  No confluent
opacities or effusions.  No overt edema.
IMPRESSION: Right dialysis catheter tip in the mid to upper right atrium.  No
pneumothorax.

Cardiomegaly, vascular congestion.

## 2011-06-29 ENCOUNTER — Encounter (HOSPITAL_BASED_OUTPATIENT_CLINIC_OR_DEPARTMENT_OTHER): Payer: Medicaid Other | Attending: Internal Medicine

## 2011-06-29 ENCOUNTER — Other Ambulatory Visit (HOSPITAL_BASED_OUTPATIENT_CLINIC_OR_DEPARTMENT_OTHER): Payer: Self-pay | Admitting: Internal Medicine

## 2011-06-29 ENCOUNTER — Ambulatory Visit (HOSPITAL_COMMUNITY)
Admission: RE | Admit: 2011-06-29 | Discharge: 2011-06-29 | Disposition: A | Discharge status: Home / Self Care | Payer: Medicaid Other | Source: Ambulatory Visit | Attending: Internal Medicine | Admitting: Internal Medicine

## 2011-06-29 DIAGNOSIS — I120 Hypertensive chronic kidney disease with stage 5 chronic kidney disease or end stage renal disease: Secondary | ICD-10-CM | POA: Insufficient documentation

## 2011-06-29 DIAGNOSIS — N2581 Secondary hyperparathyroidism of renal origin: Secondary | ICD-10-CM | POA: Insufficient documentation

## 2011-06-29 DIAGNOSIS — M79609 Pain in unspecified limb: Secondary | ICD-10-CM | POA: Insufficient documentation

## 2011-06-29 DIAGNOSIS — X58XXXA Exposure to other specified factors, initial encounter: Secondary | ICD-10-CM | POA: Insufficient documentation

## 2011-06-29 DIAGNOSIS — S91309A Unspecified open wound, unspecified foot, initial encounter: Secondary | ICD-10-CM | POA: Insufficient documentation

## 2011-06-29 DIAGNOSIS — Z794 Long term (current) use of insulin: Secondary | ICD-10-CM | POA: Insufficient documentation

## 2011-06-29 DIAGNOSIS — M79673 Pain in unspecified foot: Secondary | ICD-10-CM

## 2011-06-29 DIAGNOSIS — N186 End stage renal disease: Secondary | ICD-10-CM | POA: Insufficient documentation

## 2011-06-29 DIAGNOSIS — E785 Hyperlipidemia, unspecified: Secondary | ICD-10-CM | POA: Insufficient documentation

## 2011-06-29 NOTE — Progress Notes (Signed)
[facility]  NAME:  [name], RENBERG NO.:  [id number]  MEDICAL RECORD NO.:  [id number]      DATE OF BIRTH:  [birth year]  PHYSICIAN:  Ricard Dillon, M.D.      VISIT DATE:                                  OFFICE VISIT   Mr. Standing comes to see [place] in the [place] today along with his care attendant from [facility] in [place].  He has had a wound on the anterior aspect of his left foot for about 10 days now. The pathogenesis of this is not really certain.  There is some suggestion that this might have been some form of trauma; however, the history is really lacking here.  Apparently, there was a fair amount of swelling in the left foot, although this has also come down.  The patient does not complain of pain, although he may be insensate.  PAST MEDICAL HISTORY:  Fairly extensive and includes: 1. End-stage renal disease, on dialysis. 2. Type 1 diabetes. 3. Secondary hyperparathyroidism. 4. Hyperlipidemia. 5. Hypertension. 6. 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  MEDICATION LIST:  Reviewed.  He is on Lantus insulin as well as NovoLog as far as his diabetes is concerned.  His other medications are reviewed.  Do not have any particular relevance to wound healing.  FAMILY HISTORY:  Nothing really of relevance.  No positive history of diabetes.  SOCIAL HISTORY:  He is single, apparent nonsmoker.  He is very disabled in the facility, spends most of his time in a wheelchair.  GENERAL EXAMINATION:  VITAL SIGNS:  Temperature is 97.6, pulse 85, respirations 18, blood pressure 146/90. CARDIAC:  Heart sounds are normal.  There are no murmurs. ABDOMEN:  Soft.  There is no liver, no spleen. EXTREMITIES:  Both of his feet are extra rotated.  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  There is  tremendous muscle wasting in both his legs, which almost looks of a lower motor neuron pattern.  He is able to lift both his arms.  His fingers are flexed; however, he is able to open them.  Peripheral pulses are palpable at the dorsalis pedis.  There is some edema here, but not particularly ominous and probably just dependent edema.  His ABI was calculated in the left leg at 0.82 in our clinic.  The area of concern that measures 5 x 5.6 x 0.1 is on the dorsal foot encompassing the base of his first, second, and third toes.  There is no overt infection here.  No purulence.  No drainage.  I did not do any cultures.  It appears that there has been some drainage and there is some maceration involving his toes.  IMPRESSION:  Traumatic wound to the left foot.  Given the appearance of this, I almost wondered about having his foot drag along under the surface of the wheelchair, for example.  For sake of completeness, I have ordered an x-ray of the foot.  As mentioned, he is probably reasonably insensate, although I had trouble with the accuracy of my exam.  In terms of the wound, we applied silver alginate to this, gauze, and placed this in Kerlix and net.  This can be left alone for a week. We did a left anterior foot x-ray as noted.  Although this does not look overtly like a serious underlying trauma, sometimes these things can be deceiving.          ______________________________ Ricard Dillon, M.D.     MGR/MEDQ  D:  06/29/2011  T:  06/29/2011  Job:  [id number]

## 2011-07-13 ENCOUNTER — Encounter (HOSPITAL_BASED_OUTPATIENT_CLINIC_OR_DEPARTMENT_OTHER): Payer: Medicaid Other | Attending: Internal Medicine

## 2011-07-13 DIAGNOSIS — Z79899 Other long term (current) drug therapy: Secondary | ICD-10-CM | POA: Insufficient documentation

## 2011-07-13 DIAGNOSIS — N186 End stage renal disease: Secondary | ICD-10-CM | POA: Insufficient documentation

## 2011-07-13 DIAGNOSIS — I120 Hypertensive chronic kidney disease with stage 5 chronic kidney disease or end stage renal disease: Secondary | ICD-10-CM | POA: Insufficient documentation

## 2011-07-13 DIAGNOSIS — E1169 Type 2 diabetes mellitus with other specified complication: Secondary | ICD-10-CM | POA: Insufficient documentation

## 2011-07-13 DIAGNOSIS — L97509 Non-pressure chronic ulcer of other part of unspecified foot with unspecified severity: Secondary | ICD-10-CM | POA: Insufficient documentation

## 2011-07-13 DIAGNOSIS — Z794 Long term (current) use of insulin: Secondary | ICD-10-CM | POA: Insufficient documentation

## 2011-07-13 DIAGNOSIS — G8250 Quadriplegia, unspecified: Secondary | ICD-10-CM | POA: Insufficient documentation

## 2011-08-03 ENCOUNTER — Encounter (HOSPITAL_BASED_OUTPATIENT_CLINIC_OR_DEPARTMENT_OTHER): Payer: Medicaid Other

## 2013-02-19 ENCOUNTER — Other Ambulatory Visit: Payer: Self-pay | Admitting: Family Medicine

## 2013-02-19 ENCOUNTER — Ambulatory Visit
Admission: RE | Admit: 2013-02-19 | Discharge: 2013-02-19 | Disposition: A | Discharge status: Home / Self Care | Payer: Medicaid Other | Source: Ambulatory Visit | Attending: Family Medicine | Admitting: Family Medicine

## 2013-02-19 DIAGNOSIS — M7989 Other specified soft tissue disorders: Secondary | ICD-10-CM

## 2013-06-26 ENCOUNTER — Encounter: Payer: Self-pay | Admitting: Internal Medicine

## 2013-06-26 ENCOUNTER — Ambulatory Visit (INDEPENDENT_AMBULATORY_CARE_PROVIDER_SITE_OTHER): Payer: Medicaid Other | Admitting: Internal Medicine

## 2013-06-26 DIAGNOSIS — M8660 Other chronic osteomyelitis, unspecified site: Secondary | ICD-10-CM

## 2013-06-26 DIAGNOSIS — M9080 Osteopathy in diseases classified elsewhere, unspecified site: Secondary | ICD-10-CM

## 2013-06-26 DIAGNOSIS — E1169 Type 2 diabetes mellitus with other specified complication: Secondary | ICD-10-CM

## 2013-06-26 DIAGNOSIS — M869 Osteomyelitis, unspecified: Secondary | ICD-10-CM

## 2013-06-26 LAB — BASIC METABOLIC PANEL WITH GFR
BUN: 30 mg/dL — ABNORMAL HIGH (ref 6–23)
CO2: 27 mEq/L (ref 19–32)
Calcium: 8.8 mg/dL (ref 8.4–10.5)
Chloride: 99 mEq/L (ref 96–112)
Creat: 5.04 mg/dL — ABNORMAL HIGH (ref 0.50–1.35)
GFR, Est African American: 15 mL/min — ABNORMAL LOW
GFR, Est Non African American: 13 mL/min — ABNORMAL LOW
Glucose, Bld: 185 mg/dL — ABNORMAL HIGH (ref 70–99)
Potassium: 4.1 mEq/L (ref 3.5–5.3)
Sodium: 138 mEq/L (ref 135–145)

## 2013-06-26 LAB — CBC WITH DIFFERENTIAL/PLATELET
Basophils Absolute: 0 10*3/uL (ref 0.0–0.1)
Basophils Relative: 1 % (ref 0–1)
Eosinophils Absolute: 0.1 10*3/uL (ref 0.0–0.7)
Eosinophils Relative: 3 % (ref 0–5)
HCT: 35.5 % — ABNORMAL LOW (ref 39.0–52.0)
Hemoglobin: 11.4 g/dL — ABNORMAL LOW (ref 13.0–17.0)
Lymphocytes Relative: 19 % (ref 12–46)
Lymphs Abs: 0.7 10*3/uL (ref 0.7–4.0)
MCH: 30.1 pg (ref 26.0–34.0)
MCHC: 32.1 g/dL (ref 30.0–36.0)
MCV: 93.7 fL (ref 78.0–100.0)
Monocytes Absolute: 0.4 10*3/uL (ref 0.1–1.0)
Monocytes Relative: 11 % (ref 3–12)
Neutro Abs: 2.4 10*3/uL (ref 1.7–7.7)
Neutrophils Relative %: 66 % (ref 43–77)
Platelets: 144 10*3/uL — ABNORMAL LOW (ref 150–400)
RBC: 3.79 MIL/uL — ABNORMAL LOW (ref 4.22–5.81)
RDW: 15.5 % (ref 11.5–15.5)
WBC: 3.6 10*3/uL — ABNORMAL LOW (ref 4.0–10.5)

## 2013-06-26 LAB — C-REACTIVE PROTEIN: CRP: <0.5 mg/dL (ref <0.60)

## 2013-06-26 LAB — SEDIMENTATION RATE: Sed Rate: 6 mm/hr (ref 0–16)

## 2013-06-26 MED ORDER — SULFAMETHOXAZOLE-TMP DS 800-160 MG PO TABS: 1.0000 | ORAL_TABLET | Freq: Every day | ORAL | Status: DC

## 2013-06-26 NOTE — Progress Notes (Signed)
Subjective:    Patient ID: [name], male    DOB: 03-18-1970, 43 y.o.   MRN: [id number]  HPI 43 yo M with poorly controlled DM c/b ESRD on HD, diabetic coma, wheelchair bound  X 5 yrs who sustained swelling, tenderness to his left distal index finger in January 2015. Initially, it was swollen 3-4 x normal size. He denies any injury prior to the swelling to finger or nail bed. Over the past few months, the swelling has decreased, he denies tenderness to it. It does however have some contracture which previously existed prior to January. In early of April, he was referred to orthopedics for evaluation. Xray at that time showed signs of destruction at the level of the middle phalanx with osteopenia at the distal and proximal phalynx. Surgical intervention (amputation) was offered but the patient/caregivers were not interested in pursuing this at this time. Since the patient has not had a trial of antibiotics for possible treatment of chronic osteo, he was referred here for furhter management.  The patient denies fever, chills, nightsweats. Good appetite. Doing well with HD, but may have upcoming fistula gram. His index finger appears swollen, firm, non tender not causing him a problem per patient and care attendant  Current Outpatient Prescriptions on File Prior to Visit  Medication Sig Dispense Refill  . amLODipine (NORVASC) 10 MG tablet Take 10 mg by mouth daily.        . cinacalcet (SENSIPAR) 30 MG tablet Take 30 mg by mouth daily.        . cloNIDine (CATAPRES) 0.2 MG tablet Take 0.2 mg by mouth 2 (two) times daily.        . hydrALAZINE (APRESOLINE) 100 MG tablet Take 100 mg by mouth 3 (three) times daily.        . hydroxypropyl methylcellulose (ISOPTO TEARS) 2.5 % ophthalmic solution Place 1 drop into both eyes 2 (two) times daily.        . insulin glargine (LANTUS) 100 UNIT/ML injection Inject 20 Units into the skin 2 (two) times daily.        . insulin lispro (HUMALOG) 100 UNIT/ML  injection Inject 5 Units into the skin 3 (three) times daily before meals.        Marland Kitchen labetalol (NORMODYNE) 200 MG tablet Take 200 mg by mouth 3 (three) times daily.        Marland Kitchen LORazepam (ATIVAN) 1 MG tablet Take 1 mg by mouth every 8 (eight) hours as needed. For anxiety       . losartan (COZAAR) 100 MG tablet Take 100 mg by mouth daily.        . multivitamin (RENA-VIT) TABS tablet Take 1 tablet by mouth daily.        . nitroGLYCERIN (NITROSTAT) 0.4 MG SL tablet Place 0.4 mg under the tongue every 5 (five) minutes as needed. For chest pain       . pantoprazole (PROTONIX) 40 MG tablet Take 40 mg by mouth daily.        . risperiDONE (RISPERDAL) 2 MG tablet Take 2 mg by mouth 2 (two) times daily.        . sevelamer (RENVELA) 800 MG tablet Take 800 mg by mouth 3 (three) times daily.        . simvastatin (ZOCOR) 20 MG tablet Take 20 mg by mouth at bedtime.        . temazepam (RESTORIL) 15 MG capsule Take 15 mg by mouth at bedtime.        Marland Kitchen  Wheat Dextrin (BENEFIBER DRINK MIX) PACK Take 1 packet by mouth 2 (two) times daily.         No current facility-administered medications on file prior to visit.   Active Ambulatory Problems    Diagnosis Date Noted  . No Active Ambulatory Problems   Resolved Ambulatory Problems    Diagnosis Date Noted  . No Resolved Ambulatory Problems   Past Medical History  Diagnosis Date  . Diabetes mellitus   . Hyperthyroidism   . Quadriparesis (muscle weakness)   . Constipation   . Chronic kidney disease esrd  . Schizo-affective schizophrenia   . Hypertension   . Depression    History  Substance Use Topics  . Smoking status: Never Smoker   . Smokeless tobacco: Not on file  . Alcohol Use: No  family history is not on file.   Review of Systems Unable to completely review since patient responds only to certain questions     Objective:   Physical Exam There were no vitals taken for this visit. Physical Exam  Constitutional: He is oriented to person. He  appears thin, chronically ill. No distress. Blind. Wheelchair bound HENT:  Mouth/Throat: Oropharynx is clear and moist. No oropharyngeal exudate. Cardiovascular: Normal rate, regular rhythm and normal heart sounds. Exam reveals no gallop and no friction rub.  No murmur heard.  Pulmonary/Chest: Effort normal and breath sounds normal. No respiratory distress. He has no wheezes.  Abdominal: Soft. Bowel sounds are normal. He exhibits no distension. There is no tenderness.  Lymphadenopathy:  He has no cervical adenopathy. No axillary LAD Ext: right arm thrill from AVF in upper arm. Left index finger has distal joint swelling no fluctuance, no redness no warmth. Decreased ROm from prior contracture to hands and digits bilaterally Skin: Skin is warm and dry. No rash noted. No erythema.  Psychiatric: at baseline per care attendant. answers         Assessment & Plan:  Chronic osteomyelitis of left index finger = will check cbc, bmp, sed rate, crp, and do empiric trial of bactrim ds daily. He is to see orthopedics June 8th. Not interested in surgery as of yet.  rtc in 4-6 wk

## 2013-08-07 ENCOUNTER — Encounter: Payer: Self-pay | Admitting: Internal Medicine

## 2013-08-07 ENCOUNTER — Ambulatory Visit (INDEPENDENT_AMBULATORY_CARE_PROVIDER_SITE_OTHER): Payer: Medicaid Other | Admitting: Internal Medicine

## 2013-08-07 VITALS — BP 163/93 | HR 84 | Temp 97.4°F

## 2013-08-07 DIAGNOSIS — M869 Osteomyelitis, unspecified: Secondary | ICD-10-CM

## 2013-08-07 DIAGNOSIS — E1169 Type 2 diabetes mellitus with other specified complication: Secondary | ICD-10-CM

## 2013-08-07 DIAGNOSIS — M9080 Osteopathy in diseases classified elsewhere, unspecified site: Secondary | ICD-10-CM

## 2013-08-07 NOTE — Progress Notes (Signed)
   Subjective:    Patient ID: [name], male    DOB: [birth year], 43 y.o.   MRN: [id number]  HPI 43 yo M with poorly controlled DM c/b ESRD on HD, diabetic coma with neurologic insult, wheelchair bound X 5 yrs who sustained swelling, tenderness to his left distal index finger in January 2015. Initially, it was swollen 3-4 x normal size. He denies any injury prior to the swelling to finger or nail bed.In early of April, he was referred to orthopedics for evaluation. Xray at that time showed signs of destruction at the level of the middle phalanx with osteopenia at the distal and proximal phalynx. Surgical intervention (amputation) was offered but the patient/caregivers were not interested in pursuing this at this time. He was seen in late May started on 6 wk course of bactrim DS daily to see if it would help with treating osteomyelitis of left index finger. Saw ortho 2 wk thought it was improved, released from care. He states that he does not have anymore tenderness to his finger. It remains somewhat swollen but no warmth or erythema from the DIP.  Lab Results  Component Value Date   ESRSEDRATE 6 06/26/2013   Lab Results  Component Value Date   CRP <0.5 06/26/2013   CBC    Component Value Date/Time   WBC 3.6* 06/26/2013 0949   RBC 3.79* 06/26/2013 0949   RBC 2.82* 12/20/2007 0055   HGB 11.4* 06/26/2013 0949   HCT 35.5* 06/26/2013 0949   PLT 144* 06/26/2013 0949   MCV 93.7 06/26/2013 0949   MCH 30.1 06/26/2013 0949   MCHC 32.1 06/26/2013 0949   RDW 15.5 06/26/2013 0949   LYMPHSABS 0.7 06/26/2013 0949   MONOABS 0.4 06/26/2013 0949   EOSABS 0.1 06/26/2013 0949   BASOSABS 0.0 06/26/2013 0949        Review of Systems 10 point ros is negative    Objective:   Physical Exam BP 163/93  Pulse 84  Temp(Src) 97.4 F (36.3 C) (Oral) gen = in wheelchair, quiet, sleepy this morning with his care attendant pulm = ctab Cors= nl s1,s2, no g/m/r Skin = still has distal swelling to dip of index  finger of left hand, no fluctuance no erythema, non tender, measures 7cm around       Assessment & Plan:  Chronic osteo of finger = will continue bactrim ds 1 tab daily for addn 8 wk. We will see him back in 6 wk and reassess at that time.

## 2013-09-18 ENCOUNTER — Ambulatory Visit: Payer: Medicaid Other | Admitting: Internal Medicine

## 2013-10-07 ENCOUNTER — Ambulatory Visit (INDEPENDENT_AMBULATORY_CARE_PROVIDER_SITE_OTHER): Payer: Medicaid Other | Admitting: Internal Medicine

## 2013-10-07 ENCOUNTER — Encounter: Payer: Self-pay | Admitting: Internal Medicine

## 2013-10-07 VITALS — BP 110/69 | HR 77 | Temp 97.4°F

## 2013-10-07 DIAGNOSIS — M8660 Other chronic osteomyelitis, unspecified site: Secondary | ICD-10-CM

## 2013-10-07 NOTE — Progress Notes (Signed)
Subjective:    Patient ID: [name], male    DOB: May 02, 1970, 43 y.o.   MRN: [id number]  HPI  43 yo M with poorly controlled DM c/b ESRD on HD, diabetic coma with neurologic insult, wheelchair bound X 5 yrs who sustained swelling, tenderness to his left distal index finger in January 2015. Initially, it was swollen 3-4 x normal size. He denies any injury prior to the swelling to finger or nail bed.In early of April, he was referred to orthopedics for evaluation. Xray at that time showed signs of destruction at the level of the middle phalanx with osteopenia at the distal and proximal phalynx. Surgical intervention (amputation) was offered but the patient/caregivers were not interested in pursuing this at this time. He was seen in late May started onbactrim DS daily to see if it would help with treating osteomyelitis of left index finger. Saw ortho in mid June,  thought it was improved, released from care. We saw him last in early July, where his finger remained somewhat swollen but no warmth or erythema from the DIP. At that time we planned to place him on an additional 8 wk of  Bactrim. He is here for follow up. Finger swelling has decreased since we last saw him but not back to previous size, no tenderness has good range of motion in comparison to contraction with other fingers of left hand  Current Outpatient Prescriptions on File Prior to Visit  Medication Sig Dispense Refill  . amLODipine (NORVASC) 10 MG tablet Take 10 mg by mouth daily.        . cinacalcet (SENSIPAR) 30 MG tablet Take 30 mg by mouth daily.        . cloNIDine (CATAPRES) 0.2 MG tablet Take 0.2 mg by mouth 2 (two) times daily.        . hydrALAZINE (APRESOLINE) 100 MG tablet Take 100 mg by mouth 3 (three) times daily.        . hydroxypropyl methylcellulose (ISOPTO TEARS) 2.5 % ophthalmic solution Place 1 drop into both eyes 2 (two) times daily.        . insulin glargine (LANTUS) 100 UNIT/ML injection Inject 20 Units into  the skin 2 (two) times daily.        . insulin lispro (HUMALOG) 100 UNIT/ML injection Inject 5 Units into the skin 3 (three) times daily before meals.        Marland Kitchen labetalol (NORMODYNE) 200 MG tablet Take 200 mg by mouth 3 (three) times daily.        Marland Kitchen LORazepam (ATIVAN) 1 MG tablet Take 1 mg by mouth every 8 (eight) hours as needed. For anxiety       . losartan (COZAAR) 100 MG tablet Take 100 mg by mouth daily.        . multivitamin (RENA-VIT) TABS tablet Take 1 tablet by mouth daily.        . nitroGLYCERIN (NITROSTAT) 0.4 MG SL tablet Place 0.4 mg under the tongue every 5 (five) minutes as needed. For chest pain       . pantoprazole (PROTONIX) 40 MG tablet Take 40 mg by mouth daily.        . risperiDONE (RISPERDAL) 2 MG tablet Take 2 mg by mouth 2 (two) times daily.        . sevelamer (RENVELA) 800 MG tablet Take 800 mg by mouth 3 (three) times daily.        . simvastatin (ZOCOR) 20 MG tablet Take 20 mg by  mouth at bedtime.        . sulfamethoxazole-trimethoprim (BACTRIM DS) 800-160 MG per tablet Take 1 tablet by mouth daily.  30 tablet  11  . temazepam (RESTORIL) 15 MG capsule Take 15 mg by mouth at bedtime.        . Wheat Dextrin (BENEFIBER DRINK MIX) PACK Take 1 packet by mouth 2 (two) times daily.         No current facility-administered medications on file prior to visit.   Active Ambulatory Problems    Diagnosis Date Noted  . Chronic osteomyelitis 06/26/2013   Resolved Ambulatory Problems    Diagnosis Date Noted  . No Resolved Ambulatory Problems   Past Medical History  Diagnosis Date  . Diabetes mellitus   . Hyperthyroidism   . Quadriparesis (muscle weakness)   . Constipation   . Chronic kidney disease esrd  . Schizo-affective schizophrenia   . Hypertension   . Depression       Review of Systems 10 point ros is negative    Objective:   Physical Exam BP 110/69  Pulse 77  Temp(Src) 97.4 F (36.3 C) (Oral) Physical Exam  Constitutional: He is oriented to person.in  wheelchair. No distress.  Ext: left hand 2nd digit dip mildly swollen no fluctuance, no heat, no erythema, measures 6.5cm around.  Right arm AV fistula Skin: Skin is warm and dry. No erythem or rash. Psychiatric: flat affect, neurocognitive slowing    Labs:  Lab Results  Component Value Date   ESRSEDRATE 6 06/26/2013   Lab Results  Component Value Date   CRP <0.5 06/26/2013        Assessment & Plan:  Chronic osteo with finger = has been on therapy since mid May. No need for further treatment. Will discontinue bactrim  Contracture of hand = recommend doing hand exercises to minimize furhter loss of range of motion  Health maintenance = he has declined flu vaccine

## 2014-06-23 ENCOUNTER — Ambulatory Visit
Admission: RE | Admit: 2014-06-23 | Discharge: 2014-06-23 | Disposition: A | Discharge status: Home / Self Care | Payer: Medicaid Other | Source: Ambulatory Visit | Attending: Family Medicine | Admitting: Family Medicine

## 2014-06-23 ENCOUNTER — Other Ambulatory Visit: Payer: Self-pay | Admitting: Family Medicine

## 2014-06-23 DIAGNOSIS — R52 Pain, unspecified: Secondary | ICD-10-CM

## 2014-06-23 DIAGNOSIS — R609 Edema, unspecified: Secondary | ICD-10-CM

## 2014-09-25 ENCOUNTER — Other Ambulatory Visit: Payer: Self-pay | Admitting: Family Medicine

## 2014-09-25 ENCOUNTER — Ambulatory Visit
Admission: RE | Admit: 2014-09-25 | Discharge: 2014-09-25 | Disposition: A | Discharge status: Home / Self Care | Payer: Medicaid Other | Source: Ambulatory Visit | Attending: Family Medicine | Admitting: Family Medicine

## 2014-09-25 DIAGNOSIS — M25561 Pain in right knee: Secondary | ICD-10-CM

## 2015-02-24 ENCOUNTER — Encounter (HOSPITAL_COMMUNITY): Payer: Self-pay | Admitting: *Deleted

## 2015-02-24 ENCOUNTER — Inpatient Hospital Stay (HOSPITAL_COMMUNITY): Payer: Medicaid Other

## 2015-02-24 ENCOUNTER — Inpatient Hospital Stay (HOSPITAL_COMMUNITY)
Admission: EM | Admit: 2015-02-24 | Discharge: 2015-02-27 | DRG: 377 | Disposition: A | Discharge status: Home / Self Care | Payer: Medicaid Other | Attending: Internal Medicine | Admitting: Internal Medicine

## 2015-02-24 ENCOUNTER — Emergency Department (HOSPITAL_COMMUNITY): Payer: Medicaid Other

## 2015-02-24 DIAGNOSIS — M898X9 Other specified disorders of bone, unspecified site: Secondary | ICD-10-CM | POA: Diagnosis present

## 2015-02-24 DIAGNOSIS — J189 Pneumonia, unspecified organism: Secondary | ICD-10-CM | POA: Diagnosis present

## 2015-02-24 DIAGNOSIS — K921 Melena: Secondary | ICD-10-CM | POA: Diagnosis present

## 2015-02-24 DIAGNOSIS — Z794 Long term (current) use of insulin: Secondary | ICD-10-CM | POA: Diagnosis not present

## 2015-02-24 DIAGNOSIS — T68XXXA Hypothermia, initial encounter: Secondary | ICD-10-CM | POA: Diagnosis not present

## 2015-02-24 DIAGNOSIS — Z8673 Personal history of transient ischemic attack (TIA), and cerebral infarction without residual deficits: Secondary | ICD-10-CM | POA: Diagnosis not present

## 2015-02-24 DIAGNOSIS — E872 Acidosis: Secondary | ICD-10-CM | POA: Diagnosis present

## 2015-02-24 DIAGNOSIS — F209 Schizophrenia, unspecified: Secondary | ICD-10-CM | POA: Diagnosis present

## 2015-02-24 DIAGNOSIS — I120 Hypertensive chronic kidney disease with stage 5 chronic kidney disease or end stage renal disease: Secondary | ICD-10-CM | POA: Diagnosis present

## 2015-02-24 DIAGNOSIS — H540 Blindness, both eyes: Secondary | ICD-10-CM | POA: Diagnosis present

## 2015-02-24 DIAGNOSIS — K5900 Constipation, unspecified: Secondary | ICD-10-CM | POA: Diagnosis present

## 2015-02-24 DIAGNOSIS — E8770 Fluid overload, unspecified: Secondary | ICD-10-CM | POA: Diagnosis present

## 2015-02-24 DIAGNOSIS — D500 Iron deficiency anemia secondary to blood loss (chronic): Secondary | ICD-10-CM | POA: Diagnosis present

## 2015-02-24 DIAGNOSIS — R680 Hypothermia, not associated with low environmental temperature: Secondary | ICD-10-CM | POA: Diagnosis present

## 2015-02-24 DIAGNOSIS — Z9115 Patient's noncompliance with renal dialysis: Secondary | ICD-10-CM

## 2015-02-24 DIAGNOSIS — Z882 Allergy status to sulfonamides status: Secondary | ICD-10-CM | POA: Diagnosis not present

## 2015-02-24 DIAGNOSIS — R740 Nonspecific elevation of levels of transaminase and lactic acid dehydrogenase [LDH]: Secondary | ICD-10-CM | POA: Diagnosis present

## 2015-02-24 DIAGNOSIS — I150 Renovascular hypertension: Secondary | ICD-10-CM | POA: Diagnosis not present

## 2015-02-24 DIAGNOSIS — Z992 Dependence on renal dialysis: Secondary | ICD-10-CM

## 2015-02-24 DIAGNOSIS — Z993 Dependence on wheelchair: Secondary | ICD-10-CM

## 2015-02-24 DIAGNOSIS — K6289 Other specified diseases of anus and rectum: Secondary | ICD-10-CM | POA: Diagnosis present

## 2015-02-24 DIAGNOSIS — IMO0002 Reserved for concepts with insufficient information to code with codable children: Secondary | ICD-10-CM

## 2015-02-24 DIAGNOSIS — G8250 Quadriplegia, unspecified: Secondary | ICD-10-CM | POA: Diagnosis present

## 2015-02-24 DIAGNOSIS — D649 Anemia, unspecified: Secondary | ICD-10-CM | POA: Diagnosis present

## 2015-02-24 DIAGNOSIS — Z79899 Other long term (current) drug therapy: Secondary | ICD-10-CM | POA: Diagnosis not present

## 2015-02-24 DIAGNOSIS — F259 Schizoaffective disorder, unspecified: Secondary | ICD-10-CM | POA: Diagnosis present

## 2015-02-24 DIAGNOSIS — N2581 Secondary hyperparathyroidism of renal origin: Secondary | ICD-10-CM | POA: Diagnosis present

## 2015-02-24 DIAGNOSIS — Z91011 Allergy to milk products: Secondary | ICD-10-CM | POA: Diagnosis not present

## 2015-02-24 DIAGNOSIS — N186 End stage renal disease: Secondary | ICD-10-CM

## 2015-02-24 DIAGNOSIS — E1122 Type 2 diabetes mellitus with diabetic chronic kidney disease: Secondary | ICD-10-CM | POA: Diagnosis present

## 2015-02-24 DIAGNOSIS — F329 Major depressive disorder, single episode, unspecified: Secondary | ICD-10-CM | POA: Diagnosis present

## 2015-02-24 DIAGNOSIS — R195 Other fecal abnormalities: Secondary | ICD-10-CM | POA: Diagnosis present

## 2015-02-24 DIAGNOSIS — I10 Essential (primary) hypertension: Secondary | ICD-10-CM | POA: Diagnosis present

## 2015-02-24 DIAGNOSIS — R7989 Other specified abnormal findings of blood chemistry: Secondary | ICD-10-CM | POA: Diagnosis present

## 2015-02-24 DIAGNOSIS — Z888 Allergy status to other drugs, medicaments and biological substances status: Secondary | ICD-10-CM

## 2015-02-24 DIAGNOSIS — R229 Localized swelling, mass and lump, unspecified: Secondary | ICD-10-CM

## 2015-02-24 DIAGNOSIS — D696 Thrombocytopenia, unspecified: Secondary | ICD-10-CM | POA: Diagnosis present

## 2015-02-24 DIAGNOSIS — E0590 Thyrotoxicosis, unspecified without thyrotoxic crisis or storm: Secondary | ICD-10-CM | POA: Diagnosis present

## 2015-02-24 HISTORY — DX: Dependence on renal dialysis: N18.6

## 2015-02-24 HISTORY — DX: End stage renal disease: Z99.2

## 2015-02-24 LAB — COMPREHENSIVE METABOLIC PANEL
ALT: 19 U/L (ref 17–63)
AST: 34 U/L (ref 15–41)
Albumin: 4 g/dL (ref 3.5–5.0)
Alkaline Phosphatase: 188 U/L — ABNORMAL HIGH (ref 38–126)
Anion gap: 13 (ref 5–15)
BUN: 58 mg/dL — ABNORMAL HIGH (ref 6–20)
CO2: 23 mmol/L (ref 22–32)
Calcium: 9 mg/dL (ref 8.9–10.3)
Chloride: 103 mmol/L (ref 101–111)
Creatinine, Ser: 5.11 mg/dL — ABNORMAL HIGH (ref 0.61–1.24)
GFR calc Af Amer: 15 mL/min — ABNORMAL LOW (ref >60)
GFR calc non Af Amer: 13 mL/min — ABNORMAL LOW (ref >60)
Glucose, Bld: 223 mg/dL — ABNORMAL HIGH (ref 65–99)
Potassium: 4.6 mmol/L (ref 3.5–5.1)
Sodium: 139 mmol/L (ref 135–145)
Total Bilirubin: 0.8 mg/dL (ref 0.3–1.2)
Total Protein: 7.5 g/dL (ref 6.5–8.1)

## 2015-02-24 LAB — CBC WITH DIFFERENTIAL/PLATELET
Basophils Absolute: 0 10*3/uL (ref 0.0–0.1)
Basophils Relative: 0 %
Eosinophils Absolute: 0.1 10*3/uL (ref 0.0–0.7)
Eosinophils Relative: 1 %
HCT: 23 % — ABNORMAL LOW (ref 39.0–52.0)
Hemoglobin: 7.4 g/dL — ABNORMAL LOW (ref 13.0–17.0)
Lymphocytes Relative: 5 %
Lymphs Abs: 0.5 10*3/uL — ABNORMAL LOW (ref 0.7–4.0)
MCH: 31.4 pg (ref 26.0–34.0)
MCHC: 32.2 g/dL (ref 30.0–36.0)
MCV: 97.5 fL (ref 78.0–100.0)
Monocytes Absolute: 0.8 10*3/uL (ref 0.1–1.0)
Monocytes Relative: 8 %
Neutro Abs: 8.3 10*3/uL — ABNORMAL HIGH (ref 1.7–7.7)
Neutrophils Relative %: 86 %
Platelets: 145 10*3/uL — ABNORMAL LOW (ref 150–400)
RBC: 2.36 MIL/uL — ABNORMAL LOW (ref 4.22–5.81)
RDW: 15.1 % (ref 11.5–15.5)
WBC: 9.6 10*3/uL (ref 4.0–10.5)

## 2015-02-24 LAB — URINALYSIS, ROUTINE W REFLEX MICROSCOPIC
Bilirubin Urine: NEGATIVE
Glucose, UA: NEGATIVE mg/dL
Ketones, ur: NEGATIVE mg/dL
Nitrite: NEGATIVE
Protein, ur: >300 mg/dL — AB
Specific Gravity, Urine: 1.025 (ref 1.005–1.030)
pH: 7.5 (ref 5.0–8.0)

## 2015-02-24 LAB — POC OCCULT BLOOD, ED: Fecal Occult Bld: POSITIVE — AB

## 2015-02-24 LAB — I-STAT CG4 LACTIC ACID, ED: Lactic Acid, Venous: 2.49 mmol/L (ref 0.5–2.0)

## 2015-02-24 LAB — LACTIC ACID, PLASMA: Lactic Acid, Venous: 1.1 mmol/L (ref 0.5–2.0)

## 2015-02-24 LAB — APTT: aPTT: 36 seconds (ref 24–37)

## 2015-02-24 LAB — PROCALCITONIN: Procalcitonin: 0.62 ng/mL

## 2015-02-24 LAB — PROTIME-INR
INR: 1.16 (ref 0.00–1.49)
Prothrombin Time: 15 seconds (ref 11.6–15.2)

## 2015-02-24 LAB — URINE MICROSCOPIC-ADD ON

## 2015-02-24 LAB — PREPARE RBC (CROSSMATCH)

## 2015-02-24 LAB — ABO/RH: ABO/RH(D): B NEG

## 2015-02-24 LAB — GLUCOSE, CAPILLARY: Glucose-Capillary: 120 mg/dL — ABNORMAL HIGH (ref 65–99)

## 2015-02-24 LAB — TSH: TSH: 4.514 u[IU]/mL — ABNORMAL HIGH (ref 0.350–4.500)

## 2015-02-24 LAB — LIPASE, BLOOD: Lipase: 28 U/L (ref 11–51)

## 2015-02-24 LAB — BRAIN NATRIURETIC PEPTIDE: B Natriuretic Peptide: 542.5 pg/mL — ABNORMAL HIGH (ref 0.0–100.0)

## 2015-02-24 MED ORDER — SODIUM CHLORIDE 0.9 % IJ SOLN: 3.0000 mL | INTRAMUSCULAR | Status: DC | PRN

## 2015-02-24 MED ORDER — ONDANSETRON HCL 4 MG/2ML IJ SOLN: 4.0000 mg | Freq: Four times a day (QID) | INTRAMUSCULAR | Status: DC | PRN

## 2015-02-24 MED ORDER — CINACALCET HCL 30 MG PO TABS
30.0000 mg | ORAL_TABLET | Freq: Every day | ORAL | Status: DC
  Administered 2015-02-24 – 2015-02-27 (×4): 30 mg via ORAL
  Filled 2015-02-24 (×5): qty 1

## 2015-02-24 MED ORDER — CICLOPIROX 8 % EX SOLN: 1.0000 "application " | Freq: Every day | CUTANEOUS | Status: DC

## 2015-02-24 MED ORDER — INSULIN GLARGINE 100 UNIT/ML ~~LOC~~ SOLN
10.0000 [IU] | Freq: Every day | SUBCUTANEOUS | Status: DC
  Administered 2015-02-24 – 2015-02-26 (×3): 10 [IU] via SUBCUTANEOUS
  Filled 2015-02-24 (×4): qty 0.1

## 2015-02-24 MED ORDER — ATROPINE SULFATE 1 % OP SOLN
1.0000 [drp] | Freq: Two times a day (BID) | OPHTHALMIC | Status: DC
  Administered 2015-02-24 – 2015-02-27 (×6): 1 [drp] via OPHTHALMIC
  Filled 2015-02-24: qty 2

## 2015-02-24 MED ORDER — ONDANSETRON HCL 4 MG PO TABS: 4.0000 mg | ORAL_TABLET | Freq: Four times a day (QID) | ORAL | Status: DC | PRN

## 2015-02-24 MED ORDER — SODIUM CHLORIDE 0.9 % IJ SOLN
3.0000 mL | Freq: Two times a day (BID) | INTRAMUSCULAR | Status: DC
  Administered 2015-02-25 – 2015-02-26 (×3): 3 mL via INTRAVENOUS

## 2015-02-24 MED ORDER — AMLODIPINE BESYLATE 10 MG PO TABS
10.0000 mg | ORAL_TABLET | Freq: Every day | ORAL | Status: DC
  Administered 2015-02-25: 10 mg via ORAL
  Filled 2015-02-24: qty 1

## 2015-02-24 MED ORDER — HYDRALAZINE HCL 50 MG PO TABS
100.0000 mg | ORAL_TABLET | Freq: Three times a day (TID) | ORAL | Status: DC
  Administered 2015-02-24 – 2015-02-26 (×6): 100 mg via ORAL
  Filled 2015-02-24 (×6): qty 2

## 2015-02-24 MED ORDER — ACETAMINOPHEN 325 MG PO TABS
650.0000 mg | ORAL_TABLET | Freq: Four times a day (QID) | ORAL | Status: DC | PRN
Start: 2015-02-24 — End: 2015-02-27

## 2015-02-24 MED ORDER — RENA-VITE PO TABS
1.0000 | ORAL_TABLET | Freq: Every day | ORAL | Status: DC
  Administered 2015-02-25 – 2015-02-27 (×3): 1 via ORAL
  Filled 2015-02-24 (×3): qty 1

## 2015-02-24 MED ORDER — INSULIN ASPART 100 UNIT/ML ~~LOC~~ SOLN
0.0000 [IU] | Freq: Three times a day (TID) | SUBCUTANEOUS | Status: DC
  Administered 2015-02-25: 2 [IU] via SUBCUTANEOUS
  Administered 2015-02-26: 3 [IU] via SUBCUTANEOUS
  Administered 2015-02-26: 2 [IU] via SUBCUTANEOUS
  Administered 2015-02-27: 1 [IU] via SUBCUTANEOUS

## 2015-02-24 MED ORDER — PREDNISOLONE ACETATE 1 % OP SUSP
1.0000 [drp] | Freq: Two times a day (BID) | OPHTHALMIC | Status: DC
  Administered 2015-02-24 – 2015-02-27 (×6): 1 [drp] via OPHTHALMIC
  Filled 2015-02-24: qty 1

## 2015-02-24 MED ORDER — SODIUM CHLORIDE 0.9 % IV SOLN
Freq: Once | INTRAVENOUS | Status: AC
Start: 2015-02-24 — End: 2015-02-24
  Administered 2015-02-24: 21:00:00 via INTRAVENOUS

## 2015-02-24 MED ORDER — ACETAMINOPHEN 650 MG RE SUPP: 650.0000 mg | Freq: Four times a day (QID) | RECTAL | Status: DC | PRN

## 2015-02-24 MED ORDER — LORAZEPAM 1 MG PO TABS
1.0000 mg | ORAL_TABLET | Freq: Four times a day (QID) | ORAL | Status: DC | PRN
  Administered 2015-02-27: 1 mg via ORAL
  Filled 2015-02-24: qty 1

## 2015-02-24 MED ORDER — SODIUM CHLORIDE 0.9 % IV SOLN: 250.0000 mL | INTRAVENOUS | Status: DC | PRN

## 2015-02-24 MED ORDER — PANTOPRAZOLE SODIUM 40 MG PO TBEC: 40.0000 mg | DELAYED_RELEASE_TABLET | Freq: Every day | ORAL | Status: DC

## 2015-02-24 MED ORDER — PANTOPRAZOLE SODIUM 40 MG IV SOLR
40.0000 mg | INTRAVENOUS | Status: DC
  Administered 2015-02-24: 40 mg via INTRAVENOUS
  Filled 2015-02-24 (×2): qty 40

## 2015-02-24 MED ORDER — SODIUM CHLORIDE 0.9 % IJ SOLN
3.0000 mL | Freq: Two times a day (BID) | INTRAMUSCULAR | Status: DC
  Administered 2015-02-24 – 2015-02-26 (×3): 3 mL via INTRAVENOUS

## 2015-02-24 MED ORDER — HYDROCODONE-ACETAMINOPHEN 5-325 MG PO TABS: 1.0000 | ORAL_TABLET | ORAL | Status: DC | PRN

## 2015-02-24 MED ORDER — HYDROMORPHONE HCL 1 MG/ML IJ SOLN: 0.5000 mg | INTRAMUSCULAR | Status: DC | PRN

## 2015-02-24 MED ORDER — POLYETHYLENE GLYCOL 3350 17 G PO PACK
34.0000 g | PACK | Freq: Every day | ORAL | Status: DC
  Administered 2015-02-24 – 2015-02-27 (×2): 34 g via ORAL
  Filled 2015-02-24 (×4): qty 2

## 2015-02-24 MED ORDER — SIMVASTATIN 20 MG PO TABS
20.0000 mg | ORAL_TABLET | Freq: Every day | ORAL | Status: DC
  Administered 2015-02-24 – 2015-02-26 (×3): 20 mg via ORAL
  Filled 2015-02-24 (×3): qty 1

## 2015-02-24 MED ORDER — SEVELAMER CARBONATE 800 MG PO TABS
800.0000 mg | ORAL_TABLET | Freq: Three times a day (TID) | ORAL | Status: DC
  Administered 2015-02-25 – 2015-02-27 (×7): 800 mg via ORAL
  Filled 2015-02-24 (×6): qty 1

## 2015-02-24 MED ORDER — BISACODYL 5 MG PO TBEC
10.0000 mg | DELAYED_RELEASE_TABLET | Freq: Once | ORAL | Status: AC
  Administered 2015-02-24: 10 mg via ORAL
  Filled 2015-02-24: qty 2

## 2015-02-24 MED ORDER — RISPERIDONE 2 MG PO TABS
2.0000 mg | ORAL_TABLET | Freq: Two times a day (BID) | ORAL | Status: DC
  Administered 2015-02-24 – 2015-02-27 (×6): 2 mg via ORAL
  Filled 2015-02-24 (×9): qty 1

## 2015-02-24 MED ORDER — TEMAZEPAM 15 MG PO CAPS
15.0000 mg | ORAL_CAPSULE | Freq: Every day | ORAL | Status: DC
  Administered 2015-02-24 – 2015-02-26 (×3): 15 mg via ORAL
  Filled 2015-02-24 (×3): qty 1

## 2015-02-24 MED ORDER — CLONIDINE HCL 0.1 MG PO TABS
0.1000 mg | ORAL_TABLET | Freq: Three times a day (TID) | ORAL | Status: DC
  Administered 2015-02-24 – 2015-02-25 (×4): 0.1 mg via ORAL
  Filled 2015-02-24 (×4): qty 1

## 2015-02-24 MED ORDER — NITROGLYCERIN 0.4 MG SL SUBL: 0.4000 mg | SUBLINGUAL_TABLET | SUBLINGUAL | Status: DC | PRN

## 2015-02-24 NOTE — ED Notes (Addendum)
Pt complains of constipation since last night. Pt's caregiver states the pt tried to have a bowel movement today, pt sat on toilet for 4 hours. Pt caregiver states the pt had a small bowel movement that was harder than normal at 2pm today, caregiver states he felt more stool in pt's rectum. Pt denies pain

## 2015-02-24 NOTE — ED Notes (Signed)
Attempted to manually disimpact patient, unsuccessful attempt.

## 2015-02-24 NOTE — ED Notes (Signed)
Abnormal lab results given to Dr Alvino Chapel

## 2015-02-24 NOTE — H&P (Signed)
[facility] History and Physical  [name] [id number] DOB: Apr 10, 1970 DOA: 02/24/2015  Referring physician: ED physician PCP: Theodore Font, MD  Specialists: Dr. Baxter Montgomery (ID)  Chief Complaint:  Constipation   HPI: [name] is a 45 y.o. male with PMH of end-stage renal disease on hemodialysis Monday, Wednesday, and Friday, insulin-dependent diabetes mellitus, hyperthyroidism, quadriparesis, and schizophrenia who presents to the ED with constipation of 4 days duration. Per report of the patient's caregiver, [name] spent 4 hours on the toilet earlier today and was only able to pass a small hard stool. He has complained of rectal pain, but has maintained in his usual activity level. He has denied fevers, but endorses chills. He denies abdominal pain, chest pain, palpitations, or headaches. He denies any recent change in his diet or medications. He has had similar symptoms previously, but not this bad. He denies nausea, vomiting, melena, or hematochezia. He describes rectal pain as a dull, constant ache, severe in intensity, and worse with bearing down. The patient missed his regularly scheduled HD session today due to the current illness, but is rescheduled for tomorrow.  In ED, patient was found to be hypothermic to 35.5 C rectal, saturating well on room air, with borderline tachycardia and hypertension. Chest x-ray was negative for acute cardiopulmonary disease but there was a paratracheal soft tissue density that will need follow-up with advanced imaging. Initial blood work obtained in the ED return notable for a lactic acid of 2.49, and hemoglobin of 7.4. On rectal exam, there was no frank blood or melena, but occult blood testing was positive. Patient was monitored in the emergency department, remained hemodynamically stable, and was admitted to the hospital for ongoing evaluation and management of acute constipation with elevated lactate and significant anemia with FOBT  positive.  Where does patient live?   Group home Can patient participate in ADLs?  Some   Review of Systems:   General:  Chills, no fevers, sweats, weight change, poor appetite, or fatigue HEENT: no blurry vision, hearing changes or sore throat Pulm: no dyspnea, cough, or wheeze CV: no chest pain or palpitations Abd: no nausea, vomiting, abdominal pain, or diarrhea. Constipation and rectal pain GU: no dysuria, hematuria, increased urinary frequency, or urgency  Ext: no leg edema Neuro: Quadraparetic, no vision change or hearing loss Skin: no rash, no wounds MSK: No muscle spasm, no deformity, no red, hot, or swollen joint Heme: No easy bruising or bleeding Travel history: No recent long distant travel    Allergy:  Allergies  Allergen Reactions  . Lactose Intolerance (Gi) Diarrhea  . Polymyxin B Swelling    "Can't take this eye drop," per outside records.   . Sulfamethoxazole Rash    "Swelling of area, face, from drop that had sulfa," per outside records.     Past Medical History  Diagnosis Date  . Diabetes mellitus   . Hyperthyroidism   . Quadriparesis (muscle weakness) ([place])   . Constipation   . Chronic kidney disease esrd  . Schizo-affective schizophrenia ([place])   . Hypertension   . Depression     Past Surgical History  Procedure Laterality Date  . Dialysis fistula creation    . Multiple extractions with alveoloplasty  01/17/2011    Procedure: MULTIPLE EXTRACION WITH ALVEOLOPLASTY;  Surgeon: Gae Bon;  Location: [facility] OR;  Service: Oral Surgery;  Laterality: Bilateral;  Extraction of number one, sixteen, seventeen, and thirty two.     Social History:  reports that he has never  smoked. He does not have any smokeless tobacco history on file. He reports that he does not drink alcohol or use illicit drugs.  Family History:  Family History  Problem Relation Age of Onset  . Family history unknown: Yes     Prior to Admission medications   Medication Sig Start  Date End Date Taking? Authorizing Provider  amLODipine (NORVASC) 10 MG tablet Take 10 mg by mouth daily.     Yes Historical Provider, MD  atropine 1 % ophthalmic solution Place 1 drop into the left eye 2 (two) times daily. 07/16/14  Yes Historical Provider, MD  ciclopirox (PENLAC) 8 % solution Apply 1 application topically at bedtime. Apply over nail and surrounding skin. Apply daily over previous coat. After seven (7) days, may remove with alcohol and continue cycle.   Yes Historical Provider, MD  cinacalcet (SENSIPAR) 30 MG tablet Take 30 mg by mouth daily.     Yes Historical Provider, MD  cloNIDine (CATAPRES) 0.1 MG tablet Take 0.1 mg by mouth 3 (three) times daily.   Yes Historical Provider, MD  hydrALAZINE (APRESOLINE) 100 MG tablet Take 100 mg by mouth 3 (three) times daily.     Yes Historical Provider, MD  insulin glargine (LANTUS) 100 UNIT/ML injection Inject 10 Units into the skin daily.    Yes Historical Provider, MD  insulin lispro (HUMALOG) 100 UNIT/ML injection Inject 5 Units into the skin 3 (three) times daily before meals. Inject additional units for blood sugar levels that are 301-350=6 units, 351-400=8 units.   Yes Historical Provider, MD  ketoconazole (NIZORAL) 2 % cream Apply 1 application topically 2 (two) times daily.   Yes Historical Provider, MD  LORazepam (ATIVAN) 1 MG tablet Take 1 mg by mouth every 8 (eight) hours as needed. For anxiety    Yes Historical Provider, MD  multivitamin (RENA-VIT) TABS tablet Take 1 tablet by mouth daily.     Yes Historical Provider, MD  nitroGLYCERIN (NITROSTAT) 0.4 MG SL tablet Place 0.4 mg under the tongue every 5 (five) minutes as needed. For chest pain    Yes Historical Provider, MD  pantoprazole (PROTONIX) 40 MG tablet Take 40 mg by mouth daily.     Yes Historical Provider, MD  prednisoLONE acetate (PRED FORTE) 1 % ophthalmic suspension Place 1 drop into the left eye 2 (two) times daily. 10/20/14  Yes Historical Provider, MD  risperiDONE  (RISPERDAL) 2 MG tablet Take 2 mg by mouth 2 (two) times daily.     Yes Historical Provider, MD  sevelamer (RENVELA) 800 MG tablet Take 800 mg by mouth 3 (three) times daily.     Yes Historical Provider, MD  simvastatin (ZOCOR) 20 MG tablet Take 20 mg by mouth at bedtime.     Yes Historical Provider, MD  temazepam (RESTORIL) 15 MG capsule Take 15 mg by mouth at bedtime.     Yes Historical Provider, MD  labetalol (NORMODYNE) 200 MG tablet Take 200 mg by mouth 3 (three) times daily.      Historical Provider, MD  losartan (COZAAR) 100 MG tablet Take 100 mg by mouth daily.      Historical Provider, MD    Physical Exam: Filed Vitals:   02/24/15 1620 02/24/15 1625 02/24/15 1637 02/24/15 1759  BP:   159/102 168/113  Pulse:   95 98  Temp: 95.9 F (35.5 C) 94.7 F (34.8 C)    TempSrc: Axillary Rectal    Resp:   18 18  SpO2:   100% 100%  General: Not in acute distress, though in obvious discomfort  HEENT:       Eyes: No periorbital edema, no scleral icterus or conjunctival pallor.       ENT: No discharge from the ears or nose, no pharyngeal ulcers, petechiae or exudate, no tonsillar enlargement.        Neck:  Neck veins modestly distended, no bruit, no appreciable mass Heme: No cervical adenopathy, no pallor Cardiac: S1/S2, RRR, No appreciable murmurs, No gallops or rubs. Pulm: Good air movement bilaterally. No rales, wheezing, rhonchi or rubs. Abd: Soft, nondistended, nontender, no rebound pain or gaurding, no mass or organomegaly, BS present. Ext:  Pretibial pitting edema b/l, Lt > Rt.  1+DP/PT pulse bilaterally. Musculoskeletal:  Muscle atrophy and flexion contractures, no red, hot, swollen joints, no limitation in ROM  Skin: No rashes or wounds on exposed surfaces  Neuro: Alert, oriented X3, cranial nerves II-XII grossly intact, PERRL, EOMI Psych: Patient is not overtly psychotic, tearful, voicing frustration with chronic illnesses.  Labs on Admission:  Basic Metabolic Panel:  Recent  Labs Lab 02/24/15 1707  NA 139  K 4.6  CL 103  CO2 23  GLUCOSE 223*  BUN 58*  CREATININE 5.11*  CALCIUM 9.0   Liver Function Tests:  Recent Labs Lab 02/24/15 1707  AST 34  ALT 19  ALKPHOS 188*  BILITOT 0.8  PROT 7.5  ALBUMIN 4.0    Recent Labs Lab 02/24/15 1707  LIPASE 28   No results for input(s): AMMONIA in the last 168 hours. CBC:  Recent Labs Lab 02/24/15 1707  WBC 9.6  NEUTROABS 8.3*  HGB 7.4*  HCT 23.0*  MCV 97.5  PLT 145*   Cardiac Enzymes: No results for input(s): CKTOTAL, CKMB, CKMBINDEX, TROPONINI in the last 168 hours.  BNP (last 3 results) No results for input(s): BNP in the last 8760 hours.  ProBNP (last 3 results) No results for input(s): PROBNP in the last 8760 hours.  CBG: No results for input(s): GLUCAP in the last 168 hours.  Radiological Exams on Admission: Dg Chest 2 View  02/24/2015  CLINICAL DATA:  Constipation, weakness, quadriplegia EXAM: CHEST  2 VIEW COMPARISON:  11/24/2010 FINDINGS: Cardiac size is stable. No acute infiltrate or pulmonary edema. Mild basilar atelectasis. There is soft tissue prominence right paratracheal region. Mass or adenopathy cannot be excluded. Further correlation with enhanced CT scan of the chest is recommended. IMPRESSION: No infiltrate or pulmonary edema. There is soft tissue prominence in right upper paratracheal region. Mass or adenopathy cannot be excluded. Further correlation with CT scan of the chest is recommended. Electronically Signed   By: Lahoma Crocker M.D.   On: 02/24/2015 18:21    EKG:   Not done in ED, will obtain as appropriate   Assessment/Plan  1. Normocytic anemia with occult GI bleeding  - Hgb 7.4 on admission, down from apparent baseline of 9-11  - There is a component of chronic anemia of kidney disease, but concern for superimposed GI blood loss  - The baseline chronic anemia could appear worse today d/t missed HD session and mild volume o/l, but degree of anemia worrisome for  active blood loss  - FOBT positive, no frank blood, hematochezia, or external hemorrhoids on DRE - VTE ppx with SCDs only for now  - Blood bank asked to prepare 2 units, will plan to transfuse if Hgb drops lower than 7  - Repeat H/H overnight, monitor for bleed  - Continue patient's home Protonix, convert to IV  2. Hypothermia  - Temp 35.5 C rectal on presentation   - Uncertain etio; sepsis considered, but pt non-toxic appearing and without leukocytosis - Blood cultures have been obtained, watching off abx  - Will check thyroid studies  - Warming blanket   3. Elevated lactic acid  - Infection possible, but no obvious source, pt non-toxic appearing, and no leukocytosis  - Bowel ischemia considered given occult GI bleeding, but abd exam is benign  - Will trend lactate, keeping SLIV for now given ESRD and missed HD session   4. Hyperthyroidism  - Not on any medications for this; not clear whether he had ablation   - Check thyroid studies in light of hypothermia   5. Acute constipation  - Abd exam is benign, bowel sounds active  - Reports 1 small hard stool today, last normal BM ~4 days ago - Will check KUB, likely proceed with aggressive bowel regimen if no suggestion of obstruction   6. Thrombocytopenia  - Mild, uncertain etio  - May some dilutional component as pt missed HD today and has moderate fluid overloaded  - Holding pharmacologic VTE ppx given severe anemia  - Trend   7. ESRD  - Normally gets HD MWF, but missed today's session d/t the current illness  - No emergent indications for HD at this time  - Will admit to [facility] so maintenance HD can be performed there if pt not discharged early in the day     DVT ppx: SCDs  Code Status: Full code Family Communication: None at bed side.     Disposition Plan: Admit to inpatient   Date of Service 02/24/2015    Vianne Bulls, MD [facility] Pager [phone]  If 7PM-7AM, please contact  night-coverage [email or web address] Password [facility] 02/24/2015, 7:42 PM

## 2015-02-24 NOTE — ED Notes (Signed)
Delay in lab draw,  Pt not in room 

## 2015-02-24 NOTE — ED Provider Notes (Signed)
CSN: [id number]     Arrival date & time 02/24/15  1549 History   First MD Initiated Contact with Patient 02/24/15 1627     Chief Complaint  Patient presents with  . Constipation     (Consider location/radiation/quality/duration/timing/severity/associated sxs/prior Treatment) HPI   Pt is a 45 yo male with PMH of DM, ESRD (on hemodialysis M/W/F), quadriparesis, schizophrenia, depression and constipation who presents to the ED with complaint of constipation, onset 2 days. Pt is from a group home. Pt reports being constipated for the past few days. Pt's caregiver at bedside reports that today the pt was in the bathroom for appx. 4 hours trying to have a BM and was only able to have one BM with he describes as a small hard stool. Caregiver denies any changes in pt's baseline behavior or complaining of any pain/complaints. Caregiver reports that the pt missed his dialysis tx today but notes they have it rescheduled for tomorrow. Pt endorses having chills that started today. Pt denies fever, headache, lightheadedness, dizziness, cough, SOB, CP, abdominal pain, N/V/D, blood in urine or stool, numbness, tingling, weakness. Pt denies taking any medications for his constipation PTA.  Past Medical History  Diagnosis Date  . Diabetes mellitus   . Hyperthyroidism   . Quadriparesis (muscle weakness) ([place])   . Constipation   . Chronic kidney disease esrd  . Schizo-affective schizophrenia ([place])   . Hypertension   . Depression    Past Surgical History  Procedure Laterality Date  . Dialysis fistula creation    . Multiple extractions with alveoloplasty  01/17/2011    Procedure: MULTIPLE EXTRACION WITH ALVEOLOPLASTY;  Surgeon: Gae Bon;  Location: [facility] OR;  Service: Oral Surgery;  Laterality: Bilateral;  Extraction of number one, sixteen, seventeen, and thirty two.    Family History  Problem Relation Age of Onset  . Family history unknown: Yes   Social History  Substance Use Topics  . Smoking  status: Never Smoker   . Smokeless tobacco: None  . Alcohol Use: No    Review of Systems  Constitutional: Positive for chills.  Gastrointestinal: Positive for constipation.      Allergies  Lactose intolerance (gi); Polymyxin b; and Sulfamethoxazole  Home Medications   Prior to Admission medications   Medication Sig Start Date End Date Taking? Authorizing Provider  amLODipine (NORVASC) 10 MG tablet Take 10 mg by mouth daily.     Yes Historical Provider, MD  atropine 1 % ophthalmic solution Place 1 drop into the left eye 2 (two) times daily. 07/16/14  Yes Historical Provider, MD  ciclopirox (PENLAC) 8 % solution Apply 1 application topically at bedtime. Apply over nail and surrounding skin. Apply daily over previous coat. After seven (7) days, may remove with alcohol and continue cycle.   Yes Historical Provider, MD  cinacalcet (SENSIPAR) 30 MG tablet Take 30 mg by mouth daily.     Yes Historical Provider, MD  cloNIDine (CATAPRES) 0.1 MG tablet Take 0.1 mg by mouth 3 (three) times daily.   Yes Historical Provider, MD  hydrALAZINE (APRESOLINE) 100 MG tablet Take 100 mg by mouth 3 (three) times daily.     Yes Historical Provider, MD  insulin glargine (LANTUS) 100 UNIT/ML injection Inject 10 Units into the skin daily.    Yes Historical Provider, MD  insulin lispro (HUMALOG) 100 UNIT/ML injection Inject 5 Units into the skin 3 (three) times daily before meals. Inject additional units for blood sugar levels that are 301-350=6 units, 351-400=8 units.   Yes  Historical Provider, MD  ketoconazole (NIZORAL) 2 % cream Apply 1 application topically 2 (two) times daily.   Yes Historical Provider, MD  LORazepam (ATIVAN) 1 MG tablet Take 1 mg by mouth every 8 (eight) hours as needed. For anxiety    Yes Historical Provider, MD  multivitamin (RENA-VIT) TABS tablet Take 1 tablet by mouth daily.     Yes Historical Provider, MD  nitroGLYCERIN (NITROSTAT) 0.4 MG SL tablet Place 0.4 mg under the tongue every 5  (five) minutes as needed. For chest pain    Yes Historical Provider, MD  pantoprazole (PROTONIX) 40 MG tablet Take 40 mg by mouth daily.     Yes Historical Provider, MD  prednisoLONE acetate (PRED FORTE) 1 % ophthalmic suspension Place 1 drop into the left eye 2 (two) times daily. 10/20/14  Yes Historical Provider, MD  risperiDONE (RISPERDAL) 2 MG tablet Take 2 mg by mouth 2 (two) times daily.     Yes Historical Provider, MD  sevelamer (RENVELA) 800 MG tablet Take 800 mg by mouth 3 (three) times daily.     Yes Historical Provider, MD  simvastatin (ZOCOR) 20 MG tablet Take 20 mg by mouth at bedtime.     Yes Historical Provider, MD  temazepam (RESTORIL) 15 MG capsule Take 15 mg by mouth at bedtime.     Yes Historical Provider, MD  labetalol (NORMODYNE) 200 MG tablet Take 200 mg by mouth 3 (three) times daily.      Historical Provider, MD  losartan (COZAAR) 100 MG tablet Take 100 mg by mouth daily.      Historical Provider, MD   BP 167/91 mmHg  Pulse 96  Temp(Src) 98.8 F (37.1 C) (Rectal)  Resp 10  SpO2 100% Physical Exam  Constitutional: He is oriented to person, place, and time. He appears well-developed and well-nourished. No distress.  HENT:  Head: Normocephalic and atraumatic.  Right Ear: Tympanic membrane normal.  Left Ear: Tympanic membrane normal.  Nose: Nose normal.  Mouth/Throat: Uvula is midline, oropharynx is clear and moist and mucous membranes are normal. No oropharyngeal exudate.  Eyes: Conjunctivae and EOM are normal. Pupils are equal, round, and reactive to light. Right eye exhibits no discharge. Left eye exhibits no discharge. No scleral icterus.  Neck: Normal range of motion. Neck supple.  Cardiovascular: Normal rate, regular rhythm, normal heart sounds and intact distal pulses.   Pulmonary/Chest: Effort normal and breath sounds normal. No respiratory distress. He has no wheezes. He has no rales. He exhibits no tenderness.  Abdominal: Soft. Bowel sounds are normal. He  exhibits no distension and no mass. There is no tenderness. There is no rebound and no guarding.  Genitourinary: Rectal exam shows no external hemorrhoid, no internal hemorrhoid, no fissure, no tenderness and anal tone normal.  Hard stool noted in rectum. No gross blood on exam.  Musculoskeletal: He exhibits no edema or tenderness.  Paresis noted to BLE, sensation intact. 2+ PT pulses.   Lymphadenopathy:    He has no cervical adenopathy.  Neurological: He is alert and oriented to person, place, and time. No sensory deficit.  Skin: Skin is warm and dry. He is not diaphoretic.  Nursing note and vitals reviewed.   ED Course  Procedures (including critical care time) Labs Review Labs Reviewed  URINALYSIS, ROUTINE W REFLEX MICROSCOPIC (NOT AT [facility]) - Abnormal; Notable for the following:    Color, Urine AMBER (*)    APPearance TURBID (*)    Hgb urine dipstick SMALL (*)    Protein,  ur >300 (*)    Leukocytes, UA LARGE (*)    All other components within normal limits  CBC WITH DIFFERENTIAL/PLATELET - Abnormal; Notable for the following:    RBC 2.36 (*)    Hemoglobin 7.4 (*)    HCT 23.0 (*)    Platelets 145 (*)    Neutro Abs 8.3 (*)    Lymphs Abs 0.5 (*)    All other components within normal limits  COMPREHENSIVE METABOLIC PANEL - Abnormal; Notable for the following:    Glucose, Bld 223 (*)    BUN 58 (*)    Creatinine, Ser 5.11 (*)    Alkaline Phosphatase 188 (*)    GFR calc non Af Amer 13 (*)    GFR calc Af Amer 15 (*)    All other components within normal limits  URINE MICROSCOPIC-ADD ON - Abnormal; Notable for the following:    Squamous Epithelial / LPF 0-5 (*)    Bacteria, UA MANY (*)    All other components within normal limits  I-STAT CG4 LACTIC ACID, ED - Abnormal; Notable for the following:    Lactic Acid, Venous 2.49 (*)    All other components within normal limits  POC OCCULT BLOOD, ED - Abnormal; Notable for the following:    Fecal Occult Bld POSITIVE (*)    All other  components within normal limits  URINE CULTURE  CULTURE, BLOOD (ROUTINE X 2)  CULTURE, BLOOD (ROUTINE X 2)  LIPASE, BLOOD  PROTIME-INR  APTT  LACTIC ACID, PLASMA  PROCALCITONIN  COMPREHENSIVE METABOLIC PANEL  CBC WITH DIFFERENTIAL/PLATELET  TSH  BRAIN NATRIURETIC PEPTIDE  URINALYSIS, ROUTINE W REFLEX MICROSCOPIC (NOT AT [facility])  OCCULT BLOOD X 1 CARD TO LAB, STOOL  HEMOGLOBIN A1C  LACTIC ACID, PLASMA  HEMOGLOBIN AND HEMATOCRIT, BLOOD  T4, FREE  TYPE AND SCREEN  PREPARE RBC (CROSSMATCH)  ABO/RH    Imaging Review Dg Chest 2 View  02/24/2015  CLINICAL DATA:  Constipation, weakness, quadriplegia EXAM: CHEST  2 VIEW COMPARISON:  11/24/2010 FINDINGS: Cardiac size is stable. No acute infiltrate or pulmonary edema. Mild basilar atelectasis. There is soft tissue prominence right paratracheal region. Mass or adenopathy cannot be excluded. Further correlation with enhanced CT scan of the chest is recommended. IMPRESSION: No infiltrate or pulmonary edema. There is soft tissue prominence in right upper paratracheal region. Mass or adenopathy cannot be excluded. Further correlation with CT scan of the chest is recommended. Electronically Signed   By: Lahoma Crocker M.D.   On: 02/24/2015 18:21   Abd 1 View (kub)  02/24/2015  CLINICAL DATA:  Constipation starting last night, quadriparesis EXAM: ABDOMEN - 1 VIEW COMPARISON:  None. FINDINGS: Minimal gaseous distended small bowel loops lower abdomen without air-fluid levels. Abundant stool noted in right colon. Moderate stool and gas noted in descending colon and sigmoid colon. Atherosclerotic calcifications of femoral and iliac arteries. IMPRESSION: No evidence of small bowel obstruction. Colonic stool as described above. Electronically Signed   By: Lahoma Crocker M.D.   On: 02/24/2015 20:05   I have personally reviewed and evaluated these images and lab results as part of my medical decision-making.  Filed Vitals:   02/24/15 2045 02/24/15 2130  BP:     Pulse:    Temp: 97.5 F (36.4 C) 98.8 F (37.1 C)  Resp:       MDM   Final diagnoses:  Anemia, unspecified anemia type  Elevated lactic acid level    Patient presents with constipation for the past 2-3 days, denies  taking any medications prior to arrival. Patient currently lives at a group home. Hx of quadriparesis, ESRD (on dialysis), schizophrenia. Temp 95.9, bear hugger and warm blanket placed on pt. HR 98, BP 145/87, O2 100% on RA. Exam unremarkable. Rectal exam revealed hard stool in rectum. Hemoccult positive. Hgb 7.4. Lactic Acid 2.49.   Caregiver reports pt has been hypothermic in the past when the pt was dx with pneumonia. CXR revealed no infiltrate or pulmonary edema. Repeat temp 98.8. Consulted hospitalist. Dr. Myna Hidalgo agrees to admission, however due to pt missing his dialysis tx today he advised that he will notify Dr. Loleta Books at [facility] to have pt transferred to [facility] for admission so that the pt will be able to have dialysis. Discussed results and plan for admission with pt.   Chesley Noon [place], [place] 02/24/15 2141  Davonna Belling, MD 02/25/15 [phone]

## 2015-02-24 NOTE — ED Notes (Signed)
Patient transported to X-ray 

## 2015-02-25 ENCOUNTER — Encounter (HOSPITAL_COMMUNITY): Payer: Self-pay | Admitting: General Practice

## 2015-02-25 ENCOUNTER — Inpatient Hospital Stay (HOSPITAL_COMMUNITY): Payer: Medicaid Other

## 2015-02-25 LAB — CBC
HCT: 20.7 % — ABNORMAL LOW (ref 39.0–52.0)
Hemoglobin: 6.8 g/dL — CL (ref 13.0–17.0)
MCH: 31.8 pg (ref 26.0–34.0)
MCHC: 32.9 g/dL (ref 30.0–36.0)
MCV: 96.7 fL (ref 78.0–100.0)
Platelets: 124 10*3/uL — ABNORMAL LOW (ref 150–400)
RBC: 2.14 MIL/uL — ABNORMAL LOW (ref 4.22–5.81)
RDW: 15.6 % — ABNORMAL HIGH (ref 11.5–15.5)
WBC: 7.7 10*3/uL (ref 4.0–10.5)

## 2015-02-25 LAB — HEMOGLOBIN A1C
Hgb A1c MFr Bld: 6.4 % — ABNORMAL HIGH (ref 4.8–5.6)
Mean Plasma Glucose: 137 mg/dL

## 2015-02-25 LAB — MRSA PCR SCREENING: MRSA by PCR: NEGATIVE

## 2015-02-25 LAB — GLUCOSE, CAPILLARY
Glucose-Capillary: 76 mg/dL (ref 65–99)
Glucose-Capillary: 109 mg/dL — ABNORMAL HIGH (ref 65–99)
Glucose-Capillary: 180 mg/dL — ABNORMAL HIGH (ref 65–99)
Glucose-Capillary: 280 mg/dL — ABNORMAL HIGH (ref 65–99)

## 2015-02-25 LAB — PREPARE RBC (CROSSMATCH)

## 2015-02-25 LAB — T4, FREE: Free T4: 0.78 ng/dL (ref 0.61–1.12)

## 2015-02-25 MED ORDER — SODIUM CHLORIDE 0.9 % IV SOLN: Freq: Once | INTRAVENOUS | Status: DC

## 2015-02-25 MED ORDER — PRO-STAT SUGAR FREE PO LIQD
30.0000 mL | Freq: Two times a day (BID) | ORAL | Status: DC
  Administered 2015-02-25 – 2015-02-27 (×4): 30 mL via ORAL
  Filled 2015-02-25 (×5): qty 30

## 2015-02-25 MED ORDER — PANTOPRAZOLE SODIUM 40 MG PO TBEC
40.0000 mg | DELAYED_RELEASE_TABLET | Freq: Every day | ORAL | Status: DC
  Administered 2015-02-25 – 2015-02-26 (×2): 40 mg via ORAL
  Filled 2015-02-25 (×2): qty 1

## 2015-02-25 MED ORDER — SODIUM CHLORIDE 0.9 % IV SOLN
1750.0000 mg | Freq: Once | INTRAVENOUS | Status: AC
  Administered 2015-02-25: 1750 mg via INTRAVENOUS
  Filled 2015-02-25: qty 1750

## 2015-02-25 MED ORDER — SENNOSIDES-DOCUSATE SODIUM 8.6-50 MG PO TABS
1.0000 | ORAL_TABLET | Freq: Two times a day (BID) | ORAL | Status: DC
  Administered 2015-02-25 – 2015-02-27 (×5): 1 via ORAL
  Filled 2015-02-25 (×5): qty 1

## 2015-02-25 MED ORDER — AMLODIPINE BESYLATE 10 MG PO TABS
10.0000 mg | ORAL_TABLET | Freq: Every day | ORAL | Status: DC
  Administered 2015-02-26: 10 mg via ORAL
  Filled 2015-02-25: qty 1

## 2015-02-25 MED ORDER — NEPRO/CARBSTEADY PO LIQD: 237.0000 mL | Freq: Two times a day (BID) | ORAL | Status: DC

## 2015-02-25 MED ORDER — BISACODYL 10 MG RE SUPP
10.0000 mg | Freq: Once | RECTAL | Status: DC
Start: 2015-02-25 — End: 2015-02-27
  Filled 2015-02-25: qty 1

## 2015-02-25 MED ORDER — PIPERACILLIN-TAZOBACTAM IN DEX 2-0.25 GM/50ML IV SOLN
2.2500 g | Freq: Three times a day (TID) | INTRAVENOUS | Status: DC
  Administered 2015-02-25 – 2015-02-27 (×6): 2.25 g via INTRAVENOUS
  Filled 2015-02-25 (×8): qty 50

## 2015-02-25 NOTE — Progress Notes (Signed)
[facility] PROGRESS NOTE  [name] [id number] DOB: March 01, 1970 DOA: 02/24/2015 PCP: Maggie Font, MD  Assessment/Plan: 45 y/o male with PMH of DM h/o coma, CVA with Neurologic Insult, Quadriparesis, Schizoaffective Disorder, Wheelchair bound, ESRD on HD (MWF), HTN, presented with constipation. Also found to have hypothermia on admission, but then developed fever   1. Hypothermia on admission. Now febrile. Unclear etiology. Pt denies respiratory symptoms. No nausea, vomiting or abdominal pains. elevated lactic acid on admission.  -we will start empiric IV atx, pend cultures. R/o sepsis  CXR-No infiltrate or pulmonary edema. There is soft tissue prominence in right upper paratracheal region. Mass or adenopathy cannot be Excluded. We will obtain CT scan   2. Anemia. Acute on chronic. Hg 7.4 on admission. No s/s of acute bleeding.  -we will repeat Hg today. We will cont monitor Tf prn with HD 3. ESRD on HD (MWF). Consulted nephrology for HD 4. DM. ha1c-6.4. Home regimen lantus 10U. Mealtime insulin 5 U. Cont lantus+ISS 5. Constipation. Patient denies any abdominal pains. abd exam is benign. Start bowel regimen, try suppository. May try small golytely if needed  6. DM h/o coma, CVA with Neurologic Insult, Quadriparesis, Schizoaffective Disorder, Wheelchair bound. Cont supportive care    Code Status: full Family Communication: d/w patient, RN, nephrology  (indicate person spoken with, relationship, and if by phone, the number) Disposition Plan: home pend clinical improvement    Consultants:  Nephrology   Procedures:  HD  Antibiotics:  Vanc/zosyn 1/19>>> (indicate start date, and stop date if known)  HPI/Subjective: Alert. Denies any SOB, no chest pains, denies abdominal pains   Objective: Filed Vitals:   02/25/15 0508 02/25/15 0759  BP: 144/79 146/82  Pulse: 90 82  Temp: 100.5 F (38.1 C) 98.8 F (37.1 C)  Resp: 20 18    Intake/Output Summary (Last 24  hours) at 02/25/15 1116 Last data filed at 02/25/15 1011  Gross per 24 hour  Intake    780 ml  Output      0 ml  Net    780 ml   Filed Weights   02/24/15 2157  Weight: 74 kg (163 lb 2.3 oz)    Exam:   General:  Alert. No distress   Cardiovascular: s1,s2 rrr  Respiratory: CTA BL   Abdomen: soft, mild distended, nt  Musculoskeletal: mild pedal edema    Data Reviewed: Basic Metabolic Panel:  Recent Labs Lab 02/24/15 1707  NA 139  K 4.6  CL 103  CO2 23  GLUCOSE 223*  BUN 58*  CREATININE 5.11*  CALCIUM 9.0   Liver Function Tests:  Recent Labs Lab 02/24/15 1707  AST 34  ALT 19  ALKPHOS 188*  BILITOT 0.8  PROT 7.5  ALBUMIN 4.0    Recent Labs Lab 02/24/15 1707  LIPASE 28   No results for input(s): AMMONIA in the last 168 hours. CBC:  Recent Labs Lab 02/24/15 1707  WBC 9.6  NEUTROABS 8.3*  HGB 7.4*  HCT 23.0*  MCV 97.5  PLT 145*   Cardiac Enzymes: No results for input(s): CKTOTAL, CKMB, CKMBINDEX, TROPONINI in the last 168 hours. BNP (last 3 results)  Recent Labs  02/24/15 2013  BNP 542.5*    ProBNP (last 3 results) No results for input(s): PROBNP in the last 8760 hours.  CBG:  Recent Labs Lab 02/24/15 2236 02/25/15 0754  GLUCAP 120* 76    Recent Results (from the past 240 hour(s))  MRSA PCR Screening     Status: None  Collection Time: 02/24/15  8:40 PM  Result Value Ref Range Status   MRSA by PCR NEGATIVE NEGATIVE Final    Comment:        The GeneXpert MRSA Assay (FDA approved for NASAL specimens only), is one component of a comprehensive MRSA colonization surveillance program. It is not intended to diagnose MRSA infection nor to guide or monitor treatment for MRSA infections.      Studies: Dg Chest 2 View  02/24/2015  CLINICAL DATA:  Constipation, weakness, quadriplegia EXAM: CHEST  2 VIEW COMPARISON:  11/24/2010 FINDINGS: Cardiac size is stable. No acute infiltrate or pulmonary edema. Mild basilar atelectasis.  There is soft tissue prominence right paratracheal region. Mass or adenopathy cannot be excluded. Further correlation with enhanced CT scan of the chest is recommended. IMPRESSION: No infiltrate or pulmonary edema. There is soft tissue prominence in right upper paratracheal region. Mass or adenopathy cannot be excluded. Further correlation with CT scan of the chest is recommended. Electronically Signed   By: Lahoma Crocker M.D.   On: 02/24/2015 18:21   Abd 1 View (kub)  02/24/2015  CLINICAL DATA:  Constipation starting last night, quadriparesis EXAM: ABDOMEN - 1 VIEW COMPARISON:  None. FINDINGS: Minimal gaseous distended small bowel loops lower abdomen without air-fluid levels. Abundant stool noted in right colon. Moderate stool and gas noted in descending colon and sigmoid colon. Atherosclerotic calcifications of femoral and iliac arteries. IMPRESSION: No evidence of small bowel obstruction. Colonic stool as described above. Electronically Signed   By: Lahoma Crocker M.D.   On: 02/24/2015 20:05    Scheduled Meds: . amLODipine  10 mg Oral Daily  . atropine  1 drop Left Eye BID  . cinacalcet  30 mg Oral Daily  . cloNIDine  0.1 mg Oral TID  . feeding supplement (NEPRO CARB STEADY)  237 mL Oral BID BM  . hydrALAZINE  100 mg Oral TID  . insulin aspart  0-9 Units Subcutaneous TID WC  . insulin glargine  10 Units Subcutaneous QHS  . multivitamin  1 tablet Oral Daily  . pantoprazole  40 mg Oral QHS  . polyethylene glycol  34 g Oral Daily  . prednisoLONE acetate  1 drop Left Eye BID  . risperiDONE  2 mg Oral BID  . sevelamer carbonate  800 mg Oral TID WC  . simvastatin  20 mg Oral QHS  . sodium chloride  3 mL Intravenous Q12H  . sodium chloride  3 mL Intravenous Q12H  . temazepam  15 mg Oral QHS   Continuous Infusions:   Principal Problem:   Constipation, acute Active Problems:   ESRD (end stage renal disease) on dialysis (HCC)   Normocytic anemia   Occult GI bleeding   Hypertension    Thrombocytopenia (H[facility])   Hypothermia   Elevated lactic acid level   Hyperthyroidism   Anemia due to GI blood loss   Schizophrenia ([place])    Time spent: >35 minutes     Kinnie Feil  [facility] Pager [phone]. If 7PM-7AM, please contact night-coverage at [email or web address], password [facility] 02/25/2015, 11:16 AM  LOS: 1 day

## 2015-02-25 NOTE — Progress Notes (Signed)
Patient has signed blood consent form however due to his quadriparesis patient attempted to sign and "X" on the paper.  Theodosia Paling 02/25/2015 2:24 PM

## 2015-02-25 NOTE — Progress Notes (Signed)
Hg is trending down. repeat Hg-6.8. We will Tf 2 units PRBC with HD. Cont close monitor, Tf prn. On admission: rectal exam, there was no frank blood or melena, but occult blood testing was positive -D/w patient, and consulted GI evaluation Reinhart Saulters N

## 2015-02-25 NOTE — Progress Notes (Signed)
Initial Nutrition Assessment  DOCUMENTATION CODES:   Not applicable  INTERVENTION:  Provide 30 ml Prostat po BID, each supplement provides 100 kcal and 15 grams of protein.   Provide nourishment snacks. (Ordered)  Encouraged adequate PO intake.   NUTRITION DIAGNOSIS:   Increased nutrient needs related to chronic illness as evidenced by estimated needs.  GOAL:   Patient will meet greater than or equal to 90% of their needs  MONITOR:   PO intake, Supplement acceptance, Weight trends, Labs, I & O's  REASON FOR ASSESSMENT:   Malnutrition Screening Tool    ASSESSMENT:   45 y/o male with PMH of DM h/o coma, CVA with Neurologic Insult, Quadriparesis, Schizoaffective Disorder, Wheelchair bound, ESRD on HD (MWF), HTN, presented with constipation. Also found to have hypothermia on admission, but then developed fever    Meal completion has been 100%. Pt reports having a good appetite currently and PTA with consumption of at least 3 meals a day. Weight has been stable. Noted pt with pressure ulcer. Pt currently has Nepro ordered and has been refusing them as he reports it causes abdominal discomfort as he is lactose intolerant. RD to order Prostat instead to aid in caloric and protein needs. Pt requested nourishment snacks. RD to order.   Nutrition-Focused physical exam deferred at this time due to quadriparesis.   Labs and medications reviewed.   Diet Order:  Diet renal/carb modified with fluid restriction Diet-HS Snack?: Nothing; Room service appropriate?: Yes; Fluid consistency:: Thin; Fluid restriction:: 1200 mL Fluid  Skin:  Wound (see comment) (Stage II pressure ulcer on sacrum)  Last BM:  1/18  Height:   Ht Readings from Last 1 Encounters:  01/10/11 6' (1.829 m)    Weight:   Wt Readings from Last 1 Encounters:  02/24/15 163 lb 2.3 oz (74 kg)    Ideal Body Weight:  72.9 kg (adjusted for quadriparesis)  BMI:  Body mass index is 22.12 kg/(m^2).  Estimated  Nutritional Needs:   Kcal:  2000-2250  Protein:  100-110 grams  Fluid:  1.2 L/day  EDUCATION NEEDS:   No education needs identified at this time  Corrin Parker, MS, RD, LDN Pager # [phone] After hours/ weekend pager # [phone]

## 2015-02-25 NOTE — Consult Note (Addendum)
[facility] Renal Consultation Note    Indication for Consultation:  Management of ESRD/hemodialysis; anemia, hypertension/volume and secondary hyperparathyroidism  HPI: [name] is a 45 y.o. 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 H missed his Wednesday HD treatment K yesterday was 4.6 - no labs yet today. He states he has been on HD for six years.  He was hypothermic upon presentation with temp of 94.7 with temp spike to 100.5.  WBC 9.6 CXR neg for infiltrate but some paratracheal soft tissue prominance. Abd films showed ^ stool.Marland Kitchen He denies N, V, SOB, cough. He is repeatedly requesting peanut butter and crackers.  Past Medical History  Diagnosis Date  . Diabetes mellitus   . Hyperthyroidism   . Quadriparesis (muscle weakness) ([place])   . Constipation   . Chronic kidney disease esrd  . Schizo-affective schizophrenia ([place])   . Hypertension   . Depression    Past Surgical History  Procedure Laterality Date  . Dialysis fistula creation    . Multiple extractions with alveoloplasty  01/17/2011    Procedure: MULTIPLE EXTRACION WITH ALVEOLOPLASTY;  Surgeon: Gae Bon;  Location: [facility] OR;  Service: Oral Surgery;  Laterality: Bilateral;  Extraction of number one, sixteen, seventeen, and thirty two.    Family History  Problem Relation Age of Onset  . Family history unknown: Yes   Social History:  reports that he has never smoked. He does not have any smokeless tobacco history on file. He reports that he does not drink alcohol or use illicit drugs. Allergies  Allergen Reactions  . Lactose Intolerance (Gi) Diarrhea  . Polymyxin B Swelling    "Can't take this eye drop," per outside records.   . Sulfamethoxazole  Rash    "Swelling of area, face, from drop that had sulfa," per outside records.    Prior to Admission medications   Medication Sig Start Date End Date Taking? Authorizing Provider  amLODipine (NORVASC) 10 MG tablet Take 10 mg by mouth daily.     Yes Historical Provider, MD  atropine 1 % ophthalmic solution Place 1 drop into the left eye 2 (two) times daily. 07/16/14  Yes Historical Provider, MD  ciclopirox (PENLAC) 8 % solution Apply 1 application topically at bedtime. Apply over nail and surrounding skin. Apply daily over previous coat. After seven (7) days, may remove with alcohol and continue cycle.   Yes Historical Provider, MD  cinacalcet (SENSIPAR) 30 MG tablet Take 30 mg by mouth daily.     Yes Historical Provider, MD  cloNIDine (CATAPRES) 0.1 MG tablet Take 0.1 mg by mouth 3 (three) times daily.   Yes Historical Provider, MD  hydrALAZINE (APRESOLINE) 100 MG tablet Take 100 mg by mouth 3 (three) times daily.     Yes Historical Provider, MD  insulin glargine (LANTUS) 100 UNIT/ML injection Inject 10 Units into the skin daily.    Yes Historical Provider, MD  insulin lispro (HUMALOG) 100 UNIT/ML injection Inject 5 Units into the skin 3 (three) times daily before meals. Inject additional units for blood sugar levels that are 301-350=6 units, 351-400=8 units.   Yes Historical Provider, MD  ketoconazole (NIZORAL) 2 % cream Apply 1 application topically 2 (two) times daily.   Yes Historical Provider, MD  LORazepam (ATIVAN) 1 MG tablet Take 1 mg by mouth every 8 (eight) hours as needed. For anxiety    Yes Historical Provider, MD  multivitamin (RENA-VIT) TABS tablet Take 1 tablet by mouth daily.     Yes Historical Provider, MD  nitroGLYCERIN (NITROSTAT) 0.4 MG SL tablet Place 0.4 mg under the tongue every 5 (five) minutes as needed. For chest pain    Yes Historical Provider, MD  pantoprazole (PROTONIX) 40 MG tablet Take 40 mg by mouth daily.     Yes Historical Provider, MD  prednisoLONE acetate (PRED  FORTE) 1 % ophthalmic suspension Place 1 drop into the left eye 2 (two) times daily. 10/20/14  Yes Historical Provider, MD  risperiDONE (RISPERDAL) 2 MG tablet Take 2 mg by mouth 2 (two) times daily.     Yes Historical Provider, MD  sevelamer (RENVELA) 800 MG tablet Take 800 mg by mouth 3 (three) times daily.     Yes Historical Provider, MD  simvastatin (ZOCOR) 20 MG tablet Take 20 mg by mouth at bedtime.     Yes Historical Provider, MD  temazepam (RESTORIL) 15 MG capsule Take 15 mg by mouth at bedtime.     Yes Historical Provider, MD  labetalol (NORMODYNE) 200 MG tablet Take 200 mg by mouth 3 (three) times daily.      Historical Provider, MD  losartan (COZAAR) 100 MG tablet Take 100 mg by mouth daily.      Historical Provider, MD   Current Facility-Administered Medications  Medication Dose Route Frequency Provider Last Rate Last Dose  . 0.9 %  sodium chloride infusion  250 mL Intravenous PRN Ilene Qua Opyd, MD      . 0.9 %  sodium chloride infusion   Intravenous Once Kinnie Feil, MD      . acetaminophen (TYLENOL) tablet 650 mg  650 mg Oral Q6H PRN Vianne Bulls, MD       Or  . acetaminophen (TYLENOL) suppository 650 mg  650 mg Rectal Q6H PRN Vianne Bulls, MD      . Derrill Memo ON 02/26/2015] amLODipine (NORVASC) tablet 10 mg  10 mg Oral QHS Mauricia Area, MD      . atropine 1 % ophthalmic solution 1 drop  1 drop Left Eye BID Vianne Bulls, MD   1 drop at 02/25/15 0958  . bisacodyl (DULCOLAX) suppository 10 mg  10 mg Rectal Once Kinnie Feil, MD      . cinacalcet (SENSIPAR) tablet 30 mg  30 mg Oral Daily Vianne Bulls, MD   30 mg at 02/25/15 1006  . cloNIDine (CATAPRES) tablet 0.1 mg  0.1 mg Oral TID Vianne Bulls, MD   0.1 mg at 02/25/15 0959  . feeding supplement (NEPRO CARB STEADY) liquid 237 mL  237 mL Oral BID BM Edwin Dada, MD   237 mL at 02/25/15 1021  . hydrALAZINE (APRESOLINE) tablet 100 mg  100 mg Oral TID Vianne Bulls, MD   100 mg at 02/25/15 0959  .  HYDROcodone-acetaminophen (NORCO/VICODIN) 5-325 MG per tablet 1-2 tablet  1-2 tablet Oral Q4H PRN Ilene Qua Opyd, MD      . HYDROmorphone (DILAUDID) injection 0.5 mg  0.5 mg Intravenous Q3H PRN Ilene Qua Opyd, MD      . insulin aspart (novoLOG) injection 0-9 Units  0-9 Units Subcutaneous TID WC Vianne Bulls, MD   0 Units at 02/25/15 0827  . insulin glargine (LANTUS) injection 10 Units  10 Units Subcutaneous QHS  Vianne Bulls, MD   10 Units at 02/24/15 2320  . LORazepam (ATIVAN) tablet 1 mg  1 mg Oral Q6H PRN Vianne Bulls, MD      . multivitamin (RENA-VIT) tablet 1 tablet  1 tablet Oral Daily Vianne Bulls, MD   1 tablet at 02/25/15 0959  . nitroGLYCERIN (NITROSTAT) SL tablet 0.4 mg  0.4 mg Sublingual Q5 min PRN Ilene Qua Opyd, MD      . ondansetron (ZOFRAN) tablet 4 mg  4 mg Oral Q6H PRN Vianne Bulls, MD       Or  . ondansetron (ZOFRAN) injection 4 mg  4 mg Intravenous Q6H PRN Ilene Qua Opyd, MD      . pantoprazole (PROTONIX) EC tablet 40 mg  40 mg Oral QHS Lauren D Bajbus, RPH      . piperacillin-tazobactam (ZOSYN) IVPB 2.25 g  2.25 g Intravenous 3 times per day Lauren D Bajbus, RPH      . polyethylene glycol (MIRALAX / GLYCOLAX) packet 34 g  34 g Oral Daily Vianne Bulls, MD   34 g at 02/24/15 2320  . prednisoLONE acetate (PRED FORTE) 1 % ophthalmic suspension 1 drop  1 drop Left Eye BID Vianne Bulls, MD   1 drop at 02/25/15 0958  . risperiDONE (RISPERDAL) tablet 2 mg  2 mg Oral BID Vianne Bulls, MD   2 mg at 02/25/15 1044  . senna-docusate (Senokot-S) tablet 1 tablet  1 tablet Oral BID Kinnie Feil, MD      . sevelamer carbonate (RENVELA) tablet 800 mg  800 mg Oral TID WC Vianne Bulls, MD   800 mg at 02/25/15 0959  . simvastatin (ZOCOR) tablet 20 mg  20 mg Oral QHS Vianne Bulls, MD   20 mg at 02/24/15 2319  . sodium chloride 0.9 % injection 3 mL  3 mL Intravenous Q12H Ilene Qua Opyd, MD   3 mL at 02/25/15 1012  . sodium chloride 0.9 % injection 3 mL  3 mL Intravenous Q12H  Ilene Qua Opyd, MD   3 mL at 02/25/15 1010  . sodium chloride 0.9 % injection 3 mL  3 mL Intravenous PRN Ilene Qua Opyd, MD      . temazepam (RESTORIL) capsule 15 mg  15 mg Oral QHS Vianne Bulls, MD   15 mg at 02/24/15 2318  . vancomycin (VANCOCIN) 1,750 mg in sodium chloride 0.9 % 500 mL IVPB  1,750 mg Intravenous Once Lauren D Bajbus, RPH       Labs: Basic Metabolic Panel:  Recent Labs Lab 02/24/15 1707  NA 139  K 4.6  CL 103  CO2 23  GLUCOSE 223*  BUN 58*  CREATININE 5.11*  CALCIUM 9.0   Liver Function Tests:  Recent Labs Lab 02/24/15 1707  AST 34  ALT 19  ALKPHOS 188*  BILITOT 0.8  PROT 7.5  ALBUMIN 4.0    Recent Labs Lab 02/24/15 1707  LIPASE 28   CBC:  Recent Labs Lab 02/24/15 1707 02/25/15 1113  WBC 9.6 7.7  NEUTROABS 8.3*  --   HGB 7.4* 6.8*  HCT 23.0* 20.7*  MCV 97.5 96.7  PLT 145* 124*   CBG:  Recent Labs Lab 02/24/15 2236 02/25/15 0754  GLUCAP 120* 76   Studies/Results: Dg Chest 2 View  02/24/2015  CLINICAL DATA:  Constipation, weakness, quadriplegia EXAM: CHEST  2 VIEW COMPARISON:  11/24/2010 FINDINGS: Cardiac size is stable. No acute infiltrate or pulmonary edema. Mild  basilar atelectasis. There is soft tissue prominence right paratracheal region. Mass or adenopathy cannot be excluded. Further correlation with enhanced CT scan of the chest is recommended. IMPRESSION: No infiltrate or pulmonary edema. There is soft tissue prominence in right upper paratracheal region. Mass or adenopathy cannot be excluded. Further correlation with CT scan of the chest is recommended. Electronically Signed   By: Lahoma Crocker M.D.   On: 02/24/2015 18:21   Abd 1 View (kub)  02/24/2015  CLINICAL DATA:  Constipation starting last night, quadriparesis EXAM: ABDOMEN - 1 VIEW COMPARISON:  None. FINDINGS: Minimal gaseous distended small bowel loops lower abdomen without air-fluid levels. Abundant stool noted in right colon. Moderate stool and gas noted in descending  colon and sigmoid colon. Atherosclerotic calcifications of femoral and iliac arteries. IMPRESSION: No evidence of small bowel obstruction. Colonic stool as described above. Electronically Signed   By: Lahoma Crocker M.D.   On: 02/24/2015 20:05    ROS: As per HPI otherwise negative.  Physical Exam: HEENT eyes up to R , clouded vitreous, lids closed, evidence of recent drainage,  Mouth with moist MM. Teeth ok Neck PCL not real large, Has scar on R side of upper Temporal area Lungs  Without R,R, or W, good BS CV reg Gr 2/6 m, at apex, LV lift, PMI 13 cm lat to MSL Abdm liver down 5 cm Skin many scars,  3cm superficial sacral ulcer, foul smelling, no drainage Extrem RUA avf with some aneurysms,  Good B&T Extremely emotional Filed Vitals:   02/24/15 2130 02/24/15 2157 02/25/15 0508 02/25/15 0759  BP:  155/88 144/79 146/82  Pulse:  98 90 82  Temp: 98.8 F (37.1 C) 98.3 F (36.8 C) 100.5 F (38.1 C) 98.8 F (37.1 C)  TempSrc: Rectal   Oral  Resp:  16 20 18   Weight:  74 kg (163 lb 2.3 oz)    SpO2:  100% 97% 99%     General:see above Head: Neck:  Lungs:  Heart:  Abdomen: . Lower extremities:  Neuro:  Skin: Dialysis Access: right upper AVF + bruit/thrill - no evidence of infection  Dialysis Orders: Center: [facility] MWF awaiting outpt HD orders Assessment/Plan: 1.  Fever- for cultures/empiric Vanc/Zosyn - no obvious source, for Chest CT, May need I&O cath, ? viral 2. Consitpation - pt doesn't want anything stronger than stool softener-per primary 3.  ESRD -  MWF - no acute need for HD today - plan first round Friday unless K high today- labs pending - no heparin due to + stool 4.  Hypertension/volume  - on hydralazine 100 tid, clonidine 0.1 tid and amlodipine 10 Needs lower dry to lower meds 5.  Anemia  - Hgb 7.4 down to 6.8 - repeat Friday - plan  tranfusion on HD Friday - need to outpt records to see if Heme + chronic  May be EPO defic or Fe 6.  Metabolic bone disease -   sensipar/renvela 7.  Nutrition - renal carb mod/vit 8. Schizo 9. Quadriparesis ?? Related to hypoglycemic coma vs other  Myriam Jacobson, PA-C [place] [phone] 02/25/2015, 11:54 AM I have seen and examined this patient and agree with the plan of care seen ,examined,eval, and discussed. .  DETERDING,JAMES L  02/25/2015, 12:16 PM  ADDENDUM:   HD orders: 4hr 200 EDW 155# = 70.5 400/800 2 K 2.5 Ca Aranesp 25 per RN, hectorol 6, course of ferrlicit - I do not see a heparin dose  Information from outpatient HD  Unit [facility] received  RN not aware of prior GIB issue. Has had UTIs, apsiration PNA. Gets most of inpt care at [facility].  Hgb trending down recently.  Was in the 11s in November previously stable on very low dose Epo, now  Aranesp 25 .  Hgb 8.8 12/7 8.3 12/21 7.7  more recently tsat  has dropped from 67% to 11 %  1/4  With ferritin 480  and just started on a course of IV fe. Average BPs throughout HD treatments around ~150 stable.  Hgb A1C 6.4 `1/4; Ca/P ok Alb upper 3s K ok iPTH 995 1/4 up from prior iPTH 180 in November which was probably a false low when you look at overal trends  Plan continue hectorol 6, recheck Fe studies, continue repleting IV Fe, continue ESA - oupt dose was 25 ^ to 60. Titrate EDW/BP  M. Reinaldo Meeker, PA-C

## 2015-02-25 NOTE — Consult Note (Signed)
Reason for Consult: Heme positive stool and anemia Referring Physician: [facility]  Bruce Donath HPI: This is a 45 year old male with quadriparesis, ESRD, DM, hyperthyroidism, and schizophrenia admitted for complaints of constipation.  Prior to admission he was reported to be on the toilet for 4 hours.  With the constipation there is a dull rectal pain, but there is no associated abdominal pain, nausea, or vomiting.  On admission he was hypothermic, hypertensive, and tachycardic.  His HGB was at 7.4 g/dL with a baseline of 8-9 g/dL.  A rectal examination was performed and it was heme positive, but no evidence of fresh blood or melena.  GI is consulted as there is a drop in his HGB and the heme positive stool  Past Medical History  Diagnosis Date  . Diabetes mellitus   . Hyperthyroidism   . Quadriparesis (muscle weakness) ([place])   . Constipation   . Chronic kidney disease esrd  . Schizo-affective schizophrenia ([place])   . Hypertension   . Depression     Past Surgical History  Procedure Laterality Date  . Dialysis fistula creation    . Multiple extractions with alveoloplasty  01/17/2011    Procedure: MULTIPLE EXTRACION WITH ALVEOLOPLASTY;  Surgeon: Gae Bon;  Location: [facility] OR;  Service: Oral Surgery;  Laterality: Bilateral;  Extraction of number one, sixteen, seventeen, and thirty two.     Family History  Problem Relation Age of Onset  . Family history unknown: Yes    Social History:  reports that he has never smoked. He does not have any smokeless tobacco history on file. He reports that he does not drink alcohol or use illicit drugs.  Allergies:  Allergies  Allergen Reactions  . Lactose Intolerance (Gi) Diarrhea  . Polymyxin B Swelling    "Can't take this eye drop," per outside records.   . Sulfamethoxazole Rash    "Swelling of area, face, from drop that had sulfa," per outside records.     Medications:  Scheduled: . sodium chloride   Intravenous Once  .  [START ON 02/26/2015] amLODipine  10 mg Oral QHS  . atropine  1 drop Left Eye BID  . bisacodyl  10 mg Rectal Once  . cinacalcet  30 mg Oral Daily  . cloNIDine  0.1 mg Oral TID  . feeding supplement (PRO-STAT SUGAR FREE 64)  30 mL Oral BID  . hydrALAZINE  100 mg Oral TID  . insulin aspart  0-9 Units Subcutaneous TID WC  . insulin glargine  10 Units Subcutaneous QHS  . multivitamin  1 tablet Oral Daily  . pantoprazole  40 mg Oral QHS  . piperacillin-tazobactam (ZOSYN)  IV  2.25 g Intravenous 3 times per day  . polyethylene glycol  34 g Oral Daily  . prednisoLONE acetate  1 drop Left Eye BID  . risperiDONE  2 mg Oral BID  . senna-docusate  1 tablet Oral BID  . sevelamer carbonate  800 mg Oral TID WC  . simvastatin  20 mg Oral QHS  . sodium chloride  3 mL Intravenous Q12H  . sodium chloride  3 mL Intravenous Q12H  . temazepam  15 mg Oral QHS   Continuous:   Results for orders placed or performed during the hospital encounter of 02/24/15 (from the past 24 hour(s))  POC occult blood, ED Provider will collect     Status: Abnormal   Collection Time: 02/24/15  6:18 PM  Result Value Ref Range   Fecal Occult Bld POSITIVE (A)  NEGATIVE  Urinalysis, Routine w reflex microscopic     Status: Abnormal   Collection Time: 02/24/15  6:24 PM  Result Value Ref Range   Color, Urine AMBER (A) YELLOW   APPearance TURBID (A) CLEAR   Specific Gravity, Urine 1.025 1.005 - 1.030   pH 7.5 5.0 - 8.0   Glucose, UA NEGATIVE NEGATIVE mg/dL   Hgb urine dipstick SMALL (A) NEGATIVE   Bilirubin Urine NEGATIVE NEGATIVE   Ketones, ur NEGATIVE NEGATIVE mg/dL   Protein, ur >300 (A) NEGATIVE mg/dL   Nitrite NEGATIVE NEGATIVE   Leukocytes, UA LARGE (A) NEGATIVE  Urine microscopic-add on     Status: Abnormal   Collection Time: 02/24/15  6:24 PM  Result Value Ref Range   Squamous Epithelial / LPF 0-5 (A) NONE SEEN   WBC, UA TOO NUMEROUS TO COUNT 0 - 5 WBC/hpf   RBC / HPF 0-5 0 - 5 RBC/hpf   Bacteria, UA MANY (A)  NONE SEEN   Urine-Other LESS THAN 10 mL OF URINE SUBMITTED   Culture, blood (routine x 2)     Status: None (Preliminary result)   Collection Time: 02/24/15  8:10 PM  Result Value Ref Range   Specimen Description BLOOD LEFT HAND    Special Requests BOTTLES DRAWN AEROBIC AND ANAEROBIC 5 ML    Culture      NO GROWTH < 24 HOURS Performed at [facility]    Report Status PENDING   Procalcitonin - Baseline     Status: None   Collection Time: 02/24/15  8:13 PM  Result Value Ref Range   Procalcitonin 0.62 ng/mL  Protime-INR     Status: None   Collection Time: 02/24/15  8:13 PM  Result Value Ref Range   Prothrombin Time 15.0 11.6 - 15.2 seconds   INR 1.16 0.00 - 1.49  APTT     Status: None   Collection Time: 02/24/15  8:13 PM  Result Value Ref Range   aPTT 36 24 - 37 seconds  TSH     Status: Abnormal   Collection Time: 02/24/15  8:13 PM  Result Value Ref Range   TSH 4.514 (H) 0.350 - 4.500 uIU/mL  Brain natriuretic peptide     Status: Abnormal   Collection Time: 02/24/15  8:13 PM  Result Value Ref Range   B Natriuretic Peptide 542.5 (H) 0.0 - 100.0 pg/mL  Prepare RBC     Status: None   Collection Time: 02/24/15  8:13 PM  Result Value Ref Range   Order Confirmation ORDER PROCESSED BY BLOOD BANK   Hemoglobin A1c     Status: Abnormal   Collection Time: 02/24/15  8:13 PM  Result Value Ref Range   Hgb A1c MFr Bld 6.4 (H) 4.8 - 5.6 %   Mean Plasma Glucose 137 mg/dL  Lactic acid, plasma     Status: None   Collection Time: 02/24/15  8:13 PM  Result Value Ref Range   Lactic Acid, Venous 1.1 0.5 - 2.0 mmol/L  T4, free     Status: None   Collection Time: 02/24/15  8:14 PM  Result Value Ref Range   Free T4 0.78 0.61 - 1.12 ng/dL  Type and screen [place]     Status: None (Preliminary result)   Collection Time: 02/24/15  8:20 PM  Result Value Ref Range   ABO/RH(D) B NEG    Antibody Screen NEG    Sample Expiration 02/27/2015    Unit Number [id number]  Blood Component Type RED CELLS,LR    Unit division 00    Status of Unit ALLOCATED    Transfusion Status OK TO TRANSFUSE    Crossmatch Result Compatible    Unit Number [id number]    Blood Component Type RED CELLS,LR    Unit division 00    Status of Unit ALLOCATED    Transfusion Status OK TO TRANSFUSE    Crossmatch Result Compatible   Culture, blood (routine x 2)     Status: None (Preliminary result)   Collection Time: 02/24/15  8:20 PM  Result Value Ref Range   Specimen Description BLOOD LEFT ANTECUBITAL    Special Requests IN PEDIATRIC BOTTLE 3 ML    Culture      NO GROWTH < 24 HOURS Performed at [facility]    Report Status PENDING   MRSA PCR Screening     Status: None   Collection Time: 02/24/15  8:40 PM  Result Value Ref Range   MRSA by PCR NEGATIVE NEGATIVE  ABO/Rh     Status: None   Collection Time: 02/24/15  9:00 PM  Result Value Ref Range   ABO/RH(D) B NEG   Glucose, capillary     Status: Abnormal   Collection Time: 02/24/15 10:36 PM  Result Value Ref Range   Glucose-Capillary 120 (H) 65 - 99 mg/dL  Glucose, capillary     Status: None   Collection Time: 02/25/15  7:54 AM  Result Value Ref Range   Glucose-Capillary 76 65 - 99 mg/dL  CBC     Status: Abnormal   Collection Time: 02/25/15 11:13 AM  Result Value Ref Range   WBC 7.7 4.0 - 10.5 K/uL   RBC 2.14 (L) 4.22 - 5.81 MIL/uL   Hemoglobin 6.8 (LL) 13.0 - 17.0 g/dL   HCT 20.7 (L) 39.0 - 52.0 %   MCV 96.7 78.0 - 100.0 fL   MCH 31.8 26.0 - 34.0 pg   MCHC 32.9 30.0 - 36.0 g/dL   RDW 15.6 (H) 11.5 - 15.5 %   Platelets 124 (L) 150 - 400 K/uL  Prepare RBC     Status: None   Collection Time: 02/25/15 11:56 AM  Result Value Ref Range   Order Confirmation ORDER PROCESSED BY BLOOD BANK   Glucose, capillary     Status: Abnormal   Collection Time: 02/25/15 12:13 PM  Result Value Ref Range   Glucose-Capillary 109 (H) 65 - 99 mg/dL  Type and screen [place]     Status: None  (Preliminary result)   Collection Time: 02/25/15  2:56 PM  Result Value Ref Range   ABO/RH(D) B NEG    Antibody Screen NEG    Sample Expiration 02/28/2015    Unit Number [id number]    Blood Component Type RED CELLS,LR    Unit division 00    Status of Unit ALLOCATED    Transfusion Status OK TO TRANSFUSE    Crossmatch Result Compatible    Unit Number [id number]    Blood Component Type RED CELLS,LR    Unit division 00    Status of Unit ALLOCATED    Transfusion Status OK TO TRANSFUSE    Crossmatch Result Compatible   Glucose, capillary     Status: Abnormal   Collection Time: 02/25/15  4:01 PM  Result Value Ref Range   Glucose-Capillary 180 (H) 65 - 99 mg/dL     Dg Chest 2 View  02/24/2015  CLINICAL DATA:  Constipation, weakness, quadriplegia EXAM: CHEST  2 VIEW COMPARISON:  11/24/2010 FINDINGS: Cardiac size is stable. No acute infiltrate or pulmonary edema. Mild basilar atelectasis. There is soft tissue prominence right paratracheal region. Mass or adenopathy cannot be excluded. Further correlation with enhanced CT scan of the chest is recommended. IMPRESSION: No infiltrate or pulmonary edema. There is soft tissue prominence in right upper paratracheal region. Mass or adenopathy cannot be excluded. Further correlation with CT scan of the chest is recommended. Electronically Signed   By: Lahoma Crocker M.D.   On: 02/24/2015 18:21   Abd 1 View (kub)  02/24/2015  CLINICAL DATA:  Constipation starting last night, quadriparesis EXAM: ABDOMEN - 1 VIEW COMPARISON:  None. FINDINGS: Minimal gaseous distended small bowel loops lower abdomen without air-fluid levels. Abundant stool noted in right colon. Moderate stool and gas noted in descending colon and sigmoid colon. Atherosclerotic calcifications of femoral and iliac arteries. IMPRESSION: No evidence of small bowel obstruction. Colonic stool as described above. Electronically Signed   By: Lahoma Crocker M.D.   On: 02/24/2015 20:05   Ct Chest Wo  Contrast  02/25/2015  CLINICAL DATA:  Dialysis patient. Nonverbal. Soft tissue prominence in the RIGHT upper paratracheal region. EXAM: CT CHEST WITHOUT CONTRAST TECHNIQUE: Multidetector CT imaging of the chest was performed following the standard protocol without IV contrast. COMPARISON:  Chest radiograph 02/24/2015 FINDINGS: Mediastinum/Nodes: No axillary supraclavicular adenopathy. Normal thyroid gland. No mediastinal hilar adenopathy. No pericardial fluid. Coronary artery calcifications are present. Knee RIGHT paratracheal density on comparison radiograph corresponds to an ectatic superior vena cava. Lungs/Pleura: There bilateral pleural effusions. There is mild airspace disease in the RIGHT lower lobe (image 42, series 3) airways are normal. Upper abdomen: Limited view of the liver, kidneys, pancreas are unremarkable. Normal adrenal glands. Mild airspace disease in the LEFT lower lobe to a lesser degree. Musculoskeletal: Diffuse sclerosis of the bones.  No acute findings. IMPRESSION: 1. Ectatic superior vena cava corresponds to the plain film abnormality, a benign finding. 2. Bilateral pleural effusions. 3. Mild airspace disease in the LEFT and RIGHT lower lobe consistent with atelectasis, pneumonia, or aspiration. Electronically Signed   By: Suzy Bouchard M.D.   On: 02/25/2015 14:17    ROS:  As stated above in the HPI otherwise negative.  Blood pressure 125/69, pulse 83, temperature 98 F (36.7 C), temperature source Oral, resp. rate 17, weight 74 kg (163 lb 2.3 oz), SpO2 100 %.    PE: Gen: Responds to questions HEENT:  [place]/AT, EOMI Neck: Supple, no LAD Lungs: CTA Bilaterally CV: RRR without M/G/R ABM: Soft, NTND, +BS Ext: Contracted.  Assessment/Plan: 1) Heme positive stool. 2) Anemia. 3) Constipation. 4) Proctalgia.   The patient refused a rectal examination and he started to have some agitation.  Per his current report, he does not have any problems with constipation.  There is a  drop in his HGB, but it is mild from his baseline.  The heme positivity may be from a hemorrhoidal source such as an anal fissure that can occur with constipation.  I do not feel that it is reflective of a significant GI bleed.  Further evaluation with EGD/Colonoscopy would be extremely difficult with his quadripelgia.    Plan: 1) Bowel regimen with laxatives. 2) Follow HGB and transfuse as necessary. 3) No endoscopic work up at this time. 4) Signing off.  Alletta Mattos D 02/25/2015, 5:43 PM

## 2015-02-25 NOTE — Progress Notes (Signed)
CRITICAL VALUE ALERT  Critical value received:  Hemoglobin 6.8   Date of notification:  02/25/15  Time of notification:  [id number]  Critical value read back:Yes.    Nurse who received alert:  Retta Mac  MD notified (1st page): Dr. Daleen Bo  Time of first page:  52  MD notified (2nd page):  Time of second page:  Responding MD:  Dr. Daleen Bo  Time MD responded:  [phone]

## 2015-02-25 NOTE — Progress Notes (Signed)
ANTIBIOTIC CONSULT NOTE - INITIAL  Pharmacy Consult for vancomycin and Zosyn Indication: rule out sepsis  Allergies  Allergen Reactions  . Lactose Intolerance (Gi) Diarrhea  . Polymyxin B Swelling    "Can't take this eye drop," per outside records.   . Sulfamethoxazole Rash    "Swelling of area, face, from drop that had sulfa," per outside records.     Patient Measurements: Weight: 163 lb 2.3 oz (74 kg)   Vital Signs: Temp: 98.8 F (37.1 C) (01/19 0759) Temp Source: Oral (01/19 0759) BP: 146/82 mmHg (01/19 0759) Pulse Rate: 82 (01/19 0759) Intake/Output from previous day: 01/18 0701 - 01/19 0700 In: 540 [P.O.:540] Out: -  Intake/Output from this shift: Total I/O In: 240 [P.O.:240] Out: -   Labs:  Recent Labs  02/24/15 1707  WBC 9.6  HGB 7.4*  PLT 145*  CREATININE 5.11*   CrCl cannot be calculated (Unknown ideal weight.). No results for input(s): VANCOTROUGH, VANCOPEAK, VANCORANDOM, GENTTROUGH, GENTPEAK, GENTRANDOM, TOBRATROUGH, TOBRAPEAK, TOBRARND, AMIKACINPEAK, AMIKACINTROU, AMIKACIN in the last 72 hours.   Microbiology: Recent Results (from the past 720 hour(s))  MRSA PCR Screening     Status: None   Collection Time: 02/24/15  8:40 PM  Result Value Ref Range Status   MRSA by PCR NEGATIVE NEGATIVE Final    Comment:        The GeneXpert MRSA Assay (FDA approved for NASAL specimens only), is one component of a comprehensive MRSA colonization surveillance program. It is not intended to diagnose MRSA infection nor to guide or monitor treatment for MRSA infections.    Assessment: 88 YOM here from group home with constipation x4 days. Was hypothermic on admit, now febrile. ESRD patient on HD MWF, so concern for sepsis. To start vancomycin and Zosyn. He missed HD on Wednesday 1/18.  WBC 9.6, tmax/24h 100.5. LA 2.49>1.1, PCT 0.62.  1/18 MRSA PCR: neg 1/18 BCx: ngtd  Goal of Therapy:  pre-HD vancomycin level 15-57mcg/mL  Plan:  -Zosyn 2.25g IV  q8h -vancomycin 1750mg  IV x1 -will follow HD schedule/tolerance and enter future doses of vancomycin  -follow c/s, clinical progression, level PRN  Dona Walby D. Tiant [place], PharmD, BCPS Clinical Pharmacist Pager: [phone] 02/25/2015 11:38 AM

## 2015-02-26 LAB — BASIC METABOLIC PANEL
Anion gap: 12 (ref 5–15)
BUN: 76 mg/dL — ABNORMAL HIGH (ref 6–20)
CO2: 22 mmol/L (ref 22–32)
Calcium: 8.4 mg/dL — ABNORMAL LOW (ref 8.9–10.3)
Chloride: 104 mmol/L (ref 101–111)
Creatinine, Ser: 6.93 mg/dL — ABNORMAL HIGH (ref 0.61–1.24)
GFR calc Af Amer: 10 mL/min — ABNORMAL LOW (ref >60)
GFR calc non Af Amer: 9 mL/min — ABNORMAL LOW (ref >60)
Glucose, Bld: 150 mg/dL — ABNORMAL HIGH (ref 65–99)
Potassium: 4.4 mmol/L (ref 3.5–5.1)
Sodium: 138 mmol/L (ref 135–145)

## 2015-02-26 LAB — CBC
HCT: 21.9 % — ABNORMAL LOW (ref 39.0–52.0)
HCT: 28.3 % — ABNORMAL LOW (ref 39.0–52.0)
Hemoglobin: 7 g/dL — ABNORMAL LOW (ref 13.0–17.0)
Hemoglobin: 9.7 g/dL — ABNORMAL LOW (ref 13.0–17.0)
MCH: 31.3 pg (ref 26.0–34.0)
MCH: 31.8 pg (ref 26.0–34.0)
MCHC: 32 g/dL (ref 30.0–36.0)
MCHC: 34.3 g/dL (ref 30.0–36.0)
MCV: 97.8 fL (ref 78.0–100.0)
MCV: 92.8 fL (ref 78.0–100.0)
Platelets: 120 10*3/uL — ABNORMAL LOW (ref 150–400)
Platelets: 128 10*3/uL — ABNORMAL LOW (ref 150–400)
RBC: 2.24 MIL/uL — ABNORMAL LOW (ref 4.22–5.81)
RBC: 3.05 MIL/uL — ABNORMAL LOW (ref 4.22–5.81)
RDW: 15.8 % — ABNORMAL HIGH (ref 11.5–15.5)
RDW: 16.5 % — ABNORMAL HIGH (ref 11.5–15.5)
WBC: 5 10*3/uL (ref 4.0–10.5)
WBC: 5.2 10*3/uL (ref 4.0–10.5)

## 2015-02-26 LAB — IRON AND TIBC
Iron: 77 ug/dL (ref 45–182)
Saturation Ratios: 36 % (ref 17.9–39.5)
TIBC: 213 ug/dL — ABNORMAL LOW (ref 250–450)
UIBC: 136 ug/dL

## 2015-02-26 LAB — FERRITIN: Ferritin: 799 ng/mL — ABNORMAL HIGH (ref 24–336)

## 2015-02-26 LAB — GLUCOSE, CAPILLARY
Glucose-Capillary: 99 mg/dL (ref 65–99)
Glucose-Capillary: 176 mg/dL — ABNORMAL HIGH (ref 65–99)
Glucose-Capillary: 228 mg/dL — ABNORMAL HIGH (ref 65–99)

## 2015-02-26 LAB — PROCALCITONIN: Procalcitonin: 1.7 ng/mL

## 2015-02-26 MED ORDER — LIDOCAINE-PRILOCAINE 2.5-2.5 % EX CREA
1.0000 "application " | TOPICAL_CREAM | CUTANEOUS | Status: DC | PRN
  Filled 2015-02-26: qty 5

## 2015-02-26 MED ORDER — SODIUM CHLORIDE 0.9 % IV SOLN: 100.0000 mL | INTRAVENOUS | Status: DC | PRN

## 2015-02-26 MED ORDER — HYDROCODONE-ACETAMINOPHEN 5-325 MG PO TABS
1.0000 | ORAL_TABLET | ORAL | Status: DC | PRN
  Administered 2015-02-27: 1 via ORAL
  Filled 2015-02-26: qty 1

## 2015-02-26 MED ORDER — LABETALOL HCL 100 MG PO TABS
100.0000 mg | ORAL_TABLET | Freq: Three times a day (TID) | ORAL | Status: DC
  Administered 2015-02-26 (×2): 100 mg via ORAL
  Filled 2015-02-26 (×2): qty 1

## 2015-02-26 MED ORDER — DARBEPOETIN ALFA 200 MCG/0.4ML IJ SOSY
PREFILLED_SYRINGE | INTRAMUSCULAR | Status: AC
  Filled 2015-02-26: qty 0.4

## 2015-02-26 MED ORDER — CLONIDINE HCL 0.1 MG PO TABS
0.1000 mg | ORAL_TABLET | Freq: Three times a day (TID) | ORAL | Status: DC
  Administered 2015-02-26 (×2): 0.1 mg via ORAL
  Filled 2015-02-26 (×2): qty 1

## 2015-02-26 MED ORDER — PEG 3350-KCL-NA BICARB-NACL 420 G PO SOLR
800.0000 mL | Freq: Once | ORAL | Status: AC
  Administered 2015-02-26: 800 mL via ORAL
  Filled 2015-02-26: qty 4000

## 2015-02-26 MED ORDER — PENTAFLUOROPROP-TETRAFLUOROETH EX AERO: 1.0000 "application " | INHALATION_SPRAY | CUTANEOUS | Status: DC | PRN

## 2015-02-26 MED ORDER — DARBEPOETIN ALFA 200 MCG/0.4ML IJ SOSY
200.0000 ug | PREFILLED_SYRINGE | INTRAMUSCULAR | Status: DC
  Administered 2015-02-26: 200 ug via INTRAVENOUS

## 2015-02-26 MED ORDER — VANCOMYCIN HCL IN DEXTROSE 750-5 MG/150ML-% IV SOLN
750.0000 mg | INTRAVENOUS | Status: DC
  Administered 2015-02-26: 750 mg via INTRAVENOUS
  Filled 2015-02-26: qty 150

## 2015-02-26 MED ORDER — HEPARIN SODIUM (PORCINE) 1000 UNIT/ML DIALYSIS: 1000.0000 [IU] | INTRAMUSCULAR | Status: DC | PRN

## 2015-02-26 MED ORDER — VANCOMYCIN HCL IN DEXTROSE 750-5 MG/150ML-% IV SOLN
INTRAVENOUS | Status: AC
  Administered 2015-02-26: 16:00:00
  Filled 2015-02-26: qty 150

## 2015-02-26 MED ORDER — ALTEPLASE 2 MG IJ SOLR
2.0000 mg | Freq: Once | INTRAMUSCULAR | Status: DC | PRN
  Filled 2015-02-26: qty 2

## 2015-02-26 MED ORDER — LIDOCAINE HCL (PF) 1 % IJ SOLN: 5.0000 mL | INTRAMUSCULAR | Status: DC | PRN

## 2015-02-26 NOTE — Progress Notes (Signed)
[facility] PROGRESS NOTE  [name] [id number] DOB: 1971/02/01 DOA: 02/24/2015 PCP: Maggie Font, MD  Assessment/Plan: 45 y/o male with PMH of DM h/o coma, CVA with Neurologic Insult, Quadriparesis, Schizoaffective Disorder, Wheelchair bound, ESRD on HD (MWF), HTN, presented with constipation. Pt noted to have anemia with Hg 7.4 on admission. Also found to have hypothermia on admission, but then developed fever. CT chest showed possible pneumonia   1. Pneumonia BL Lower lobe. Hypothermia on admission. Then febrile. CT chest: Mild airspace disease in the LEFT and RIGHT lower lobe   -Pt is clinically improving,. Fever resolved. Lactic acidosis resolved. cont empiric IV atx, pend cultures.  2. Anemia. Acute on chronic. Hg 7.4 on admission. No s/s of acute bleeding. On admission: rectal exam, there was no frank blood or melena, but occult blood testing was positive -Pt TFsed 2 units on 1/20. F/u repeat hg. cont monitor Tf prn. Pt is evaluated by GI who did not plan for endoscopy at this time  3. ESRD on HD (MWF). Cont HD per nephrology 4. DM. ha1c-6.4. Home regimen lantus 10U. Mealtime insulin 5 U. Cont lantus+ISS 5. Constipation. Patient denies any abdominal pains. abd exam is benign. Started bowel regimen, suppository. Pt still has no significant BM. He declined enema. Will try small amount of Golytely 6. DM h/o coma, CVA with Neurologic Insult, Quadriparesis, Schizoaffective Disorder, Wheelchair bound. Cont supportive care  7. HTN uncontrolled. Clonidine was on hold on admission ? Reason. Resume amlodipine, hydralazine, clonidine, labetalol. Cont HD for volume control. Titrate meds as needed    Code Status: full Family Communication: d/w patient, RN, nephrology  (indicate person spoken with, relationship, and if by phone, the number) Disposition Plan: home pend clinical improvement    Consultants:  Nephrology   Procedures:  HD  Antibiotics:  Vanc/zosyn 1/19>>>  (indicate start date, and stop date if known)  HPI/Subjective: Alert. Denies any SOB, no chest pains, denies abdominal pains   Objective: Filed Vitals:   02/26/15 1210 02/26/15 1256  BP: 181/99 181/93  Pulse: 81 92  Temp: 98.1 F (36.7 C) 98 F (36.7 C)  Resp: 15 16    Intake/Output Summary (Last 24 hours) at 02/26/15 1429 Last data filed at 02/26/15 1210  Gross per 24 hour  Intake    630 ml  Output   3696 ml  Net  -3066 ml   Filed Weights   02/25/15 2113 02/26/15 0806 02/26/15 1210  Weight: 74.2 kg (163 lb 9.3 oz) 76.9 kg (169 lb 8.5 oz) 73.3 kg (161 lb 9.6 oz)    Exam:   General:  Alert. No distress   Cardiovascular: s1,s2 rrr  Respiratory: CTA BL   Abdomen: soft, mild distended, nt  Musculoskeletal: mild pedal edema    Data Reviewed: Basic Metabolic Panel:  Recent Labs Lab 02/24/15 1707 02/26/15 0535  NA 139 138  K 4.6 4.4  CL 103 104  CO2 23 22  GLUCOSE 223* 150*  BUN 58* 76*  CREATININE 5.11* 6.93*  CALCIUM 9.0 8.4*   Liver Function Tests:  Recent Labs Lab 02/24/15 1707  AST 34  ALT 19  ALKPHOS 188*  BILITOT 0.8  PROT 7.5  ALBUMIN 4.0    Recent Labs Lab 02/24/15 1707  LIPASE 28   No results for input(s): AMMONIA in the last 168 hours. CBC:  Recent Labs Lab 02/24/15 1707 02/25/15 1113 02/26/15 0535  WBC 9.6 7.7 5.0  NEUTROABS 8.3*  --   --   HGB 7.4* 6.8* 7.0*  HCT 23.0* 20.7* 21.9*  MCV 97.5 96.7 97.8  PLT 145* 124* 120*   Cardiac Enzymes: No results for input(s): CKTOTAL, CKMB, CKMBINDEX, TROPONINI in the last 168 hours. BNP (last 3 results)  Recent Labs  02/24/15 2013  BNP 542.5*    ProBNP (last 3 results) No results for input(s): PROBNP in the last 8760 hours.  CBG:  Recent Labs Lab 02/25/15 1213 02/25/15 1601 02/25/15 2010 02/26/15 0748 02/26/15 1250  GLUCAP 109* 180* 280* 99 176*    Recent Results (from the past 240 hour(s))  Culture, blood (routine x 2)     Status: None (Preliminary result)    Collection Time: 02/24/15  8:10 PM  Result Value Ref Range Status   Specimen Description BLOOD LEFT HAND  Final   Special Requests BOTTLES DRAWN AEROBIC AND ANAEROBIC 5 ML  Final   Culture   Final    NO GROWTH 2 DAYS Performed at [facility]    Report Status PENDING  Incomplete  Culture, blood (routine x 2)     Status: None (Preliminary result)   Collection Time: 02/24/15  8:20 PM  Result Value Ref Range Status   Specimen Description BLOOD LEFT ANTECUBITAL  Final   Special Requests IN PEDIATRIC BOTTLE 3 ML  Final   Culture   Final    NO GROWTH 2 DAYS Performed at [facility]    Report Status PENDING  Incomplete  MRSA PCR Screening     Status: None   Collection Time: 02/24/15  8:40 PM  Result Value Ref Range Status   MRSA by PCR NEGATIVE NEGATIVE Final    Comment:        The GeneXpert MRSA Assay (FDA approved for NASAL specimens only), is one component of a comprehensive MRSA colonization surveillance program. It is not intended to diagnose MRSA infection nor to guide or monitor treatment for MRSA infections.      Studies: Dg Chest 2 View  02/24/2015  CLINICAL DATA:  Constipation, weakness, quadriplegia EXAM: CHEST  2 VIEW COMPARISON:  11/24/2010 FINDINGS: Cardiac size is stable. No acute infiltrate or pulmonary edema. Mild basilar atelectasis. There is soft tissue prominence right paratracheal region. Mass or adenopathy cannot be excluded. Further correlation with enhanced CT scan of the chest is recommended. IMPRESSION: No infiltrate or pulmonary edema. There is soft tissue prominence in right upper paratracheal region. Mass or adenopathy cannot be excluded. Further correlation with CT scan of the chest is recommended. Electronically Signed   By: Lahoma Crocker M.D.   On: 02/24/2015 18:21   Abd 1 View (kub)  02/24/2015  CLINICAL DATA:  Constipation starting last night, quadriparesis EXAM: ABDOMEN - 1 VIEW COMPARISON:  None. FINDINGS: Minimal gaseous distended  small bowel loops lower abdomen without air-fluid levels. Abundant stool noted in right colon. Moderate stool and gas noted in descending colon and sigmoid colon. Atherosclerotic calcifications of femoral and iliac arteries. IMPRESSION: No evidence of small bowel obstruction. Colonic stool as described above. Electronically Signed   By: Lahoma Crocker M.D.   On: 02/24/2015 20:05   Ct Chest Wo Contrast  02/25/2015  CLINICAL DATA:  Dialysis patient. Nonverbal. Soft tissue prominence in the RIGHT upper paratracheal region. EXAM: CT CHEST WITHOUT CONTRAST TECHNIQUE: Multidetector CT imaging of the chest was performed following the standard protocol without IV contrast. COMPARISON:  Chest radiograph 02/24/2015 FINDINGS: Mediastinum/Nodes: No axillary supraclavicular adenopathy. Normal thyroid gland. No mediastinal hilar adenopathy. No pericardial fluid. Coronary artery calcifications are present. Knee RIGHT paratracheal density  on comparison radiograph corresponds to an ectatic superior vena cava. Lungs/Pleura: There bilateral pleural effusions. There is mild airspace disease in the RIGHT lower lobe (image 42, series 3) airways are normal. Upper abdomen: Limited view of the liver, kidneys, pancreas are unremarkable. Normal adrenal glands. Mild airspace disease in the LEFT lower lobe to a lesser degree. Musculoskeletal: Diffuse sclerosis of the bones.  No acute findings. IMPRESSION: 1. Ectatic superior vena cava corresponds to the plain film abnormality, a benign finding. 2. Bilateral pleural effusions. 3. Mild airspace disease in the LEFT and RIGHT lower lobe consistent with atelectasis, pneumonia, or aspiration. Electronically Signed   By: Suzy Bouchard M.D.   On: 02/25/2015 14:17    Scheduled Meds: . sodium chloride   Intravenous Once  . amLODipine  10 mg Oral QHS  . atropine  1 drop Left Eye BID  . bisacodyl  10 mg Rectal Once  . cinacalcet  30 mg Oral Daily  . darbepoetin (ARANESP) injection - DIALYSIS   200 mcg Intravenous Q Fri-HD  . feeding supplement (PRO-STAT SUGAR FREE 64)  30 mL Oral BID  . hydrALAZINE  100 mg Oral TID  . insulin aspart  0-9 Units Subcutaneous TID WC  . insulin glargine  10 Units Subcutaneous QHS  . multivitamin  1 tablet Oral Daily  . pantoprazole  40 mg Oral QHS  . piperacillin-tazobactam (ZOSYN)  IV  2.25 g Intravenous 3 times per day  . polyethylene glycol  34 g Oral Daily  . prednisoLONE acetate  1 drop Left Eye BID  . risperiDONE  2 mg Oral BID  . senna-docusate  1 tablet Oral BID  . sevelamer carbonate  800 mg Oral TID WC  . simvastatin  20 mg Oral QHS  . sodium chloride  3 mL Intravenous Q12H  . sodium chloride  3 mL Intravenous Q12H  . temazepam  15 mg Oral QHS  . Vancomycin      . vancomycin  750 mg Intravenous Q M,W,F-HD   Continuous Infusions:   Principal Problem:   Constipation, acute Active Problems:   ESRD (end stage renal disease) on dialysis (HCC)   Normocytic anemia   Occult GI bleeding   Hypertension   Thrombocytopenia (HCC)   Hypothermia   Elevated lactic acid level   Hyperthyroidism   Anemia due to GI blood loss   Schizophrenia ([place])    Time spent: >35 minutes     Kinnie Feil  [facility] Pager [phone]. If 7PM-7AM, please contact night-coverage at [email or web address], password [facility] 02/26/2015, 2:29 PM  LOS: 2 days

## 2015-02-26 NOTE — Progress Notes (Signed)
Pharmacy Antibiotic Follow-up Note  [name] is a 45 y.o. year-old male admitted on 02/24/2015.  The patient is currently on day 2 of vancomycin and Zosyn for r/o sepsis.  Assessment/Plan: The dose of vancomycin will be adjusted to 750mg  IV qHD- MWF based on HD schedule. Continue Zosyn 2.25g IV q8h  Temp (24hrs), Avg:98.1 F (36.7 C), Min:97.9 F (36.6 C), Max:98.4 F (36.9 C)   Recent Labs Lab 02/24/15 1707 02/25/15 1113 02/26/15 0535  WBC 9.6 7.7 5.0    Recent Labs Lab 02/24/15 1707 02/26/15 0535  CREATININE 5.11* 6.93*   CrCl cannot be calculated (Unknown ideal weight.).    Allergies  Allergen Reactions  . Lactose Intolerance (Gi) Diarrhea  . Polymyxin B Swelling    "Can't take this eye drop," per outside records.   . Sulfamethoxazole Rash    "Swelling of area, face, from drop that had sulfa," per outside records.     Antimicrobials this admission: Vancomycin 1/19>> Zosyn 1/19>>  Levels/dose changes this admission: n/a  Microbiology results: 1/18 MRSA PCR: neg 1/18 BCx: ngtd  Thank you for allowing pharmacy to be a part of this patient's care.  Kitara Hebb D. Nadiya Pieratt, PharmD, BCPS Clinical Pharmacist Pager: [phone] 02/26/2015 8:27 AM

## 2015-02-26 NOTE — Procedures (Signed)
I was present at this session.  I have reviewed the session itself and made appropriate changes.  bp ^ lower wgt.  Access cannulate with 2 needles up within 2 FB ??why.  Recirc. Counseled staff.  To get blood  Senita Corredor L 1/20/20179:13 AM

## 2015-02-26 NOTE — Progress Notes (Signed)
Subjective: Interval History: has complaints still constip.  Objective: Vital signs in last 24 hours: Temp:  [97.9 F (36.6 C)-98.4 F (36.9 C)] 97.9 F (36.6 C) (01/20 0806) Pulse Rate:  [78-96] 88 (01/20 0900) Resp:  [10-20] 20 (01/20 0900) BP: (125-176)/(69-96) 157/91 mmHg (01/20 0900) SpO2:  [98 %-100 %] 100 % (01/20 0806) Weight:  [74.2 kg (163 lb 9.3 oz)-76.9 kg (169 lb 8.5 oz)] 76.9 kg (169 lb 8.5 oz) (01/20 0806) Weight change: 0.2 kg (7.1 oz)  Intake/Output from previous day: 01/19 0701 - 01/20 0700 In: 1110 [P.O.:960; IV Piggyback:150] Out: 0  Intake/Output this shift:    General appearance: cooperative, no distress, mildly obese, pale and uncooperative Resp: clear to auscultation bilaterally Cardio: S1, S2 normal and systolic murmur: holosystolic 2/6, blowing at apex GI: mod distension, pos bs,  Extremities: edema 1+ and AVF RUA  Lab Results:  Recent Labs  02/25/15 1113 02/26/15 0535  WBC 7.7 5.0  HGB 6.8* 7.0*  HCT 20.7* 21.9*  PLT 124* 120*   BMET:  Recent Labs  02/24/15 1707 02/26/15 0535  NA 139 138  K 4.6 4.4  CL 103 104  CO2 23 22  GLUCOSE 223* 150*  BUN 58* 76*  CREATININE 5.11* 6.93*  CALCIUM 9.0 8.4*   No results for input(s): PTH in the last 72 hours. Iron Studies: No results for input(s): IRON, TIBC, TRANSFERRIN, FERRITIN in the last 72 hours.  Studies/Results: Dg Chest 2 View  02/24/2015  CLINICAL DATA:  Constipation, weakness, quadriplegia EXAM: CHEST  2 VIEW COMPARISON:  11/24/2010 FINDINGS: Cardiac size is stable. No acute infiltrate or pulmonary edema. Mild basilar atelectasis. There is soft tissue prominence right paratracheal region. Mass or adenopathy cannot be excluded. Further correlation with enhanced CT scan of the chest is recommended. IMPRESSION: No infiltrate or pulmonary edema. There is soft tissue prominence in right upper paratracheal region. Mass or adenopathy cannot be excluded. Further correlation with CT scan of  the chest is recommended. Electronically Signed   By: Lahoma Crocker M.D.   On: 02/24/2015 18:21   Abd 1 View (kub)  02/24/2015  CLINICAL DATA:  Constipation starting last night, quadriparesis EXAM: ABDOMEN - 1 VIEW COMPARISON:  None. FINDINGS: Minimal gaseous distended small bowel loops lower abdomen without air-fluid levels. Abundant stool noted in right colon. Moderate stool and gas noted in descending colon and sigmoid colon. Atherosclerotic calcifications of femoral and iliac arteries. IMPRESSION: No evidence of small bowel obstruction. Colonic stool as described above. Electronically Signed   By: Lahoma Crocker M.D.   On: 02/24/2015 20:05   Ct Chest Wo Contrast  02/25/2015  CLINICAL DATA:  Dialysis patient. Nonverbal. Soft tissue prominence in the RIGHT upper paratracheal region. EXAM: CT CHEST WITHOUT CONTRAST TECHNIQUE: Multidetector CT imaging of the chest was performed following the standard protocol without IV contrast. COMPARISON:  Chest radiograph 02/24/2015 FINDINGS: Mediastinum/Nodes: No axillary supraclavicular adenopathy. Normal thyroid gland. No mediastinal hilar adenopathy. No pericardial fluid. Coronary artery calcifications are present. Knee RIGHT paratracheal density on comparison radiograph corresponds to an ectatic superior vena cava. Lungs/Pleura: There bilateral pleural effusions. There is mild airspace disease in the RIGHT lower lobe (image 42, series 3) airways are normal. Upper abdomen: Limited view of the liver, kidneys, pancreas are unremarkable. Normal adrenal glands. Mild airspace disease in the LEFT lower lobe to a lesser degree. Musculoskeletal: Diffuse sclerosis of the bones.  No acute findings. IMPRESSION: 1. Ectatic superior vena cava corresponds to the plain film abnormality, a benign finding. 2.  Bilateral pleural effusions. 3. Mild airspace disease in the LEFT and RIGHT lower lobe consistent with atelectasis, pneumonia, or aspiration. Electronically Signed   By: Suzy Bouchard  M.D.   On: 02/25/2015 14:17    I have reviewed the patient's current medications.  Assessment/Plan: 1 ESRD for HD, vol xs.  2 HTN lower vol and meds 3 Anemia to get blood, cont esa 4 Blind 5 Schizo 6 quad 7 obstipation per primary 8 GIB  9 DM per primary    LOS: 2 days   Mikko Lewellen L 02/26/2015,9:15 AM

## 2015-02-27 DIAGNOSIS — J690 Pneumonitis due to inhalation of food and vomit: Secondary | ICD-10-CM

## 2015-02-27 LAB — TYPE AND SCREEN
ABO/RH(D): B NEG
Antibody Screen: NEGATIVE
Unit division: 0
Unit division: 0

## 2015-02-27 LAB — HEPATITIS B SURFACE ANTIGEN: Hepatitis B Surface Ag: NEGATIVE

## 2015-02-27 LAB — GLUCOSE, CAPILLARY
Glucose-Capillary: 224 mg/dL — ABNORMAL HIGH (ref 65–99)
Glucose-Capillary: 68 mg/dL (ref 65–99)
Glucose-Capillary: 75 mg/dL (ref 65–99)
Glucose-Capillary: 123 mg/dL — ABNORMAL HIGH (ref 65–99)

## 2015-02-27 MED ORDER — LABETALOL HCL 100 MG PO TABS: 100.0000 mg | ORAL_TABLET | Freq: Two times a day (BID) | ORAL | Status: DC

## 2015-02-27 MED ORDER — AMLODIPINE BESYLATE 10 MG PO TABS: 10.0000 mg | ORAL_TABLET | Freq: Every day | ORAL | Status: DC

## 2015-02-27 MED ORDER — HYDRALAZINE HCL 50 MG PO TABS
100.0000 mg | ORAL_TABLET | Freq: Two times a day (BID) | ORAL | Status: DC
  Administered 2015-02-27: 100 mg via ORAL
  Filled 2015-02-27: qty 2

## 2015-02-27 MED ORDER — AMOXICILLIN-POT CLAVULANATE 875-125 MG PO TABS: 1.0000 | ORAL_TABLET | Freq: Two times a day (BID) | ORAL | Status: DC

## 2015-02-27 MED ORDER — LABETALOL HCL 100 MG PO TABS
100.0000 mg | ORAL_TABLET | Freq: Two times a day (BID) | ORAL | Status: DC
  Administered 2015-02-27: 100 mg via ORAL
  Filled 2015-02-27: qty 1

## 2015-02-27 MED ORDER — HYDRALAZINE HCL 100 MG PO TABS: 100.0000 mg | ORAL_TABLET | Freq: Three times a day (TID) | ORAL | Status: DC

## 2015-02-27 MED ORDER — AMOXICILLIN-POT CLAVULANATE 250-125 MG PO TABS: 1.0000 | ORAL_TABLET | Freq: Every day | ORAL | Status: DC

## 2015-02-27 MED ORDER — BISACODYL 10 MG RE SUPP: 10.0000 mg | Freq: Every day | RECTAL | Status: DC | PRN

## 2015-02-27 MED ORDER — GLUCOSE 40 % PO GEL
ORAL | Status: AC
  Filled 2015-02-27: qty 1

## 2015-02-27 MED ORDER — CLONIDINE HCL 0.1 MG PO TABS: 0.1000 mg | ORAL_TABLET | Freq: Three times a day (TID) | ORAL | Status: DC

## 2015-02-27 MED ORDER — CLONIDINE HCL 0.1 MG PO TABS
0.1000 mg | ORAL_TABLET | Freq: Two times a day (BID) | ORAL | Status: DC
  Administered 2015-02-27: 0.1 mg via ORAL
  Filled 2015-02-27: qty 1

## 2015-02-27 MED ORDER — SENNOSIDES-DOCUSATE SODIUM 8.6-50 MG PO TABS: 1.0000 | ORAL_TABLET | Freq: Two times a day (BID) | ORAL | Status: DC

## 2015-02-27 MED ORDER — AMOXICILLIN-POT CLAVULANATE 250-125 MG PO TABS
1.0000 | ORAL_TABLET | Freq: Every day | ORAL | Status: DC
  Filled 2015-02-27: qty 1

## 2015-02-27 NOTE — Progress Notes (Signed)
Subjective: Interval History: has complaints wants bread for lunch and does not want to go home.  Objective: Vital signs in last 24 hours: Temp:  [97.1 F (36.2 C)-99.7 F (37.6 C)] 98.2 F (36.8 C) (01/21 0550) Pulse Rate:  [80-92] 86 (01/21 0550) Resp:  [12-20] 20 (01/21 0550) BP: (141-182)/(80-99) 156/86 mmHg (01/21 0550) SpO2:  [96 %-100 %] 100 % (01/21 0550) Weight:  [73.3 kg (161 lb 9.6 oz)] 73.3 kg (161 lb 9.6 oz) (01/20 1210) Weight change: 2.7 kg (5 lb 15.2 oz)  Intake/Output from previous day: 01/20 0701 - 01/21 0700 In: [id number] [P.O.:1320; IV Piggyback:150] Out: 3696  Intake/Output this shift:    General appearance: pale, slowed mentation and blind Resp: diminished breath sounds bilaterally Cardio: S1, S2 normal and systolic murmur: holosystolic 2/6, blowing at apex GI: soft, non-tender; bowel sounds normal; no masses,  no organomegaly Extremities: AVF RUA  Lab Results:  Recent Labs  02/26/15 0535 02/26/15 1610  WBC 5.0 5.2  HGB 7.0* 9.7*  HCT 21.9* 28.3*  PLT 120* 128*   BMET:  Recent Labs  02/24/15 1707 02/26/15 0535  NA 139 138  K 4.6 4.4  CL 103 104  CO2 23 22  GLUCOSE 223* 150*  BUN 58* 76*  CREATININE 5.11* 6.93*  CALCIUM 9.0 8.4*   No results for input(s): PTH in the last 72 hours. Iron Studies:  Recent Labs  02/26/15 0820  IRON 77  TIBC 213*  FERRITIN 799*    Studies/Results: Ct Chest Wo Contrast  02/25/2015  CLINICAL DATA:  Dialysis patient. Nonverbal. Soft tissue prominence in the RIGHT upper paratracheal region. EXAM: CT CHEST WITHOUT CONTRAST TECHNIQUE: Multidetector CT imaging of the chest was performed following the standard protocol without IV contrast. COMPARISON:  Chest radiograph 02/24/2015 FINDINGS: Mediastinum/Nodes: No axillary supraclavicular adenopathy. Normal thyroid gland. No mediastinal hilar adenopathy. No pericardial fluid. Coronary artery calcifications are present. Knee RIGHT paratracheal density on comparison  radiograph corresponds to an ectatic superior vena cava. Lungs/Pleura: There bilateral pleural effusions. There is mild airspace disease in the RIGHT lower lobe (image 42, series 3) airways are normal. Upper abdomen: Limited view of the liver, kidneys, pancreas are unremarkable. Normal adrenal glands. Mild airspace disease in the LEFT lower lobe to a lesser degree. Musculoskeletal: Diffuse sclerosis of the bones.  No acute findings. IMPRESSION: 1. Ectatic superior vena cava corresponds to the plain film abnormality, a benign finding. 2. Bilateral pleural effusions. 3. Mild airspace disease in the LEFT and RIGHT lower lobe consistent with atelectasis, pneumonia, or aspiration. Electronically Signed   By: Suzy Bouchard M.D.   On: 02/25/2015 14:17    I have reviewed the patient's current medications.  Assessment/Plan: 1 ESRD HD MWF stable, lower vol 2 HTN better with lower vol, lower meds 3 Schizo volatile 4 quadriparesis 5 Fevers resolved C&S neg.  ? Just obstipation 6 constip on regimen 7 GIB HB ok post [place] P follow HB, esa,  ? D/c ab soon.    LOS: 3 days   Raydell Maners L 02/27/2015,9:26 AM

## 2015-02-27 NOTE — Discharge Summary (Addendum)
Physician Discharge Summary  [name] [id number] DOB: 09/15/1970 DOA: 02/24/2015  PCP: Maggie Font, MD  Admit date: 02/24/2015 Discharge date: 02/27/2015  Time spent: >35 minutes  Recommendations for Outpatient Follow-up:  F/u with HD as scheduled  F/u with PCP in 1 week as needed  Discharge Diagnoses:  Principal Problem:   Constipation, acute Active Problems:   ESRD (end stage renal disease) on dialysis ([place])   Normocytic anemia   Occult GI bleeding   Hypertension   Thrombocytopenia (HCC)   Hypothermia   Elevated lactic acid level   Hyperthyroidism   Anemia due to GI blood loss   Schizophrenia [facility])   Discharge Condition: stable   Diet recommendation: DM, renal   Filed Weights   02/25/15 2113 02/26/15 0806 02/26/15 1210  Weight: 74.2 kg (163 lb 9.3 oz) 76.9 kg (169 lb 8.5 oz) 73.3 kg (161 lb 9.6 oz)    History of present illness:  45 y/o male with PMH of DM h/o coma, CVA with Neurologic Insult, Quadriparesis, Schizoaffective Disorder, Wheelchair bound, ESRD on HD (MWF), HTN, initially presented with constipation. Pt noted to have anemia with Hg 7.4 on admission. Also found to have hypothermia on admission, but then developed fever. CT chest showed possible pneumonia    Hospital Course:  1. Probable Pneumonia. Hypothermia on admission. Then febrile. CT chest: Mild airspace disease in the LEFT and RIGHT lower lobe  -Pt remained clinically stable. Fever resolved. Lactic acidosis resolved. He received empiric IV atx (1/19-1/21), blodo cultures: NGTD. He is transitioned to oral atx regimen to complete treatment course  2. Anemia. Acute on chronic likely renal. Hg 7.4 on admission. No s/s of acute bleeding. On admission: rectal exam, there was no frank blood or melena, but occult blood testing was positive -Pt TFsed 2 units on 1/20. F/u repeat hg is stable.  Pt is evaluated by GI who did not plan for endoscopy at this time  3. ESRD on HD (MWF). Cont HD  4. DM.  ha1c-6.4. Home regimen insulin  5. Constipation. Patient denies any abdominal pains. abd exam is benign.  -constipation resolved after small amount Golytely. He is started on daily bowel regimen to prevent chronic constipation  6. DM h/o coma, CVA with Neurologic Insult, Quadriparesis, Schizoaffective Disorder, Wheelchair bound. Cont supportive care  7. HTN uncontrolled on admission. Clonidine was on hold on admission ? Reason.  -BP is improved after resuming amlodipine, hydralazine, clonidine, labetalol. Cont HD for volume control   Patient is being discharge today. Update his caregiver   Procedures:  HD (i.e. Studies not automatically included, echos, thoracentesis, etc; not x-rays)  Consultations:  Nephrology   Discharge Exam: Filed Vitals:   02/26/15 2102 02/27/15 0550  BP: 153/80 156/86  Pulse: 86 86  Temp: 99.7 F (37.6 C) 98.2 F (36.8 C)  Resp: 19 20    General: no distress  Cardiovascular: s1,s2 rrr Respiratory: CTA BL   Discharge Instructions     Medication List    STOP taking these medications        losartan 100 MG tablet  Commonly known as:  COZAAR      TAKE these medications        amLODipine 10 MG tablet  Commonly known as:  NORVASC  Take 1 tablet (10 mg total) by mouth at bedtime.     amoxicillin-clavulanate 250-125 MG tablet  Commonly known as:  AUGMENTIN  Take 1 tablet by mouth daily.     atropine 1 % ophthalmic solution  Place 1 drop into the left eye 2 (two) times daily.     bisacodyl 10 MG suppository  Commonly known as:  DULCOLAX  Place 1 suppository (10 mg total) rectally daily as needed for moderate constipation.     ciclopirox 8 % solution  Commonly known as:  PENLAC  Apply 1 application topically at bedtime. Apply over nail and surrounding skin. Apply daily over previous coat. After seven (7) days, may remove with alcohol and continue cycle.     cinacalcet 30 MG tablet  Commonly known as:  SENSIPAR  Take 30 mg by mouth  daily.     cloNIDine 0.1 MG tablet  Commonly known as:  CATAPRES  Take 1 tablet (0.1 mg total) by mouth 3 (three) times daily.     hydrALAZINE 100 MG tablet  Commonly known as:  APRESOLINE  Take 1 tablet (100 mg total) by mouth 3 (three) times daily.     insulin glargine 100 UNIT/ML injection  Commonly known as:  LANTUS  Inject 10 Units into the skin daily.     insulin lispro 100 UNIT/ML injection  Commonly known as:  HUMALOG  Inject 5 Units into the skin 3 (three) times daily before meals. Inject additional units for blood sugar levels that are 301-350=6 units, 351-400=8 units.     ketoconazole 2 % cream  Commonly known as:  NIZORAL  Apply 1 application topically 2 (two) times daily.     labetalol 100 MG tablet  Commonly known as:  NORMODYNE  Take 1 tablet (100 mg total) by mouth 2 (two) times daily.     LORazepam 1 MG tablet  Commonly known as:  ATIVAN  Take 1 mg by mouth every 8 (eight) hours as needed. For anxiety     multivitamin Tabs tablet  Take 1 tablet by mouth daily.     nitroGLYCERIN 0.4 MG SL tablet  Commonly known as:  NITROSTAT  Place 0.4 mg under the tongue every 5 (five) minutes as needed. For chest pain     pantoprazole 40 MG tablet  Commonly known as:  PROTONIX  Take 40 mg by mouth daily.     prednisoLONE acetate 1 % ophthalmic suspension  Commonly known as:  PRED FORTE  Place 1 drop into the left eye 2 (two) times daily.     risperiDONE 2 MG tablet  Commonly known as:  RISPERDAL  Take 2 mg by mouth 2 (two) times daily.     senna-docusate 8.6-50 MG tablet  Commonly known as:  Senokot-S  Take 1 tablet by mouth 2 (two) times daily.     sevelamer carbonate 800 MG tablet  Commonly known as:  RENVELA  Take 800 mg by mouth 3 (three) times daily.     simvastatin 20 MG tablet  Commonly known as:  ZOCOR  Take 20 mg by mouth at bedtime.     temazepam 15 MG capsule  Commonly known as:  RESTORIL  Take 15 mg by mouth at bedtime.       Allergies   Allergen Reactions  . Lactose Intolerance (Gi) Diarrhea  . Polymyxin B Swelling    "Can't take this eye drop," per outside records.   . Sulfamethoxazole Rash    "Swelling of area, face, from drop that had sulfa," per outside records.        Follow-up Information    Follow up with [facility] K, MD In 1 week.   Specialty:  Family Medicine   Contact information:   [id number]  [place] [address] [phone]        The results of significant diagnostics from this hospitalization (including imaging, microbiology, ancillary and laboratory) are listed below for reference.    Significant Diagnostic Studies: Dg Chest 2 View  02/24/2015  CLINICAL DATA:  Constipation, weakness, quadriplegia EXAM: CHEST  2 VIEW COMPARISON:  11/24/2010 FINDINGS: Cardiac size is stable. No acute infiltrate or pulmonary edema. Mild basilar atelectasis. There is soft tissue prominence right paratracheal region. Mass or adenopathy cannot be excluded. Further correlation with enhanced CT scan of the chest is recommended. IMPRESSION: No infiltrate or pulmonary edema. There is soft tissue prominence in right upper paratracheal region. Mass or adenopathy cannot be excluded. Further correlation with CT scan of the chest is recommended. Electronically Signed   By: Lahoma Crocker M.D.   On: 02/24/2015 18:21   Abd 1 View (kub)  02/24/2015  CLINICAL DATA:  Constipation starting last night, quadriparesis EXAM: ABDOMEN - 1 VIEW COMPARISON:  None. FINDINGS: Minimal gaseous distended small bowel loops lower abdomen without air-fluid levels. Abundant stool noted in right colon. Moderate stool and gas noted in descending colon and sigmoid colon. Atherosclerotic calcifications of femoral and iliac arteries. IMPRESSION: No evidence of small bowel obstruction. Colonic stool as described above. Electronically Signed   By: Lahoma Crocker M.D.   On: 02/24/2015 20:05   Ct Chest Wo Contrast  02/25/2015  CLINICAL DATA:  Dialysis patient.  Nonverbal. Soft tissue prominence in the RIGHT upper paratracheal region. EXAM: CT CHEST WITHOUT CONTRAST TECHNIQUE: Multidetector CT imaging of the chest was performed following the standard protocol without IV contrast. COMPARISON:  Chest radiograph 02/24/2015 FINDINGS: Mediastinum/Nodes: No axillary supraclavicular adenopathy. Normal thyroid gland. No mediastinal hilar adenopathy. No pericardial fluid. Coronary artery calcifications are present. Knee RIGHT paratracheal density on comparison radiograph corresponds to an ectatic superior vena cava. Lungs/Pleura: There bilateral pleural effusions. There is mild airspace disease in the RIGHT lower lobe (image 42, series 3) airways are normal. Upper abdomen: Limited view of the liver, kidneys, pancreas are unremarkable. Normal adrenal glands. Mild airspace disease in the LEFT lower lobe to a lesser degree. Musculoskeletal: Diffuse sclerosis of the bones.  No acute findings. IMPRESSION: 1. Ectatic superior vena cava corresponds to the plain film abnormality, a benign finding. 2. Bilateral pleural effusions. 3. Mild airspace disease in the LEFT and RIGHT lower lobe consistent with atelectasis, pneumonia, or aspiration. Electronically Signed   By: Suzy Bouchard M.D.   On: 02/25/2015 14:17    Microbiology: Recent Results (from the past 240 hour(s))  Culture, blood (routine x 2)     Status: None (Preliminary result)   Collection Time: 02/24/15  8:10 PM  Result Value Ref Range Status   Specimen Description BLOOD LEFT HAND  Final   Special Requests BOTTLES DRAWN AEROBIC AND ANAEROBIC 5 ML  Final   Culture   Final    NO GROWTH 2 DAYS Performed at [facility]    Report Status PENDING  Incomplete  Culture, blood (routine x 2)     Status: None (Preliminary result)   Collection Time: 02/24/15  8:20 PM  Result Value Ref Range Status   Specimen Description BLOOD LEFT ANTECUBITAL  Final   Special Requests IN PEDIATRIC BOTTLE 3 ML  Final   Culture    Final    NO GROWTH 2 DAYS Performed at [facility] [place]    Report Status PENDING  Incomplete  MRSA PCR Screening     Status: None  Collection Time: 02/24/15  8:40 PM  Result Value Ref Range Status   MRSA by PCR NEGATIVE NEGATIVE Final    Comment:        The GeneXpert MRSA Assay (FDA approved for NASAL specimens only), is one component of a comprehensive MRSA colonization surveillance program. It is not intended to diagnose MRSA infection nor to guide or monitor treatment for MRSA infections.      Labs: Basic Metabolic Panel:  Recent Labs Lab 02/24/15 1707 02/26/15 0535  NA 139 138  K 4.6 4.4  CL 103 104  CO2 23 22  GLUCOSE 223* 150*  BUN 58* 76*  CREATININE 5.11* 6.93*  CALCIUM 9.0 8.4*   Liver Function Tests:  Recent Labs Lab 02/24/15 1707  AST 34  ALT 19  ALKPHOS 188*  BILITOT 0.8  PROT 7.5  ALBUMIN 4.0    Recent Labs Lab 02/24/15 1707  LIPASE 28   No results for input(s): AMMONIA in the last 168 hours. CBC:  Recent Labs Lab 02/24/15 1707 02/25/15 1113 02/26/15 0535 02/26/15 1610  WBC 9.6 7.7 5.0 5.2  NEUTROABS 8.3*  --   --   --   HGB 7.4* 6.8* 7.0* 9.7*  HCT 23.0* 20.7* 21.9* 28.3*  MCV 97.5 96.7 97.8 92.8  PLT 145* 124* 120* 128*   Cardiac Enzymes: No results for input(s): CKTOTAL, CKMB, CKMBINDEX, TROPONINI in the last 168 hours. BNP: BNP (last 3 results)  Recent Labs  02/24/15 2013  BNP 542.5*    ProBNP (last 3 results) No results for input(s): PROBNP in the last 8760 hours.  CBG:  Recent Labs Lab 02/26/15 1250 02/26/15 1629 02/26/15 2204 02/27/15 0742 02/27/15 0835  GLUCAP 176* 228* 224* 68 75       Signed:  Garrit Marrow [place]  [facility] 02/27/2015, 11:31 AM  Addendum: probable patient did not have bowel ischemia  [name]

## 2015-02-28 LAB — TYPE AND SCREEN
ABO/RH(D): B NEG
Antibody Screen: NEGATIVE
Unit division: 0
Unit division: 0

## 2015-03-01 LAB — CULTURE, BLOOD (ROUTINE X 2)
Culture: NO GROWTH
Culture: NO GROWTH

## 2015-03-08 ENCOUNTER — Inpatient Hospital Stay (HOSPITAL_COMMUNITY)
Admission: EM | Admit: 2015-03-08 | Discharge: 2015-03-11 | DRG: 193 | Disposition: A | Discharge status: Home / Self Care | Payer: Medicaid Other | Attending: Internal Medicine | Admitting: Internal Medicine

## 2015-03-08 ENCOUNTER — Encounter (HOSPITAL_COMMUNITY): Payer: Self-pay

## 2015-03-08 ENCOUNTER — Emergency Department (HOSPITAL_COMMUNITY): Payer: Medicaid Other

## 2015-03-08 DIAGNOSIS — I120 Hypertensive chronic kidney disease with stage 5 chronic kidney disease or end stage renal disease: Secondary | ICD-10-CM | POA: Diagnosis present

## 2015-03-08 DIAGNOSIS — F259 Schizoaffective disorder, unspecified: Secondary | ICD-10-CM | POA: Diagnosis present

## 2015-03-08 DIAGNOSIS — E875 Hyperkalemia: Secondary | ICD-10-CM | POA: Diagnosis present

## 2015-03-08 DIAGNOSIS — Z794 Long term (current) use of insulin: Secondary | ICD-10-CM

## 2015-03-08 DIAGNOSIS — IMO0001 Reserved for inherently not codable concepts without codable children: Secondary | ICD-10-CM | POA: Diagnosis present

## 2015-03-08 DIAGNOSIS — K5909 Other constipation: Secondary | ICD-10-CM | POA: Diagnosis present

## 2015-03-08 DIAGNOSIS — J189 Pneumonia, unspecified organism: Secondary | ICD-10-CM | POA: Diagnosis present

## 2015-03-08 DIAGNOSIS — G8250 Quadriplegia, unspecified: Secondary | ICD-10-CM | POA: Diagnosis present

## 2015-03-08 DIAGNOSIS — E0590 Thyrotoxicosis, unspecified without thyrotoxic crisis or storm: Secondary | ICD-10-CM | POA: Diagnosis present

## 2015-03-08 DIAGNOSIS — Y95 Nosocomial condition: Secondary | ICD-10-CM | POA: Diagnosis present

## 2015-03-08 DIAGNOSIS — F2089 Other schizophrenia: Secondary | ICD-10-CM

## 2015-03-08 DIAGNOSIS — Z992 Dependence on renal dialysis: Secondary | ICD-10-CM

## 2015-03-08 DIAGNOSIS — E039 Hypothyroidism, unspecified: Secondary | ICD-10-CM | POA: Diagnosis present

## 2015-03-08 DIAGNOSIS — E1122 Type 2 diabetes mellitus with diabetic chronic kidney disease: Secondary | ICD-10-CM | POA: Diagnosis present

## 2015-03-08 DIAGNOSIS — I10 Essential (primary) hypertension: Secondary | ICD-10-CM | POA: Diagnosis present

## 2015-03-08 DIAGNOSIS — N186 End stage renal disease: Secondary | ICD-10-CM | POA: Diagnosis present

## 2015-03-08 DIAGNOSIS — E8889 Other specified metabolic disorders: Secondary | ICD-10-CM | POA: Diagnosis present

## 2015-03-08 DIAGNOSIS — F209 Schizophrenia, unspecified: Secondary | ICD-10-CM | POA: Diagnosis present

## 2015-03-08 DIAGNOSIS — R0602 Shortness of breath: Secondary | ICD-10-CM | POA: Diagnosis present

## 2015-03-08 DIAGNOSIS — M8660 Other chronic osteomyelitis, unspecified site: Secondary | ICD-10-CM | POA: Diagnosis present

## 2015-03-08 DIAGNOSIS — D631 Anemia in chronic kidney disease: Secondary | ICD-10-CM | POA: Diagnosis present

## 2015-03-08 DIAGNOSIS — E785 Hyperlipidemia, unspecified: Secondary | ICD-10-CM | POA: Diagnosis present

## 2015-03-08 DIAGNOSIS — D696 Thrombocytopenia, unspecified: Secondary | ICD-10-CM | POA: Diagnosis present

## 2015-03-08 DIAGNOSIS — R079 Chest pain, unspecified: Secondary | ICD-10-CM | POA: Diagnosis not present

## 2015-03-08 DIAGNOSIS — J9601 Acute respiratory failure with hypoxia: Secondary | ICD-10-CM

## 2015-03-08 HISTORY — DX: Pneumonia, unspecified organism: J18.9

## 2015-03-08 HISTORY — DX: Acute respiratory failure with hypoxia: J96.01

## 2015-03-08 HISTORY — DX: Chest pain, unspecified: R07.9

## 2015-03-08 HISTORY — DX: Anemia, unspecified: D64.9

## 2015-03-08 LAB — I-STAT CHEM 8, ED
BUN: 59 mg/dL — ABNORMAL HIGH (ref 6–20)
Calcium, Ion: 1.13 mmol/L (ref 1.12–1.23)
Chloride: 106 mmol/L (ref 101–111)
Creatinine, Ser: 6.5 mg/dL — ABNORMAL HIGH (ref 0.61–1.24)
Glucose, Bld: 244 mg/dL — ABNORMAL HIGH (ref 65–99)
HCT: 35 % — ABNORMAL LOW (ref 39.0–52.0)
Hemoglobin: 11.9 g/dL — ABNORMAL LOW (ref 13.0–17.0)
Potassium: 5.2 mmol/L — ABNORMAL HIGH (ref 3.5–5.1)
Sodium: 139 mmol/L (ref 135–145)
TCO2: 20 mmol/L (ref 0–100)

## 2015-03-08 LAB — COMPREHENSIVE METABOLIC PANEL
ALT: 15 U/L — ABNORMAL LOW (ref 17–63)
AST: 30 U/L (ref 15–41)
Albumin: 3.4 g/dL — ABNORMAL LOW (ref 3.5–5.0)
Alkaline Phosphatase: 155 U/L — ABNORMAL HIGH (ref 38–126)
Anion gap: 17 — ABNORMAL HIGH (ref 5–15)
BUN: 66 mg/dL — ABNORMAL HIGH (ref 6–20)
CO2: 20 mmol/L — ABNORMAL LOW (ref 22–32)
Calcium: 9.3 mg/dL (ref 8.9–10.3)
Chloride: 103 mmol/L (ref 101–111)
Creatinine, Ser: 6.99 mg/dL — ABNORMAL HIGH (ref 0.61–1.24)
GFR calc Af Amer: 10 mL/min — ABNORMAL LOW (ref >60)
GFR calc non Af Amer: 9 mL/min — ABNORMAL LOW (ref >60)
Glucose, Bld: 242 mg/dL — ABNORMAL HIGH (ref 65–99)
Potassium: 5.4 mmol/L — ABNORMAL HIGH (ref 3.5–5.1)
Sodium: 140 mmol/L (ref 135–145)
Total Bilirubin: 1 mg/dL (ref 0.3–1.2)
Total Protein: 7.2 g/dL (ref 6.5–8.1)

## 2015-03-08 LAB — CBC WITH DIFFERENTIAL/PLATELET
Basophils Absolute: 0 10*3/uL (ref 0.0–0.1)
Basophils Relative: 0 %
Eosinophils Absolute: 0 10*3/uL (ref 0.0–0.7)
Eosinophils Relative: 0 %
HCT: 32 % — ABNORMAL LOW (ref 39.0–52.0)
Hemoglobin: 10.3 g/dL — ABNORMAL LOW (ref 13.0–17.0)
Lymphocytes Relative: 6 %
Lymphs Abs: 0.9 10*3/uL (ref 0.7–4.0)
MCH: 32.5 pg (ref 26.0–34.0)
MCHC: 32.2 g/dL (ref 30.0–36.0)
MCV: 100.9 fL — ABNORMAL HIGH (ref 78.0–100.0)
Monocytes Absolute: 0.9 10*3/uL (ref 0.1–1.0)
Monocytes Relative: 6 %
Neutro Abs: 14.8 10*3/uL — ABNORMAL HIGH (ref 1.7–7.7)
Neutrophils Relative %: 88 %
Platelets: 130 10*3/uL — ABNORMAL LOW (ref 150–400)
RBC: 3.17 MIL/uL — ABNORMAL LOW (ref 4.22–5.81)
RDW: 19.1 % — ABNORMAL HIGH (ref 11.5–15.5)
WBC: 16.7 10*3/uL — ABNORMAL HIGH (ref 4.0–10.5)

## 2015-03-08 LAB — TROPONIN I
Troponin I: <0.03 ng/mL (ref <0.031)
Troponin I: 0.03 ng/mL (ref <0.031)

## 2015-03-08 LAB — I-STAT VENOUS BLOOD GAS, ED
Acid-base deficit: 5 mmol/L — ABNORMAL HIGH (ref 0.0–2.0)
Bicarbonate: 20.4 mEq/L (ref 20.0–24.0)
O2 Saturation: 89 %
TCO2: 22 mmol/L (ref 0–100)
pCO2, Ven: 38.2 mmHg — ABNORMAL LOW (ref 45.0–50.0)
pH, Ven: 7.336 — ABNORMAL HIGH (ref 7.250–7.300)
pO2, Ven: 59 mmHg — ABNORMAL HIGH (ref 30.0–45.0)

## 2015-03-08 LAB — PROTIME-INR
INR: 1.34 (ref 0.00–1.49)
Prothrombin Time: 16.7 seconds — ABNORMAL HIGH (ref 11.6–15.2)

## 2015-03-08 LAB — CBG MONITORING, ED
Glucose-Capillary: 245 mg/dL — ABNORMAL HIGH (ref 65–99)
Glucose-Capillary: 277 mg/dL — ABNORMAL HIGH (ref 65–99)

## 2015-03-08 LAB — I-STAT TROPONIN, ED: Troponin i, poc: 0.01 ng/mL (ref 0.00–0.08)

## 2015-03-08 LAB — MAGNESIUM: Magnesium: 2.2 mg/dL (ref 1.7–2.4)

## 2015-03-08 LAB — GLUCOSE, CAPILLARY: Glucose-Capillary: 289 mg/dL — ABNORMAL HIGH (ref 65–99)

## 2015-03-08 LAB — I-STAT CG4 LACTIC ACID, ED: Lactic Acid, Venous: 1.06 mmol/L (ref 0.5–2.0)

## 2015-03-08 MED ORDER — CINACALCET HCL 30 MG PO TABS
30.0000 mg | ORAL_TABLET | ORAL | Status: DC
  Administered 2015-03-09: 30 mg via ORAL
  Filled 2015-03-08: qty 1

## 2015-03-08 MED ORDER — ONDANSETRON HCL 4 MG/2ML IJ SOLN: 4.0000 mg | Freq: Four times a day (QID) | INTRAMUSCULAR | Status: DC | PRN

## 2015-03-08 MED ORDER — PREDNISOLONE ACETATE 1 % OP SUSP
1.0000 [drp] | Freq: Two times a day (BID) | OPHTHALMIC | Status: DC
  Filled 2015-03-08: qty 1

## 2015-03-08 MED ORDER — SEVELAMER CARBONATE 800 MG PO TABS
800.0000 mg | ORAL_TABLET | Freq: Three times a day (TID) | ORAL | Status: DC
  Administered 2015-03-09 – 2015-03-11 (×7): 800 mg via ORAL
  Filled 2015-03-08 (×8): qty 1

## 2015-03-08 MED ORDER — RISPERIDONE 0.5 MG PO TABS: 2.0000 mg | ORAL_TABLET | Freq: Two times a day (BID) | ORAL | Status: DC

## 2015-03-08 MED ORDER — SODIUM CHLORIDE 0.9% FLUSH: 3.0000 mL | INTRAVENOUS | Status: DC | PRN

## 2015-03-08 MED ORDER — ACETAMINOPHEN 325 MG PO TABS: 650.0000 mg | ORAL_TABLET | Freq: Four times a day (QID) | ORAL | Status: DC | PRN

## 2015-03-08 MED ORDER — ONDANSETRON HCL 4 MG PO TABS: 4.0000 mg | ORAL_TABLET | Freq: Four times a day (QID) | ORAL | Status: DC | PRN

## 2015-03-08 MED ORDER — HEPARIN SODIUM (PORCINE) 1000 UNIT/ML DIALYSIS: 40.0000 [IU]/kg | INTRAMUSCULAR | Status: DC | PRN

## 2015-03-08 MED ORDER — DEXTROSE 5 % IV SOLN: 1.0000 g | Freq: Three times a day (TID) | INTRAVENOUS | Status: DC

## 2015-03-08 MED ORDER — RENA-VITE PO TABS: 1.0000 | ORAL_TABLET | Freq: Every day | ORAL | Status: DC

## 2015-03-08 MED ORDER — SEVELAMER CARBONATE 800 MG PO TABS: 800.0000 mg | ORAL_TABLET | Freq: Three times a day (TID) | ORAL | Status: DC

## 2015-03-08 MED ORDER — ALTEPLASE 2 MG IJ SOLR: 2.0000 mg | Freq: Once | INTRAMUSCULAR | Status: DC | PRN

## 2015-03-08 MED ORDER — PREDNISOLONE ACETATE 1 % OP SUSP
1.0000 [drp] | Freq: Two times a day (BID) | OPHTHALMIC | Status: DC
  Administered 2015-03-08 – 2015-03-11 (×5): 1 [drp] via OPHTHALMIC
  Filled 2015-03-08: qty 1

## 2015-03-08 MED ORDER — PANTOPRAZOLE SODIUM 40 MG PO TBEC
40.0000 mg | DELAYED_RELEASE_TABLET | Freq: Every day | ORAL | Status: DC
  Administered 2015-03-09 – 2015-03-11 (×3): 40 mg via ORAL
  Filled 2015-03-08 (×3): qty 1

## 2015-03-08 MED ORDER — VANCOMYCIN HCL IN DEXTROSE 750-5 MG/150ML-% IV SOLN
750.0000 mg | INTRAVENOUS | Status: AC
  Administered 2015-03-08: 750 mg via INTRAVENOUS
  Filled 2015-03-08: qty 150

## 2015-03-08 MED ORDER — CLONIDINE HCL 0.1 MG PO TABS: 0.1000 mg | ORAL_TABLET | Freq: Three times a day (TID) | ORAL | Status: DC

## 2015-03-08 MED ORDER — GI COCKTAIL ~~LOC~~: 30.0000 mL | Freq: Two times a day (BID) | ORAL | Status: DC | PRN

## 2015-03-08 MED ORDER — HEPARIN SODIUM (PORCINE) 1000 UNIT/ML DIALYSIS: 1000.0000 [IU] | INTRAMUSCULAR | Status: DC | PRN

## 2015-03-08 MED ORDER — VANCOMYCIN HCL IN DEXTROSE 750-5 MG/150ML-% IV SOLN
750.0000 mg | INTRAVENOUS | Status: DC
  Administered 2015-03-10: 750 mg via INTRAVENOUS
  Filled 2015-03-08: qty 150

## 2015-03-08 MED ORDER — AMLODIPINE BESYLATE 10 MG PO TABS: 10.0000 mg | ORAL_TABLET | Freq: Every day | ORAL | Status: DC

## 2015-03-08 MED ORDER — DEXTROSE 5 % IV SOLN
2.0000 g | Freq: Once | INTRAVENOUS | Status: AC
  Administered 2015-03-08: 2 g via INTRAVENOUS
  Filled 2015-03-08: qty 2

## 2015-03-08 MED ORDER — PENTAFLUOROPROP-TETRAFLUOROETH EX AERO: 1.0000 "application " | INHALATION_SPRAY | CUTANEOUS | Status: DC | PRN

## 2015-03-08 MED ORDER — BRIMONIDINE TARTRATE 0.15 % OP SOLN
1.0000 [drp] | Freq: Three times a day (TID) | OPHTHALMIC | Status: DC
  Administered 2015-03-08 – 2015-03-11 (×5): 1 [drp] via OPHTHALMIC
  Filled 2015-03-08: qty 5

## 2015-03-08 MED ORDER — LIDOCAINE-PRILOCAINE 2.5-2.5 % EX CREA: 1.0000 "application " | TOPICAL_CREAM | CUTANEOUS | Status: DC | PRN

## 2015-03-08 MED ORDER — LORAZEPAM 1 MG PO TABS
1.0000 mg | ORAL_TABLET | Freq: Four times a day (QID) | ORAL | Status: DC | PRN
  Administered 2015-03-10: 1 mg via ORAL
  Filled 2015-03-08: qty 1

## 2015-03-08 MED ORDER — ALBUTEROL SULFATE (2.5 MG/3ML) 0.083% IN NEBU: 2.5000 mg | INHALATION_SOLUTION | Freq: Four times a day (QID) | RESPIRATORY_TRACT | Status: DC

## 2015-03-08 MED ORDER — SENNOSIDES-DOCUSATE SODIUM 8.6-50 MG PO TABS: 1.0000 | ORAL_TABLET | Freq: Two times a day (BID) | ORAL | Status: DC

## 2015-03-08 MED ORDER — BISACODYL 10 MG RE SUPP: 10.0000 mg | Freq: Every day | RECTAL | Status: DC | PRN

## 2015-03-08 MED ORDER — SENNOSIDES-DOCUSATE SODIUM 8.6-50 MG PO TABS
1.0000 | ORAL_TABLET | Freq: Two times a day (BID) | ORAL | Status: DC
  Administered 2015-03-08 – 2015-03-11 (×5): 1 via ORAL
  Filled 2015-03-08 (×6): qty 1

## 2015-03-08 MED ORDER — SODIUM CHLORIDE 0.9 % IV SOLN: 100.0000 mL | INTRAVENOUS | Status: DC | PRN

## 2015-03-08 MED ORDER — HEPARIN SODIUM (PORCINE) 5000 UNIT/ML IJ SOLN
5000.0000 [IU] | Freq: Three times a day (TID) | INTRAMUSCULAR | Status: DC
  Administered 2015-03-08 – 2015-03-11 (×8): 5000 [IU] via SUBCUTANEOUS
  Filled 2015-03-08 (×8): qty 1

## 2015-03-08 MED ORDER — PREDNISOLONE ACETATE 1 % OP SUSP: 1.0000 [drp] | Freq: Two times a day (BID) | OPHTHALMIC | Status: DC

## 2015-03-08 MED ORDER — ALBUTEROL SULFATE (2.5 MG/3ML) 0.083% IN NEBU
2.5000 mg | INHALATION_SOLUTION | Freq: Four times a day (QID) | RESPIRATORY_TRACT | Status: DC
  Administered 2015-03-08: 2.5 mg via RESPIRATORY_TRACT
  Filled 2015-03-08: qty 3

## 2015-03-08 MED ORDER — BRIMONIDINE TARTRATE 0.15 % OP SOLN: 1.0000 [drp] | Freq: Three times a day (TID) | OPHTHALMIC | Status: DC

## 2015-03-08 MED ORDER — HYDROCODONE-ACETAMINOPHEN 5-325 MG PO TABS: 1.0000 | ORAL_TABLET | ORAL | Status: DC | PRN

## 2015-03-08 MED ORDER — INSULIN ASPART 100 UNIT/ML ~~LOC~~ SOLN
0.0000 [IU] | Freq: Every day | SUBCUTANEOUS | Status: DC
  Administered 2015-03-08 – 2015-03-10 (×3): 3 [IU] via SUBCUTANEOUS

## 2015-03-08 MED ORDER — ATROPINE SULFATE 1 % OP SOLN
1.0000 [drp] | Freq: Every day | OPHTHALMIC | Status: DC
  Administered 2015-03-09 – 2015-03-11 (×2): 1 [drp] via OPHTHALMIC
  Filled 2015-03-08: qty 2

## 2015-03-08 MED ORDER — DEXTROSE 5 % IV SOLN
2.0000 g | INTRAVENOUS | Status: DC
  Administered 2015-03-10: 2 g via INTRAVENOUS
  Filled 2015-03-08: qty 2

## 2015-03-08 MED ORDER — RENA-VITE PO TABS
1.0000 | ORAL_TABLET | Freq: Every day | ORAL | Status: DC
  Administered 2015-03-09 – 2015-03-10 (×2): 1 via ORAL
  Filled 2015-03-08 (×2): qty 1

## 2015-03-08 MED ORDER — SODIUM CHLORIDE 0.9 % IV BOLUS (SEPSIS): 500.0000 mL | Freq: Once | INTRAVENOUS | Status: DC

## 2015-03-08 MED ORDER — ACETAMINOPHEN 650 MG RE SUPP: 650.0000 mg | Freq: Four times a day (QID) | RECTAL | Status: DC | PRN

## 2015-03-08 MED ORDER — MORPHINE SULFATE (PF) 2 MG/ML IV SOLN
1.0000 mg | INTRAVENOUS | Status: DC | PRN
  Administered 2015-03-08 – 2015-03-09 (×2): 1 mg via INTRAVENOUS
  Filled 2015-03-08 (×2): qty 1

## 2015-03-08 MED ORDER — DEXTROSE 5 % IV SOLN
1.0000 g | INTRAVENOUS | Status: AC
  Administered 2015-03-08: 1 g via INTRAVENOUS
  Filled 2015-03-08: qty 1

## 2015-03-08 MED ORDER — SODIUM CHLORIDE 0.9 % IV SOLN
1500.0000 mg | Freq: Once | INTRAVENOUS | Status: AC
  Administered 2015-03-08: 1500 mg via INTRAVENOUS
  Filled 2015-03-08 (×2): qty 1500

## 2015-03-08 MED ORDER — ALBUTEROL SULFATE (2.5 MG/3ML) 0.083% IN NEBU
2.5000 mg | INHALATION_SOLUTION | Freq: Three times a day (TID) | RESPIRATORY_TRACT | Status: DC
  Administered 2015-03-09 (×2): 2.5 mg via RESPIRATORY_TRACT
  Filled 2015-03-08 (×4): qty 3

## 2015-03-08 MED ORDER — INSULIN GLARGINE 100 UNIT/ML ~~LOC~~ SOLN
6.0000 [IU] | Freq: Every day | SUBCUTANEOUS | Status: DC
  Administered 2015-03-08 – 2015-03-09 (×2): 6 [IU] via SUBCUTANEOUS
  Filled 2015-03-08 (×2): qty 0.06

## 2015-03-08 MED ORDER — LIDOCAINE HCL (PF) 1 % IJ SOLN: 5.0000 mL | INTRAMUSCULAR | Status: DC | PRN

## 2015-03-08 MED ORDER — SIMVASTATIN 20 MG PO TABS
20.0000 mg | ORAL_TABLET | Freq: Every day | ORAL | Status: DC
  Administered 2015-03-08 – 2015-03-10 (×3): 20 mg via ORAL
  Filled 2015-03-08 (×3): qty 1

## 2015-03-08 MED ORDER — TEMAZEPAM 15 MG PO CAPS
15.0000 mg | ORAL_CAPSULE | Freq: Every day | ORAL | Status: DC
  Administered 2015-03-08 – 2015-03-10 (×3): 15 mg via ORAL
  Filled 2015-03-08 (×3): qty 1

## 2015-03-08 MED ORDER — SODIUM CHLORIDE 0.9% FLUSH
3.0000 mL | Freq: Two times a day (BID) | INTRAVENOUS | Status: DC
  Administered 2015-03-08 – 2015-03-11 (×5): 3 mL via INTRAVENOUS

## 2015-03-08 MED ORDER — INSULIN ASPART 100 UNIT/ML ~~LOC~~ SOLN
0.0000 [IU] | Freq: Three times a day (TID) | SUBCUTANEOUS | Status: DC
  Administered 2015-03-09 (×2): 3 [IU] via SUBCUTANEOUS
  Administered 2015-03-09: 5 [IU] via SUBCUTANEOUS
  Administered 2015-03-10: 1 [IU] via SUBCUTANEOUS
  Administered 2015-03-10: 2 [IU] via SUBCUTANEOUS
  Administered 2015-03-10 – 2015-03-11 (×3): 5 [IU] via SUBCUTANEOUS

## 2015-03-08 MED ORDER — AMLODIPINE BESYLATE 10 MG PO TABS
10.0000 mg | ORAL_TABLET | Freq: Every day | ORAL | Status: DC
  Administered 2015-03-09 – 2015-03-10 (×2): 10 mg via ORAL
  Filled 2015-03-08 (×2): qty 1

## 2015-03-08 MED ORDER — CLONIDINE HCL 0.1 MG PO TABS
0.1000 mg | ORAL_TABLET | Freq: Three times a day (TID) | ORAL | Status: DC
  Administered 2015-03-08 – 2015-03-11 (×6): 0.1 mg via ORAL
  Filled 2015-03-08 (×5): qty 1

## 2015-03-08 MED ORDER — RISPERIDONE 2 MG PO TABS
2.0000 mg | ORAL_TABLET | Freq: Two times a day (BID) | ORAL | Status: DC
  Administered 2015-03-08 – 2015-03-11 (×5): 2 mg via ORAL
  Filled 2015-03-08: qty 4
  Filled 2015-03-08: qty 1
  Filled 2015-03-08: qty 4
  Filled 2015-03-08: qty 1
  Filled 2015-03-08: qty 4
  Filled 2015-03-08 (×3): qty 1

## 2015-03-08 MED ORDER — ATROPINE SULFATE 1 % OP SOLN: 1.0000 [drp] | Freq: Every day | OPHTHALMIC | Status: DC

## 2015-03-08 MED ORDER — LABETALOL HCL 100 MG PO TABS
100.0000 mg | ORAL_TABLET | Freq: Two times a day (BID) | ORAL | Status: DC
  Administered 2015-03-08 – 2015-03-11 (×6): 100 mg via ORAL
  Filled 2015-03-08 (×6): qty 1

## 2015-03-08 MED ORDER — SODIUM CHLORIDE 0.9 % IV SOLN: 250.0000 mL | INTRAVENOUS | Status: DC | PRN

## 2015-03-08 NOTE — Progress Notes (Addendum)
ANTIBIOTIC CONSULT NOTE - INITIAL  Pharmacy Consult for vancomycin and cefepime Indication: rule out sepsis  Allergies  Allergen Reactions  . Lactose Intolerance (Gi) Diarrhea  . Polymyxin B Swelling    "Can't take this eye drop," per outside records.   . Sulfamethoxazole Rash    "Swelling of area, face, from drop that had sulfa," per outside records.    Vital Signs: Temp: 99.4 F (37.4 C) (01/30 0954) Temp Source: Rectal (01/30 0954) BP: 148/91 mmHg (01/30 1015) Pulse Rate: 93 (01/30 1015) Intake/Output from previous day:   Intake/Output from this shift:    Labs:  Recent Labs  03/08/15 1028  HGB 11.9*  CREATININE 6.50*   CrCl cannot be calculated (Unknown ideal weight.). No results for input(s): VANCOTROUGH, VANCOPEAK, VANCORANDOM, GENTTROUGH, GENTPEAK, GENTRANDOM, TOBRATROUGH, TOBRAPEAK, TOBRARND, AMIKACINPEAK, AMIKACINTROU, AMIKACIN in the last 72 hours.   Assessment: 45 yo male presenting with CP, SOB. Pt hypoxic, febrile on arrival. Recent admission with PNA d/c on augmentin  PMH: ESRD on HD MWF  ID: abx for PNA. Tmax 102, Lact 1.06  Vancomycin 1/30>> Cefepime 1/30>>  Blood x 2 Urine  Renal: SCr 6.5, Plan for HD today  Goal of Therapy:  Pre-HD level 15 - 25 mcg/mL  Plan:  Cefepime 2 g iv x 1. Will give additional 1 gm after HD, then schedule 2 gm with HD Vancomycin 1500 mg x 1, Will give addition 750 mg after HD then schedule 750 mg with HD sessions Follow HD schedule Monitor clinical status, culture, VT prn  Levester Fresh, PharmD, BCPS, [facility] Clinical Pharmacist Pager [phone] 03/08/2015 10:34 AM

## 2015-03-08 NOTE — Progress Notes (Signed)
Dialysis treatment completed.  2500 mL ultrafiltrated and net fluid removal 2000 mL.    Patient status unchanged. Lung sounds diminished to ausculation in all fields. BLE moderate edema. Cardiac: ST.  Disconnected lines and removed needles.  Pressure held for 10 minutes and band aid/gauze dressing applied.  Report given to bedside RN, Estill Bamberg.

## 2015-03-08 NOTE — ED Notes (Signed)
Activated code sepsis  

## 2015-03-08 NOTE — ED Notes (Signed)
This nurse in room to feed PT lunch

## 2015-03-08 NOTE — ED Notes (Signed)
Dialysis estimates they can take PT in 30-45 minutes and will call for report

## 2015-03-08 NOTE — ED Notes (Addendum)
PT rates chest pain 8/10 at this time. EDP notified at this time

## 2015-03-08 NOTE — ED Notes (Signed)
PT ate an applesauce, [place] sandwich, and crackers with PB with full assitance. No problems swallowing

## 2015-03-08 NOTE — ED Notes (Signed)
[facility]- pt coming from home with c/o chest pain. Pt is MWF dialysis patient. He c/o chest pain and shortness of breath to caregiver. Hypoxic at 85% with EMS on RA, placed on 4L nasal cannula and O2 up to 98%. Pt also febrile at 102 with EMS, given 1000mg  of tylenol.

## 2015-03-08 NOTE — ED Notes (Signed)
MD at bedside. 

## 2015-03-08 NOTE — ED Provider Notes (Signed)
CSN: [id number]     Arrival date & time 03/08/15  [id number] History   First MD Initiated Contact with Patient 03/08/15 [phone]     Chief Complaint  Patient presents with  . Chest Pain     (Consider location/radiation/quality/duration/timing/severity/associated sxs/prior Treatment) Patient is a 45 y.o. male presenting with chest pain. The history is provided by a caregiver. The history is limited by the condition of the patient.  Chest Pain Pain location:  Substernal area Pain quality: sharp   Pain radiates to:  Does not radiate Pain severity:  Mild Onset quality:  Sudden Duration: started after a fall off his bed today. Timing:  Constant Progression:  Unchanged Chronicity:  New Context: at rest and trauma   Relieved by:  None tried Worsened by:  Nothing tried Ineffective treatments:  None tried Associated symptoms: fever and shortness of breath   Associated symptoms: no abdominal pain, no nausea and not vomiting   Associated symptoms comment:  AMS: more sleepy than usual.  Risk factors: diabetes mellitus, hypertension and male sex     Past Medical History  Diagnosis Date  . Diabetes mellitus   . Hyperthyroidism   . Quadriparesis (muscle weakness) ([place])   . Constipation   . Schizo-affective schizophrenia ([place])   . Hypertension   . Depression   . ESRD (end stage renal disease) on dialysis [facility])     "[place] off 68; MWF" (02/25/2015)  . HCAP (healthcare-associated pneumonia)   . Acute respiratory failure with hypoxia ([place])   . Anemia   . Chest pain   . Hyperthyroidism    Past Surgical History  Procedure Laterality Date  . Dialysis fistula creation    . Multiple extractions with alveoloplasty  01/17/2011    Procedure: MULTIPLE EXTRACION WITH ALVEOLOPLASTY;  Surgeon: Gae Bon;  Location: [facility] OR;  Service: Oral Surgery;  Laterality: Bilateral;  Extraction of number one, sixteen, seventeen, and thirty two.    Family History  Problem Relation Age of Onset  .  Family history unknown: Yes   Social History  Substance Use Topics  . Smoking status: Never Smoker   . Smokeless tobacco: None  . Alcohol Use: No    Review of Systems  Unable to perform ROS: Mental status change  Constitutional: Positive for fever.  Respiratory: Positive for shortness of breath.   Cardiovascular: Positive for chest pain.  Gastrointestinal: Negative for nausea, vomiting, abdominal pain and diarrhea.  Skin: Negative for pallor, rash and wound.  Neurological: Negative.       Allergies  Lactose intolerance (gi); Polymyxin b; and Sulfamethoxazole  Home Medications   Prior to Admission medications   Medication Sig Start Date End Date Taking? Authorizing Provider  amLODipine (NORVASC) 10 MG tablet Take 1 tablet (10 mg total) by mouth at bedtime. 02/27/15  Yes Kinnie Feil, MD  atropine 1 % ophthalmic solution Place 1 drop into the left eye daily.  07/16/14  Yes Historical Provider, MD  bisacodyl (DULCOLAX) 10 MG suppository Place 1 suppository (10 mg total) rectally daily as needed for moderate constipation. 02/27/15  Yes Kinnie Feil, MD  brimonidine (ALPHAGAN P) 0.1 % SOLN Place 1 drop into the left eye 3 (three) times daily.   Yes Historical Provider, MD  ciclopirox (PENLAC) 8 % solution Apply 1 application topically at bedtime. Apply over nail and surrounding skin. Apply daily over previous coat. After seven (7) days, may remove with alcohol and continue cycle.   Yes Historical Provider, MD  cinacalcet [facility])  30 MG tablet Take 30 mg by mouth See admin instructions. Tuesday, Saturday   Yes Historical Provider, MD  cloNIDine (CATAPRES) 0.1 MG tablet Take 1 tablet (0.1 mg total) by mouth 3 (three) times daily. 02/27/15  Yes Kinnie Feil, MD  hydrALAZINE (APRESOLINE) 100 MG tablet Take 1 tablet (100 mg total) by mouth 3 (three) times daily. 02/27/15  Yes Kinnie Feil, MD  insulin glargine (LANTUS) 100 UNIT/ML injection Inject 10 Units into the skin daily.     Yes Historical Provider, MD  insulin lispro (HUMALOG) 100 UNIT/ML injection Inject 5 Units into the skin 3 (three) times daily before meals. Inject additional units for blood sugar levels that are 301-350=6 units, 351-400=8 units.   Yes Historical Provider, MD  ketoconazole (NIZORAL) 2 % cream Apply 1 application topically 2 (two) times daily.   Yes Historical Provider, MD  labetalol (NORMODYNE) 100 MG tablet Take 1 tablet (100 mg total) by mouth 2 (two) times daily. 02/27/15  Yes Kinnie Feil, MD  LORazepam (ATIVAN) 1 MG tablet Take 1 mg by mouth every 8 (eight) hours as needed. For anxiety    Yes Historical Provider, MD  multivitamin (RENA-VIT) TABS tablet Take 1 tablet by mouth daily.     Yes Historical Provider, MD  nitroGLYCERIN (NITROSTAT) 0.4 MG SL tablet Place 0.4 mg under the tongue every 5 (five) minutes as needed. For chest pain    Yes Historical Provider, MD  pantoprazole (PROTONIX) 40 MG tablet Take 40 mg by mouth daily.     Yes Historical Provider, MD  prednisoLONE acetate (PRED FORTE) 1 % ophthalmic suspension Place 1 drop into the left eye 2 (two) times daily. 10/20/14  Yes Historical Provider, MD  risperiDONE (RISPERDAL) 2 MG tablet Take 2 mg by mouth 2 (two) times daily.     Yes Historical Provider, MD  senna-docusate (SENOKOT-S) 8.6-50 MG tablet Take 1 tablet by mouth 2 (two) times daily. 02/27/15  Yes Kinnie Feil, MD  sevelamer (RENVELA) 800 MG tablet Take 800 mg by mouth 3 (three) times daily.     Yes Historical Provider, MD  simvastatin (ZOCOR) 20 MG tablet Take 20 mg by mouth at bedtime.     Yes Historical Provider, MD  temazepam (RESTORIL) 15 MG capsule Take 15 mg by mouth at bedtime.     Yes Historical Provider, MD  amoxicillin-clavulanate (AUGMENTIN) 250-125 MG tablet Take 1 tablet by mouth daily. 02/27/15   Kinnie Feil, MD   BP 154/85 mmHg  Pulse 93  Temp(Src) 98.7 F (37.1 C) (Oral)  Resp 29  Wt 70.308 kg  SpO2 93% Physical Exam  Constitutional: He  appears lethargic. He is cooperative. He has a sickly appearance. Face mask in place.  HENT:  Head: Normocephalic and atraumatic.  Eyes: Pupils are equal, round, and reactive to light.  Neck: Normal range of motion.  Cardiovascular: Normal rate, regular rhythm, normal heart sounds and intact distal pulses.   Pulmonary/Chest: Tachypnea noted. No respiratory distress. He has decreased breath sounds. He has rhonchi.  Abdominal: Soft. He exhibits no distension. There is no tenderness. There is no rebound.  Musculoskeletal:  Right leg mildly edematous compared to left with purplish discoloration  Neurological: He appears lethargic. He is disoriented. He displays atrophy (to b/l LE). No cranial nerve deficit. GCS eye subscore is 3. GCS verbal subscore is 4. GCS motor subscore is 6.  Skin: Skin is warm and dry.  Nursing note and vitals reviewed.   ED Course  Procedures (  including critical care time) Labs Review Labs Reviewed  COMPREHENSIVE METABOLIC PANEL - Abnormal; Notable for the following:    Potassium 5.4 (*)    CO2 20 (*)    Glucose, Bld 242 (*)    BUN 66 (*)    Creatinine, Ser 6.99 (*)    Albumin 3.4 (*)    ALT 15 (*)    Alkaline Phosphatase 155 (*)    GFR calc non Af Amer 9 (*)    GFR calc Af Amer 10 (*)    Anion gap 17 (*)    All other components within normal limits  CBC WITH DIFFERENTIAL/PLATELET - Abnormal; Notable for the following:    WBC 16.7 (*)    RBC 3.17 (*)    Hemoglobin 10.3 (*)    HCT 32.0 (*)    MCV 100.9 (*)    RDW 19.1 (*)    Platelets 130 (*)    Neutro Abs 14.8 (*)    All other components within normal limits  PROTIME-INR - Abnormal; Notable for the following:    Prothrombin Time 16.7 (*)    All other components within normal limits  I-STAT CHEM 8, ED - Abnormal; Notable for the following:    Potassium 5.2 (*)    BUN 59 (*)    Creatinine, Ser 6.50 (*)    Glucose, Bld 244 (*)    Hemoglobin 11.9 (*)    HCT 35.0 (*)    All other components within  normal limits  I-STAT VENOUS BLOOD GAS, ED - Abnormal; Notable for the following:    pH, Ven 7.336 (*)    pCO2, Ven 38.2 (*)    pO2, Ven 59.0 (*)    Acid-base deficit 5.0 (*)    All other components within normal limits  CBG MONITORING, ED - Abnormal; Notable for the following:    Glucose-Capillary 245 (*)    All other components within normal limits  CBG MONITORING, ED - Abnormal; Notable for the following:    Glucose-Capillary 277 (*)    All other components within normal limits  CULTURE, BLOOD (ROUTINE X 2)  CULTURE, BLOOD (ROUTINE X 2)  URINE CULTURE  CULTURE, BLOOD (ROUTINE X 2)  CULTURE, BLOOD (ROUTINE X 2)  CULTURE, EXPECTORATED SPUTUM-ASSESSMENT  GRAM STAIN  MAGNESIUM  TROPONIN I  URINALYSIS, ROUTINE W REFLEX MICROSCOPIC (NOT AT [facility])  HIV ANTIBODY (ROUTINE TESTING)  STREP PNEUMONIAE URINARY ANTIGEN  TROPONIN I  I-STAT CG4 LACTIC ACID, ED  I-STAT TROPOININ, ED  I-STAT VENOUS BLOOD GAS, ED  I-STAT CG4 LACTIC ACID, ED    Imaging Review  I have personally reviewed and evaluated these images and lab results as part of my medical decision-making.   EKG Interpretation   Date/Time:  Monday March 08 2015 09:51:34 EST Ventricular Rate:  95 PR Interval:  178 QRS Duration: 91 QT Interval:  351 QTC Calculation: 441 R Axis:   79 Text Interpretation:  Sinus rhythm Confirmed by Hazle Coca [id number]) on  03/08/2015 10:03:58 AM      MDM   Final diagnoses:  Acute respiratory failure with hypoxia ([place])  HCAP (healthcare-associated pneumonia)  ESRD (end stage renal disease) on dialysis [facility])    Patient is a 45 year old male end-stage renal disease who presents with chest pain after a unwitnessed fall out of bed last night. He has otherwise been in his usual state of health. He was found to be hypoxic with O2 sats in the 80s requiring nonrebreather and febrile with EMS and was given Tylenol. Patient's  chest pain is reproducible on exam. He does appear lethargic and  requiring nonrebreather despite multiple trials to be weaned to nasal cannula. Patient found to have left upper lobe pneumonia with leukocytosis. Given recent hospitalization for pneumonia as well as hemodialysis patient will be covered for HCAP. Will start with small boluses of IVF given hypertension and ESRD. He will be admitted to medicine for further management and evaluation.     Heriberto Antigua, MD 03/08/15 Dionicia Abler  Quintella Reichert, MD 03/09/15 0800

## 2015-03-08 NOTE — ED Notes (Signed)
Attempted to wean pt to 4L [place], pt O2 saturation at 86% on 4L [place], resp therapist will place on venturi mask.

## 2015-03-08 NOTE — Progress Notes (Signed)
Pharmacy Code Sepsis Protocol  Time of code sepsis page: [id number] [x]  Antibiotics delivered at 1014  Were antibiotics ordered at the time of the code sepsis page? Yes Was it required to contact the physician? []  Physician not contacted []  Physician contacted to order antibiotics for code sepsis []  Physician contacted to recommend changing antibiotics  Pharmacy consulted for: cefepime and vancomycin  Anti-infectives    Start     Dose/Rate Route Frequency Ordered Stop   03/08/15 1000  ceFEPIme (MAXIPIME) 2 g in dextrose 5 % 50 mL IVPB     2 g 100 mL/hr over 30 Minutes Intravenous  Once 03/08/15 0955     03/08/15 1000  vancomycin (VANCOCIN) 1,500 mg in sodium chloride 0.9 % 500 mL IVPB     1,500 mg 250 mL/hr over 120 Minutes Intravenous  Once 03/08/15 [id number]          Nurse education provided: []  Minutes left to administer antibiotics to achieve 1 hour goal []  Correct order of antibiotic administration []  Antibiotic Y-site compatibilities    Levester Fresh, PharmD, BCPS, [facility] Clinical Pharmacist Pager [phone] 03/08/2015 10:16 AM

## 2015-03-08 NOTE — ED Notes (Signed)
Anjane, RN in dialysis accepts report at this time and reports PT may come to [facility]

## 2015-03-08 NOTE — ED Notes (Signed)
Admitting MD at bedside.

## 2015-03-08 NOTE — H&P (Signed)
[facility] History and Physical  [name] [id number] DOB: 1970/05/14 DOA: 03/08/2015  Referring physician: Lanetta Inch PCP: Maggie Font, MD   Chief Complaint: sob/chest pain  HPI: [name] is a 45 y.o. male with past medical history that includes end-stage renal disease a Monday Wednesday Friday dialysis patient insulin-dependent diabetes, hypothyroidism, or paresis and schizophrenia presents to the emergency department with the chief complaint of shortness of breath and chest pain. Initial evaluation in the emergency department reveals acute respiratory failure, chest x-ray concerning for pneumonia.  Information is obtained from the chart and the patient. Of note information from patient may be somewhat unreliable. Reports worsening shortness of breath intermittent cough for the last couple of days. Associated symptoms include left anterior nonradiating chest pain. He describes the pain as sharp intermittent worse with coughing.  EMS was called to the home according to the records he was found to be hypoxic with an oxygen saturation level of 85% on room air and temperature of 102. He denies headache dizziness abdominal pain nausea vomiting diarrhea. He does confess to chronic constipation.  In the emergency department he has temp 99.4 is hemodynamically stable and hypoxic with an oxygen saturation level 82% on room air.  provided with oxygen supplementation nonrebreather mask and Tylenol.  Review of Systems:  Ten point review of systems complete and all systems are negative except as indicated in the history of present illness   Past Medical History  Diagnosis Date  . Diabetes mellitus   . Hyperthyroidism   . Quadriparesis (muscle weakness) ([place])   . Constipation   . Schizo-affective schizophrenia ([place])   . Hypertension   . Depression   . ESRD (end stage renal disease) on dialysis [facility])     "[place] off 68; MWF" (02/25/2015)   Past Surgical  History  Procedure Laterality Date  . Dialysis fistula creation    . Multiple extractions with alveoloplasty  01/17/2011    Procedure: MULTIPLE EXTRACION WITH ALVEOLOPLASTY;  Surgeon: Gae Bon;  Location: [facility] OR;  Service: Oral Surgery;  Laterality: Bilateral;  Extraction of number one, sixteen, seventeen, and thirty two.    Social History:  reports that he has never smoked. He does not have any smokeless tobacco history on file. He reports that he does not drink alcohol or use illicit drugs.  Allergies  Allergen Reactions  . Lactose Intolerance (Gi) Diarrhea  . Polymyxin B Swelling    "Can't take this eye drop," per outside records.   . Sulfamethoxazole Rash    "Swelling of area, face, from drop that had sulfa," per outside records.     Family History  Problem Relation Age of Onset  . Family history unknown: Yes     Prior to Admission medications   Medication Sig Start Date End Date Taking? Authorizing Provider  amLODipine (NORVASC) 10 MG tablet Take 1 tablet (10 mg total) by mouth at bedtime. 02/27/15  Yes Kinnie Feil, MD  atropine 1 % ophthalmic solution Place 1 drop into the left eye daily.  07/16/14  Yes Historical Provider, MD  bisacodyl (DULCOLAX) 10 MG suppository Place 1 suppository (10 mg total) rectally daily as needed for moderate constipation. 02/27/15  Yes Kinnie Feil, MD  brimonidine (ALPHAGAN P) 0.1 % SOLN Place 1 drop into the left eye 3 (three) times daily.   Yes Historical Provider, MD  ciclopirox (PENLAC) 8 % solution Apply 1 application topically at bedtime. Apply over nail and surrounding skin. Apply daily over  previous coat. After seven (7) days, may remove with alcohol and continue cycle.   Yes Historical Provider, MD  cinacalcet (SENSIPAR) 30 MG tablet Take 30 mg by mouth See admin instructions. Tuesday, Saturday   Yes Historical Provider, MD  cloNIDine (CATAPRES) 0.1 MG tablet Take 1 tablet (0.1 mg total) by mouth 3 (three) times daily. 02/27/15  Yes  Kinnie Feil, MD  hydrALAZINE (APRESOLINE) 100 MG tablet Take 1 tablet (100 mg total) by mouth 3 (three) times daily. 02/27/15  Yes Kinnie Feil, MD  insulin glargine (LANTUS) 100 UNIT/ML injection Inject 10 Units into the skin daily.    Yes Historical Provider, MD  insulin lispro (HUMALOG) 100 UNIT/ML injection Inject 5 Units into the skin 3 (three) times daily before meals. Inject additional units for blood sugar levels that are 301-350=6 units, 351-400=8 units.   Yes Historical Provider, MD  ketoconazole (NIZORAL) 2 % cream Apply 1 application topically 2 (two) times daily.   Yes Historical Provider, MD  labetalol (NORMODYNE) 100 MG tablet Take 1 tablet (100 mg total) by mouth 2 (two) times daily. 02/27/15  Yes Kinnie Feil, MD  LORazepam (ATIVAN) 1 MG tablet Take 1 mg by mouth every 8 (eight) hours as needed. For anxiety    Yes Historical Provider, MD  multivitamin (RENA-VIT) TABS tablet Take 1 tablet by mouth daily.     Yes Historical Provider, MD  nitroGLYCERIN (NITROSTAT) 0.4 MG SL tablet Place 0.4 mg under the tongue every 5 (five) minutes as needed. For chest pain    Yes Historical Provider, MD  pantoprazole (PROTONIX) 40 MG tablet Take 40 mg by mouth daily.     Yes Historical Provider, MD  prednisoLONE acetate (PRED FORTE) 1 % ophthalmic suspension Place 1 drop into the left eye 2 (two) times daily. 10/20/14  Yes Historical Provider, MD  risperiDONE (RISPERDAL) 2 MG tablet Take 2 mg by mouth 2 (two) times daily.     Yes Historical Provider, MD  senna-docusate (SENOKOT-S) 8.6-50 MG tablet Take 1 tablet by mouth 2 (two) times daily. 02/27/15  Yes Kinnie Feil, MD  sevelamer (RENVELA) 800 MG tablet Take 800 mg by mouth 3 (three) times daily.     Yes Historical Provider, MD  simvastatin (ZOCOR) 20 MG tablet Take 20 mg by mouth at bedtime.     Yes Historical Provider, MD  temazepam (RESTORIL) 15 MG capsule Take 15 mg by mouth at bedtime.     Yes Historical Provider, MD    amoxicillin-clavulanate (AUGMENTIN) 250-125 MG tablet Take 1 tablet by mouth daily. 02/27/15   Kinnie Feil, MD   Physical Exam: Filed Vitals:   03/08/15 1200 03/08/15 1205 03/08/15 1215 03/08/15 1230  BP: 122/79  111/77 126/78  Pulse: 91  91 90  Temp:      TempSrc:      Resp: 25  21 22   Weight:      SpO2: 87% 92% 96% 96%    Wt Readings from Last 3 Encounters:  03/08/15 70.308 kg (155 lb)  02/26/15 73.3 kg (161 lb 9.6 oz)  01/17/11 74 kg (163 lb 2.3 oz)    General:  Appears calm and comfortable Eyes: PERRL, normal lids, irises & conjunctiva gazing upward ENT: grossly normal hearing, lips & tongue Neck: no LAD, masses or thyromegaly Cardiovascular: RRR, no m/r/g. trrace LE edema. Respiratory:  Normal respiratory effort somewhat shallow. Breath sounds diminished throughout. Scattered rhonchi Abdomen: soft, ntnd positive bowel sounds Skin: no rash or induration seen on  limited exam Musculoskeletal: Poor muscle tone. Bilateral flexion contractures UE, right foot mild edema, joints without swelling/erythema Psychiatric: grossly normal mood and affect, speech fluent and appropriate Neurologic: grossly non-focal. Speech low but clear oriented to self and place, attempts to follow commands          Labs on Admission:  Basic Metabolic Panel:  Recent Labs Lab 03/08/15 1014 03/08/15 1028  NA 140 139  K 5.4* 5.2*  CL 103 106  CO2 20*  --   GLUCOSE 242* 244*  BUN 66* 59*  CREATININE 6.99* 6.50*  CALCIUM 9.3  --    Liver Function Tests:  Recent Labs Lab 03/08/15 1014  AST 30  ALT 15*  ALKPHOS 155*  BILITOT 1.0  PROT 7.2  ALBUMIN 3.4*   No results for input(s): LIPASE, AMYLASE in the last 168 hours. No results for input(s): AMMONIA in the last 168 hours. CBC:  Recent Labs Lab 03/08/15 1014 03/08/15 1028  WBC 16.7*  --   NEUTROABS 14.8*  --   HGB 10.3* 11.9*  HCT 32.0* 35.0*  MCV 100.9*  --   PLT 130*  --    Cardiac Enzymes: No results for input(s):  CKTOTAL, CKMB, CKMBINDEX, TROPONINI in the last 168 hours.  BNP (last 3 results)  Recent Labs  02/24/15 2013  BNP 542.5*    ProBNP (last 3 results) No results for input(s): PROBNP in the last 8760 hours.  CBG:  Recent Labs Lab 03/08/15 1049  GLUCAP 245*    Radiological Exams on Admission:New left upper lobe pneumonia. 2. Small effusions and bibasilar airspace disease. While this may reflect atelectasis, infection is not excluded.   EKG: Independently reviewed sinus rhythm  Assessment/Plan Principal Problem:   Acute respiratory failure with hypoxia (H[facility]) Active Problems:   Chronic osteomyelitis (HCC)   ESRD (end stage renal disease) on dialysis ([facility])   Hypertension   Thrombocytopenia (H[facility])   Hyperthyroidism   Schizophrenia ([place])   HCAP (healthcare-associated pneumonia)   Chest pain  #1. Acute respiratory failure with hypoxia likely related to new left upper lobe pneumonia. Chest x-ray with same as well as small effusions and bibasilar airspace disease. Venous blood gas with a pH of 7.33 PCO2 38.2 PO2 59, oxygen saturation level 82% on room air. Oxygen saturation level 94% on nonrebreather -Admit to step down -Await blood cultures -Continue oxygen supplementation and wean as able -Antibiotics per protocol  #2. Healthcare associated pneumonia. Chest x-ray as noted above. WBC 16.7, lactic acid within the limits of normal, maximum temperature 99.4, hemodynamically stable. Oxygen saturation level 82% on room air. -Antibiotics per protocol -Blood cultures -Strep pneumo urine antigen -Legionella urine antigen -Oxygen supplementation and wean as able -Nebulizers every 6 hours  #3. End-stage renal disease. He is a Monday Wednesday Friday dialysis patient therefore is due today. Creatinine on admission 6.99. This appears to be at the higher end of his range. -Request nephrology consult  #4. Hyperkalemia. Mild. Related to ESRD. EKG NSR -monitor on tele -dailysis  #5.  Anemia. Macrocytic. Likely related to chronic disease. Hemoglobin on admission 10.3. Chart review indicates is the higher end baseline range. No signs symptoms of active bleeding -monitor -continue home supplements -Anemia panel done 10 days ago see chart  #6. Hypertension. Fair control in the Emergency department. Home medications include amlodipine, Catapres, hydralazine.  -Continue home medications -Monitor closely  #7. Hypothyroidism. SH done 10 days ago 4.5. Free T4 within the limits of normal -Continue home meds  #8. Diabetes. Very recent A1c  6.4. Serum glucose 244 on admission - Will continue his home Lantus at half the dose. - Use sliding scale for optimal control -May need meal coverage as well. His appetite was good while in ED  #9. Thrombocytopenia. Chronic. Platelets 130 on admission. Stable at baseline -monitor  #10. Chest pain. A clear pleuritic. Resolved on admission. initial troponin negative. EKG with normal sinus rhythm no acute changes -monitor on tele -cylce troponin -serial EKG   nephrology Code Status: full DVT Prophylaxis: Family Communication: none present Disposition Plan: home when ready  Time spent: 60 minutes  [facility]

## 2015-03-08 NOTE — Consult Note (Signed)
Reason for Consult: Continuity of ESRD care Referring Physician: Linna Darner M.D.  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 He also has underlying diabetes mellitus, schizophrenia and is visually impaired.  Was brought into the emergency room by his helper after he voiced to be having substernal chest pain and shortness of breath. In the emergency room found to be febrile and chest x-ray shows left upper lobe pneumonia. The patient reports to have been feeling weak over the weekend and denies any nausea or vomiting. He last went to dialysis on Friday.  He was just in the hospital between 02/24/15 and 02/26/15. Records are requested and pending from [facility].  Past Medical History  Diagnosis Date  . Diabetes mellitus   . Hyperthyroidism   . Quadriparesis (muscle weakness) ([place])   . Constipation   . Schizo-affective schizophrenia ([place])   . Hypertension   . Depression   . ESRD (end stage renal disease) on dialysis [facility])     "[place] off [address]; MWF" (02/25/2015)    Past Surgical History  Procedure Laterality Date  . Dialysis fistula creation    . Multiple extractions with alveoloplasty  01/17/2011    Procedure: MULTIPLE EXTRACION WITH ALVEOLOPLASTY;  Surgeon: Gae Bon;  Location: [facility] OR;  Service: Oral Surgery;  Laterality: Bilateral;  Extraction of number one, sixteen, seventeen, and thirty two.     Family History  Problem Relation Age of Onset  . Family history unknown: Yes    Social History:  reports that he has never smoked. He does not have any smokeless tobacco history on file. He reports that he does not drink alcohol or use illicit drugs.  Allergies:  Allergies  Allergen Reactions  . Lactose Intolerance (Gi) Diarrhea  . Polymyxin B Swelling    "Can't take this eye drop," per outside records.   . Sulfamethoxazole Rash    "Swelling of area, face, from drop that had sulfa," per outside records.     Medications:  Scheduled: . albuterol  2.5 mg Nebulization Q6H  . amLODipine  10 mg Oral QHS  . atropine  1 drop Left Eye Daily  . brimonidine  1 drop Left Eye TID  . cinacalcet  30 mg Oral See admin instructions  . cloNIDine  0.1 mg Oral TID  . heparin  5,000 Units Subcutaneous 3 times per day  . insulin aspart  0-5 Units Subcutaneous QHS  . insulin aspart  0-9 Units Subcutaneous TID WC  . insulin glargine  6 Units Subcutaneous QHS  . labetalol  100 mg Oral BID  . multivitamin  1 tablet Oral Daily  . pantoprazole  40 mg Oral Daily  . prednisoLONE acetate  1 drop Left Eye BID  . risperiDONE  2 mg Oral BID  . senna-docusate  1 tablet Oral BID  . sevelamer carbonate  800 mg Oral TID  . simvastatin  20 mg Oral QHS  . sodium chloride flush  3 mL Intravenous Q12H  . temazepam  15 mg Oral QHS    BMP Latest Ref Rng 03/08/2015 03/08/2015 02/26/2015  Glucose 65 - 99 mg/dL 244(H) 242(H) 150(H)  BUN 6 - 20 mg/dL 59(H) 66(H) 76(H)  Creatinine 0.61 - 1.24 mg/dL 6.50(H) 6.99(H) 6.93(H)  Sodium 135 - 145 mmol/L 139 140 138  Potassium 3.5 - 5.1 mmol/L  5.2(H) 5.4(H) 4.4  Chloride 101 - 111 mmol/L 106 103 104  CO2 22 - 32 mmol/L - 20(L) 22  Calcium 8.9 - 10.3 mg/dL - 9.3 8.4(L)   CBC Latest Ref Rng 03/08/2015 03/08/2015 02/26/2015  WBC 4.0 - 10.5 K/uL - 16.7(H) 5.2  Hemoglobin 13.0 - 17.0 g/dL 11.9(L) 10.3(L) 9.7(L)  Hematocrit 39.0 - 52.0 % 35.0(L) 32.0(L) 28.3(L)  Platelets 150 - 400 K/uL - 130(L) 128(L)    Chest x-ray: Left upper lobe pneumonia with small pleural effusions bilaterally as well as bibasal atelectasis.   Review of Systems  Constitutional: Positive for fever, chills and malaise/fatigue.  HENT: Negative.   Eyes: Negative for pain and discharge.       Visually impaired  Respiratory: Positive for cough and shortness of  breath. Negative for hemoptysis, sputum production and wheezing.   Cardiovascular: Positive for chest pain and orthopnea. Negative for leg swelling and PND.  Gastrointestinal: Positive for nausea. Negative for heartburn, abdominal pain and diarrhea.  Genitourinary: Negative.   Musculoskeletal: Negative.   Skin: Negative.   Psychiatric/Behavioral: The patient is nervous/anxious.    Blood pressure 111/77, pulse 91, temperature 99.4 F (37.4 C), temperature source Rectal, resp. rate 21, weight 70.308 kg (155 lb), SpO2 96 %. Physical Exam  Nursing note and vitals reviewed. Constitutional: He appears well-nourished. No distress.  HENT:  Head: Normocephalic.  Mouth/Throat: Oropharyngeal exudate present.  Old right temporal scar  Eyes:  Upward gaze bilaterally-lenticular haziness  Neck: Normal range of motion. Neck supple. JVD present. No tracheal deviation present. No thyromegaly present.  11 cm JVP  Cardiovascular: Normal rate, regular rhythm and normal heart sounds.  Exam reveals no friction rub.   No murmur heard. Respiratory: Effort normal. He has rales.  Left base rales  GI: Soft. Bowel sounds are normal. He exhibits no distension. There is no tenderness.  Neurological:  Somnolent but awakens on calling his name loudly  Skin: Skin is warm and dry. No rash noted. There is erythema.  Right lower extremity erythema noted    Assessment/Plan: 1. Healthcare associated pneumonia: Sepsis protocol activated and started on broad-spectrum antibiotic therapy. 2. End-stage renal disease: Mild hyperkalemia noted on labs but he is not grossly volume overloaded on physical exam-unclear about the chronicity of his lower extremity edema as the patient himself is unable to tell me. Plan for hemodialysis today in keeping with his usual outpatient schedule of Monday/Wednesday/Friday. 3. Anemia of chronic kidney disease: Hemoglobin appears to be currently at goal, will continue to monitor trend and  consider ESA if [id number]. 4. Metabolic bone disease: Resume phosphorus binder (will verify dose that indeed this is sevelamer 800 mg 3 times a day before meals) and restart Sensipar for PTH suppression. 5. Hypertension: Blood pressures are acceptable, continue current antihypertensive therapy and attempt ultrafiltration with hemodialysis.    Monea Pesantez K. 03/08/2015, 12:27 PM

## 2015-03-08 NOTE — Procedures (Signed)
Patient seen on Hemodialysis. QB 400, UF goal 2.5L Treatment adjusted as needed.  Elmarie Shiley MD [facility]. Office # [phone] Pager # [phone] 2:59 PM

## 2015-03-09 DIAGNOSIS — R079 Chest pain, unspecified: Secondary | ICD-10-CM

## 2015-03-09 DIAGNOSIS — E0590 Thyrotoxicosis, unspecified without thyrotoxic crisis or storm: Secondary | ICD-10-CM

## 2015-03-09 LAB — CBC
HCT: 30.2 % — ABNORMAL LOW (ref 39.0–52.0)
Hemoglobin: 9.5 g/dL — ABNORMAL LOW (ref 13.0–17.0)
MCH: 31.4 pg (ref 26.0–34.0)
MCHC: 31.5 g/dL (ref 30.0–36.0)
MCV: 99.7 fL (ref 78.0–100.0)
Platelets: 144 10*3/uL — ABNORMAL LOW (ref 150–400)
RBC: 3.03 MIL/uL — ABNORMAL LOW (ref 4.22–5.81)
RDW: 18.7 % — ABNORMAL HIGH (ref 11.5–15.5)
WBC: 12.8 10*3/uL — ABNORMAL HIGH (ref 4.0–10.5)

## 2015-03-09 LAB — COMPREHENSIVE METABOLIC PANEL
ALT: 15 U/L — ABNORMAL LOW (ref 17–63)
AST: 27 U/L (ref 15–41)
Albumin: 3 g/dL — ABNORMAL LOW (ref 3.5–5.0)
Alkaline Phosphatase: 144 U/L — ABNORMAL HIGH (ref 38–126)
Anion gap: 20 — ABNORMAL HIGH (ref 5–15)
BUN: 38 mg/dL — ABNORMAL HIGH (ref 6–20)
CO2: 21 mmol/L — ABNORMAL LOW (ref 22–32)
Calcium: 9.1 mg/dL (ref 8.9–10.3)
Chloride: 96 mmol/L — ABNORMAL LOW (ref 101–111)
Creatinine, Ser: 4.58 mg/dL — ABNORMAL HIGH (ref 0.61–1.24)
GFR calc Af Amer: 17 mL/min — ABNORMAL LOW (ref >60)
GFR calc non Af Amer: 14 mL/min — ABNORMAL LOW (ref >60)
Glucose, Bld: 324 mg/dL — ABNORMAL HIGH (ref 65–99)
Potassium: 4.3 mmol/L (ref 3.5–5.1)
Sodium: 137 mmol/L (ref 135–145)
Total Bilirubin: 0.9 mg/dL (ref 0.3–1.2)
Total Protein: 6.4 g/dL — ABNORMAL LOW (ref 6.5–8.1)

## 2015-03-09 LAB — HIV ANTIBODY (ROUTINE TESTING W REFLEX): HIV Screen 4th Generation wRfx: NONREACTIVE

## 2015-03-09 LAB — GLUCOSE, CAPILLARY
Glucose-Capillary: 235 mg/dL — ABNORMAL HIGH (ref 65–99)
Glucose-Capillary: 296 mg/dL — ABNORMAL HIGH (ref 65–99)
Glucose-Capillary: 215 mg/dL — ABNORMAL HIGH (ref 65–99)

## 2015-03-09 LAB — MRSA PCR SCREENING: MRSA by PCR: NEGATIVE

## 2015-03-09 NOTE — Progress Notes (Signed)
Pt refusing SCD placement. Pt states, "I used them last time and did not like them." Educated pt. Pt also educated on dietary restrictions d/t continuing to request peanut butter and gram crackers. Pt very agitated at times with staff and refusing care. Notified MD. Will continue to monitor.

## 2015-03-09 NOTE — Progress Notes (Signed)
Patient ID: [name], male   DOB: 07-Dec-1970, 45 y.o.   MRN: [id number]  [facility] Progress Note   Assessment/ Plan:   1. Healthcare associated pneumonia: Appears to be clinically better overnight-afebrile and with improvement of leukocytosis. Currently on vancomycin and cefepime. 2. End-stage renal disease: Status post hemodialysis yesterday with challenging of dry weight given presence of pedal edema-will order for next hemodialysis session tomorrow (usually Monday/Wednesday/Friday). 3. Anemia of chronic kidney disease: Hemoglobin slightly lower than yesterday-9.5, and restart his ESA 4. Metabolic bone disease: continue Renvela-will reconcile medication list with that from outpatient unit, continue Sensipar. 5. Hypertension: Blood pressures are acceptable, continue current antihypertensive therapy and attempt ultrafiltration with hemodialysis.  Subjective:   Reports to have had an uneventful night-complaints about the quality of the meals that he got. Denies any shortness of breath or chest pain at this time.    Objective:   BP 154/91 mmHg  Pulse 92  Temp(Src) 98.8 F (37.1 C) (Oral)  Resp 27  Ht 6\' 1"  (1.854 m)  Wt 70.1 kg (154 lb 8.7 oz)  BMI 20.39 kg/m2  SpO2 97%  Physical Exam: Gen: Comfortably resting in bed-listening to television CVS: Pulse regular in rate and rhythm, S1 and S2 normal Resp: Decreased breath sounds over bases otherwise clear to auscultation Abd: Soft, flat, nontender Ext: Trace ankle/leg edema with persistent erythema right leg  Labs: BMET  Recent Labs Lab 03/08/15 1014 03/08/15 1028 03/09/15 0306  NA 140 139 137  K 5.4* 5.2* 4.3  CL 103 106 96*  CO2 20*  --  21*  GLUCOSE 242* 244* 324*  BUN 66* 59* 38*  CREATININE 6.99* 6.50* 4.58*  CALCIUM 9.3  --  9.1   CBC  Recent Labs Lab 03/08/15 1014 03/08/15 1028 03/09/15 0306  WBC 16.7*  --  12.8*  NEUTROABS 14.8*  --   --   HGB 10.3* 11.9* 9.5*  HCT 32.0* 35.0*  30.2*  MCV 100.9*  --  99.7  PLT 130*  --  144*   Medications:    . albuterol  2.5 mg Nebulization TID  . amLODipine  10 mg Oral QHS  . atropine  1 drop Left Eye Daily  . brimonidine  1 drop Left Eye TID  . [START ON 03/10/2015] ceFEPime (MAXIPIME) IV  2 g Intravenous Q M,W,F-HD  . cinacalcet  30 mg Oral Once per day on Tue Sat  . cloNIDine  0.1 mg Oral TID  . heparin  5,000 Units Subcutaneous 3 times per day  . insulin aspart  0-5 Units Subcutaneous QHS  . insulin aspart  0-9 Units Subcutaneous TID WC  . insulin glargine  6 Units Subcutaneous QHS  . labetalol  100 mg Oral BID  . multivitamin  1 tablet Oral QHS  . pantoprazole  40 mg Oral Daily  . prednisoLONE acetate  1 drop Left Eye BID  . risperiDONE  2 mg Oral BID  . senna-docusate  1 tablet Oral BID  . sevelamer carbonate  800 mg Oral TID WC  . simvastatin  20 mg Oral QHS  . sodium chloride flush  3 mL Intravenous Q12H  . temazepam  15 mg Oral QHS  . [START ON 03/10/2015] vancomycin  750 mg Intravenous Q M,W,F-HD   Elmarie Shiley, MD 03/09/2015, 8:27 AM

## 2015-03-09 NOTE — Progress Notes (Signed)
Received from [facility] via bed. Assessment as charted.

## 2015-03-09 NOTE — Progress Notes (Signed)
[facility]                                                                              Patient Demographics  [name], is a 45 y.o. male, DOB - [birth year], [id number]  Admit date - 03/08/2015   Admitting Physician Waldemar Dickens, MD  Outpatient Primary MD for the patient is Maggie Font, MD  LOS - 1   Chief Complaint  Patient presents with  . Chest Pain      Interim history 45 year old male with history of ESRD, schizophrenia, diabetes mellitus, presented for acute respiratory failure. He did undergo dialysis on 03/08/2015. History status as appear to be improving. Patient also being treated for pneumonia.  Assessment & Plan   Acute respiratory failure with hypoxia -Likely secondary to left upper lobe pneumonia -Chest x-ray showed small effusions and by basilar airspace disease -Positive admission, oxygen saturations were 80% on room air -Currently oxygenation in the upper 90s and 1.5 L nasal cannula -Will continue to wean O2  HCAP -Chest x-ray: Left upper lobe pneumonia -Leukocytosis improving -Continue vancomycin and cefepime -Blood cultures show no growth to date  Pleuritic chest pain -Resolved -EKG showed sinus rhythm and no acute changes, troponins cycled and negative  End-stage renal disease -Patient dialyzes Monday, Wednesday, Friday -Nephrology consulted and appreciated  Hyperkalemia -Likely secondary to ESRD -Resolved, continue monitor BMP  Anemia secondary to chronic disease -Hemoglobin currently 9.5, appears to be close to baseline -Continue monitor CBC  Essential hypertension -Continue amlodipine, clonidine, labetalol  Hypothyroidism -Currently on no home medications -TSH on 02/24/2015 was 4.5, free T4 0.78  Diabetes mellitus, type II -Continue Lantus, and complains go, CBG monitoring -Hemoglobin A1c 02/24/2015 was 6.4  Thrombocytopenia -Platelets currently 144, improved from admission -Continue to monitor  CBC  Schizophrenia -Continue Risperdal  Hyperlipidemia -Continue statin  Code Status: Full  Family Communication: None at bedside  Disposition Plan: Admitted. Transfer to 6E  Time Spent in minutes   30 minutes  Procedures  None  Consults   Nephrology   DVT Prophylaxis  heparin  Lab Results  Component Value Date   PLT 144* 03/09/2015    Medications  Scheduled Meds: . albuterol  2.5 mg Nebulization TID  . amLODipine  10 mg Oral QHS  . atropine  1 drop Left Eye Daily  . brimonidine  1 drop Left Eye TID  . [START ON 03/10/2015] ceFEPime (MAXIPIME) IV  2 g Intravenous Q M,W,F-HD  . cinacalcet  30 mg Oral Once per day on Tue Sat  . cloNIDine  0.1 mg Oral TID  . heparin  5,000 Units Subcutaneous 3 times per day  . insulin aspart  0-5 Units Subcutaneous QHS  . insulin aspart  0-9 Units Subcutaneous TID WC  . insulin glargine  6 Units Subcutaneous QHS  . labetalol  100 mg Oral BID  . multivitamin  1 tablet Oral QHS  . pantoprazole  40 mg Oral Daily  . prednisoLONE acetate  1 drop Left Eye BID  . risperiDONE  2 mg Oral BID  . senna-docusate  1 tablet Oral BID  . sevelamer carbonate  800 mg Oral TID WC  . simvastatin  20 mg  Oral QHS  . sodium chloride flush  3 mL Intravenous Q12H  . temazepam  15 mg Oral QHS  . [START ON 03/10/2015] vancomycin  750 mg Intravenous Q M,W,F-HD   Continuous Infusions:  PRN Meds:.sodium chloride, sodium chloride, sodium chloride, acetaminophen **OR** acetaminophen, alteplase, bisacodyl, gi cocktail, heparin, HYDROcodone-acetaminophen, lidocaine (PF), lidocaine-prilocaine, LORazepam, morphine injection, ondansetron **OR** ondansetron (ZOFRAN) IV, pentafluoroprop-tetrafluoroeth, sodium chloride flush  Antibiotics    Anti-infectives    Start     Dose/Rate Route Frequency Ordered Stop   03/10/15 1200  ceFEPIme (MAXIPIME) 2 g in dextrose 5 % 50 mL IVPB     2 g 100 mL/hr over 30 Minutes Intravenous Every M-W-F (Hemodialysis) 03/08/15 1240      03/10/15 1200  vancomycin (VANCOCIN) IVPB 750 mg/150 ml premix     750 mg 150 mL/hr over 60 Minutes Intravenous Every M-W-F (Hemodialysis) 03/08/15 1240     03/08/15 1800  ceFEPIme (MAXIPIME) 1 g in dextrose 5 % 50 mL IVPB    Comments:  Please give at end of dialysis session on 1/30.   1 g 100 mL/hr over 30 Minutes Intravenous To Hemodialysis 03/08/15 1240 03/08/15 1747   03/08/15 1800  vancomycin (VANCOCIN) IVPB 750 mg/150 ml premix     750 mg 150 mL/hr over 60 Minutes Intravenous To Hemodialysis 03/08/15 1240 03/08/15 1819   03/08/15 1400  ceFEPIme (MAXIPIME) 1 g in dextrose 5 % 50 mL IVPB  Status:  Discontinued     1 g 100 mL/hr over 30 Minutes Intravenous 3 times per day 03/08/15 1203 03/08/15 1231   03/08/15 1000  ceFEPIme (MAXIPIME) 2 g in dextrose 5 % 50 mL IVPB     2 g 100 mL/hr over 30 Minutes Intravenous  Once 03/08/15 0955 03/08/15 1152   03/08/15 1000  vancomycin (VANCOCIN) 1,500 mg in sodium chloride 0.9 % 500 mL IVPB     1,500 mg 250 mL/hr over 120 Minutes Intravenous  Once 03/08/15 0955 03/08/15 1400      Subjective:   [name] seen and examined today.   Patient has no complaints this morning. Denies any shortness breath, chest pain, dull pain, nausea or vomiting. Would like to have graham crackers and peanut butter with diet ginger ale.  Objective:   Filed Vitals:   03/09/15 0748 03/09/15 0758 03/09/15 0939 03/09/15 1200  BP: 154/91  158/90 148/90  Pulse: 92  95 87  Temp: 98.8 F (37.1 C)   98.5 F (36.9 C)  TempSrc: Oral   Axillary  Resp: 27   17  Height:      Weight:      SpO2: 99% 97%  96%    Wt Readings from Last 3 Encounters:  03/09/15 70.1 kg (154 lb 8.7 oz)  02/26/15 73.3 kg (161 lb 9.6 oz)  01/17/11 74 kg (163 lb 2.3 oz)     Intake/Output Summary (Last 24 hours) at 03/09/15 1246 Last data filed at 03/09/15 1033  Gross per 24 hour  Intake    940 ml  Output   2000 ml  Net  -1060 ml    Exam  General: Well developed, well  nourished, NAD  HEENT: NCAT,  mucous membranes moist.   Cardiovascular: S1 S2 auscultated, RRR  Respiratory: Diminished breath sounds however clear  Abdomen: Soft, nontender, nondistended, + bowel sounds  Extremities: warm dry without cyanosis clubbing. Trace LE edema B/L  Neuro: AAOx3, very cooperative with neurological exam.  Skin: Erythema right lower extremity.  Data Review  Micro Results Recent Results (from the past 240 hour(s))  Blood Culture (routine x 2)     Status: None (Preliminary result)   Collection Time: 03/08/15 10:14 AM  Result Value Ref Range Status   Specimen Description BLOOD LEFT ANTECUBITAL  Final   Special Requests BOTTLES DRAWN AEROBIC AND ANAEROBIC 5CCS  Final   Culture NO GROWTH 1 DAY  Final   Report Status PENDING  Incomplete  Blood Culture (routine x 2)     Status: None (Preliminary result)   Collection Time: 03/08/15 10:39 AM  Result Value Ref Range Status   Specimen Description BLOOD LEFT HAND  Final   Special Requests BOTTLES DRAWN AEROBIC AND ANAEROBIC 5CCS  Final   Culture NO GROWTH 1 DAY  Final   Report Status PENDING  Incomplete  MRSA PCR Screening     Status: None   Collection Time: 03/08/15  7:46 PM  Result Value Ref Range Status   MRSA by PCR NEGATIVE NEGATIVE Final    Comment:        The GeneXpert MRSA Assay (FDA approved for NASAL specimens only), is one component of a comprehensive MRSA colonization surveillance program. It is not intended to diagnose MRSA infection nor to guide or monitor treatment for MRSA infections.     Radiology Reports Dg Chest 2 View  02/24/2015  CLINICAL DATA:  Constipation, weakness, quadriplegia EXAM: CHEST  2 VIEW COMPARISON:  11/24/2010 FINDINGS: Cardiac size is stable. No acute infiltrate or pulmonary edema. Mild basilar atelectasis. There is soft tissue prominence right paratracheal region. Mass or adenopathy cannot be excluded. Further correlation with enhanced CT scan of the chest is  recommended. IMPRESSION: No infiltrate or pulmonary edema. There is soft tissue prominence in right upper paratracheal region. Mass or adenopathy cannot be excluded. Further correlation with CT scan of the chest is recommended. Electronically Signed   By: Lahoma Crocker M.D.   On: 02/24/2015 18:21   Abd 1 View (kub)  02/24/2015  CLINICAL DATA:  Constipation starting last night, quadriparesis EXAM: ABDOMEN - 1 VIEW COMPARISON:  None. FINDINGS: Minimal gaseous distended small bowel loops lower abdomen without air-fluid levels. Abundant stool noted in right colon. Moderate stool and gas noted in descending colon and sigmoid colon. Atherosclerotic calcifications of femoral and iliac arteries. IMPRESSION: No evidence of small bowel obstruction. Colonic stool as described above. Electronically Signed   By: Lahoma Crocker M.D.   On: 02/24/2015 20:05   Ct Chest Wo Contrast  02/25/2015  CLINICAL DATA:  Dialysis patient. Nonverbal. Soft tissue prominence in the RIGHT upper paratracheal region. EXAM: CT CHEST WITHOUT CONTRAST TECHNIQUE: Multidetector CT imaging of the chest was performed following the standard protocol without IV contrast. COMPARISON:  Chest radiograph 02/24/2015 FINDINGS: Mediastinum/Nodes: No axillary supraclavicular adenopathy. Normal thyroid gland. No mediastinal hilar adenopathy. No pericardial fluid. Coronary artery calcifications are present. Knee RIGHT paratracheal density on comparison radiograph corresponds to an ectatic superior vena cava. Lungs/Pleura: There bilateral pleural effusions. There is mild airspace disease in the RIGHT lower lobe (image 42, series 3) airways are normal. Upper abdomen: Limited view of the liver, kidneys, pancreas are unremarkable. Normal adrenal glands. Mild airspace disease in the LEFT lower lobe to a lesser degree. Musculoskeletal: Diffuse sclerosis of the bones.  No acute findings. IMPRESSION: 1. Ectatic superior vena cava corresponds to the plain film abnormality, a  benign finding. 2. Bilateral pleural effusions. 3. Mild airspace disease in the LEFT and RIGHT lower lobe consistent with atelectasis, pneumonia, or aspiration. Electronically Signed  By: Suzy Bouchard M.D.   On: 02/25/2015 14:17   Dg Chest Port 1 View  03/08/2015  CLINICAL DATA:  Hypoxia.  Fever. EXAM: PORTABLE CHEST - 1 VIEW COMPARISON:  One-view chest x-ray 02/24/2015 and CT chest 02/25/2015. FINDINGS: The heart is mildly enlarged. New left upper lobe pneumonia is present. There is some airspace disease at the right base the there is some airspace disease at the lung bases bilaterally. Small effusions still persist. IMPRESSION: 1. New left upper lobe pneumonia. 2. Small effusions and bibasilar airspace disease. While this may reflect atelectasis, infection is not excluded. Electronically Signed   By: San Morelle M.D.   On: 03/08/2015 10:26    CBC  Recent Labs Lab 03/08/15 1014 03/08/15 1028 03/09/15 0306  WBC 16.7*  --  12.8*  HGB 10.3* 11.9* 9.5*  HCT 32.0* 35.0* 30.2*  PLT 130*  --  144*  MCV 100.9*  --  99.7  MCH 32.5  --  31.4  MCHC 32.2  --  31.5  RDW 19.1*  --  18.7*  LYMPHSABS 0.9  --   --   MONOABS 0.9  --   --   EOSABS 0.0  --   --   BASOSABS 0.0  --   --     Chemistries   Recent Labs Lab 03/08/15 1014 03/08/15 1028 03/08/15 1217 03/09/15 0306  NA 140 139  --  137  K 5.4* 5.2*  --  4.3  CL 103 106  --  96*  CO2 20*  --   --  21*  GLUCOSE 242* 244*  --  324*  BUN 66* 59*  --  38*  CREATININE 6.99* 6.50*  --  4.58*  CALCIUM 9.3  --   --  9.1  MG  --   --  2.2  --   AST 30  --   --  27  ALT 15*  --   --  15*  ALKPHOS 155*  --   --  144*  BILITOT 1.0  --   --  0.9   ------------------------------------------------------------------------------------------------------------------ estimated creatinine clearance is 20.6 mL/min (by C-G formula based on Cr of  4.58). ------------------------------------------------------------------------------------------------------------------ No results for input(s): HGBA1C in the last 72 hours. ------------------------------------------------------------------------------------------------------------------ No results for input(s): CHOL, HDL, LDLCALC, TRIG, CHOLHDL, LDLDIRECT in the last 72 hours. ------------------------------------------------------------------------------------------------------------------ No results for input(s): TSH, T4TOTAL, T3FREE, THYROIDAB in the last 72 hours.  Invalid input(s): FREET3 ------------------------------------------------------------------------------------------------------------------ No results for input(s): VITAMINB12, FOLATE, FERRITIN, TIBC, IRON, RETICCTPCT in the last 72 hours.  Coagulation profile  Recent Labs Lab 03/08/15 1014  INR 1.34    No results for input(s): DDIMER in the last 72 hours.  Cardiac Enzymes  Recent Labs Lab 03/08/15 1217 03/08/15 2132  TROPONINI <0.03 0.03   ------------------------------------------------------------------------------------------------------------------ Invalid input(s): POCBNP    Catheline Hixon D.O. on 03/09/2015 at 12:46 PM  Between 7am to 7pm - Pager - [phone]  After 7pm go to [email or web address] - password [email or web address]  And look for the night coverage person covering for me after hours  [facility] Office  [phone]

## 2015-03-09 NOTE — Evaluation (Signed)
Physical Therapy Evaluation Patient Details Name: [name] MRN: [id number] DOB: 1970-04-17 Today's Date: 03/09/2015   History of Present Illness  [name] is a 45 y.o. male with past medical history that includes quadreparesis, end-stage renal disease a Monday Wednesday Friday dialysis patient insulin-dependent diabetes, hypothyroidism, or paresis and schizophrenia presents to the emergency department with the chief complaint of shortness of breath and chest pain.  Clinical Impression  Pt appears near baseline based on report from caregiver [name] however unable to observe OOB mobility. Pt was following commands and answering questions and then became agitated and start yelling "stop messing with me." Per caregiver [name], pt requires assist for all ADLs and transfers. Suspect pt will be safe for d/c back to group home once medically stable. Pt with all appropriate DME and supportive caregivers 24/7. Acute PT to con't to work with patient in attempt to transfer him OOB to chair as he sits in his w/c at home all day typically.    Follow Up Recommendations No PT follow up;Supervision/Assistance - 24 hour    Equipment Recommendations  None recommended by PT    Recommendations for Other Services       Precautions / Restrictions Precautions Precautions: Fall Precaution Comments: pt with schizophrenia and TBI, difficulty to communicate with      Mobility  Bed Mobility Overal bed mobility: Needs Assistance Bed Mobility: Rolling Rolling: Mod assist         General bed mobility comments: pt became agitated and reported "stop messing with me"  Transfers                    Ambulation/Gait                Stairs            Wheelchair Mobility    Modified Rankin (Stroke Patients Only)       Balance                                             Pertinent Vitals/Pain Pain Assessment: Faces Faces Pain Scale: Hurts  little more Pain Location: chest    Home Living Family/patient expects to be discharged to:: Group home                 Additional Comments: spoke with [name] his caregiver. Ch    Prior Function Level of Independence: Needs assistance   Gait / Transfers Assistance Needed: chris his caregiver does a stand pvt transfer with pt to/from [facility] or w/c. [name] reports "he doesn't do much". [name] does dressing, bathing, transfers and pushes him in w/c  ADL's / Homemaking Assistance Needed: caregiver feeds him        Hand Dominance        Extremity/Trunk Assessment   Upper Extremity Assessment:  (grossly 3+/5)           Lower Extremity Assessment:  (pt refused to allow PT to address [name])         Communication   Communication: Receptive difficulties;Expressive difficulties  Cognition Arousal/Alertness: Lethargic Behavior During Therapy: Flat affect Overall Cognitive Status: History of cognitive impairments - at baseline (pt easily agitated and refused to sit EOB or participate anymore with PT)  General Comments      Exercises        Assessment/Plan    PT Assessment Patient needs continued PT services  PT Diagnosis Quadraplegia   PT Problem List Decreased strength;Decreased range of motion;Decreased activity tolerance;Decreased balance;Decreased mobility;Decreased cognition  PT Treatment Interventions     PT Goals (Current goals can be found in the Care Plan section) Acute Rehab PT Goals Patient Stated Goal: didn't state PT Goal Formulation: Patient unable to participate in goal setting Time For Goal Achievement: 03/16/15 Potential to Achieve Goals: Fair    Frequency Min 2X/week   Barriers to discharge        Co-evaluation               End of Session   Activity Tolerance: Treatment limited secondary to agitation Patient left: in bed;with call bell/phone within reach;with nursing/sitter in room Nurse  Communication: Mobility status         Time: 1012-1027 PT Time Calculation (min) (ACUTE ONLY): 15 min   Charges:   PT Evaluation $PT Eval Moderate Complexity: 1 Procedure     PT G CodesKingsley Callander 03/09/2015, 3:26 PM   Kittie Plater, PT, DPT Pager #: [phone] Office #: [phone]

## 2015-03-09 NOTE — Progress Notes (Signed)
Called report to [place]. Pt transferred. Left message for [name] regarding transfer.

## 2015-03-09 NOTE — Progress Notes (Signed)
Utilization review completed. Ambri Miltner, RN, BSN. 

## 2015-03-09 NOTE — Progress Notes (Addendum)
Inpatient Diabetes Program Recommendations  AACE/ADA: New Consensus Statement on Inpatient Glycemic Control (2015)  Target Ranges:  Prepandial:   less than 140 mg/dL      Peak postprandial:   less than 180 mg/dL (1-2 hours)      Critically ill patients:  140 - 180 mg/dL    Results for FLORA, SWARTOUT (MRN [id number]) as of 03/09/2015 14:05  Ref. Range 03/08/2015 10:49 03/08/2015 14:19 03/08/2015 22:11  Glucose-Capillary Latest Ref Range: 65-99 mg/dL 245 (H) 277 (H) 289 (H)    Results for JOSEPHANTHONY, JOKELA (MRN [id number]) as of 03/09/2015 14:05  Ref. Range 03/09/2015 07:45 03/09/2015 11:49  Glucose-Capillary Latest Ref Range: 65-99 mg/dL 235 (H) 296 (H)     Admit: Pneumonia/ Hyperkalemia.   History: DM, ESRD, Schizophrenia  Home Insulin: Lantus 10 units daily             Humalog 5 units tid              Humalog SSI  Current Insulin Orders: Lantus 6 units QHS      Novolog Sensitive SSI (0-9 units) TID AC + HS     MD- Please consider the following in-hospital insulin adjustments:  1. Increase Lantus to 10 units QHS  2. Start Novolog 5 units tidwc (home dose of meal coverage- presently eating 100% of meals)      --Will follow patient during hospitalization--  Wyn Quaker RN, MSN, CDE Diabetes Coordinator Inpatient Glycemic Control Team Team Pager: [phone] (8a-5p)

## 2015-03-10 LAB — BASIC METABOLIC PANEL
Anion gap: 11 (ref 5–15)
BUN: 57 mg/dL — ABNORMAL HIGH (ref 6–20)
CO2: 26 mmol/L (ref 22–32)
Calcium: 8.6 mg/dL — ABNORMAL LOW (ref 8.9–10.3)
Chloride: 102 mmol/L (ref 101–111)
Creatinine, Ser: 6.08 mg/dL — ABNORMAL HIGH (ref 0.61–1.24)
GFR calc Af Amer: 12 mL/min — ABNORMAL LOW (ref >60)
GFR calc non Af Amer: 10 mL/min — ABNORMAL LOW (ref >60)
Glucose, Bld: 161 mg/dL — ABNORMAL HIGH (ref 65–99)
Potassium: 4.6 mmol/L (ref 3.5–5.1)
Sodium: 139 mmol/L (ref 135–145)

## 2015-03-10 LAB — CBC
HCT: 31.6 % — ABNORMAL LOW (ref 39.0–52.0)
Hemoglobin: 10.2 g/dL — ABNORMAL LOW (ref 13.0–17.0)
MCH: 32.1 pg (ref 26.0–34.0)
MCHC: 32.3 g/dL (ref 30.0–36.0)
MCV: 99.4 fL (ref 78.0–100.0)
Platelets: 132 10*3/uL — ABNORMAL LOW (ref 150–400)
RBC: 3.18 MIL/uL — ABNORMAL LOW (ref 4.22–5.81)
RDW: 18.2 % — ABNORMAL HIGH (ref 11.5–15.5)
WBC: 9.6 10*3/uL (ref 4.0–10.5)

## 2015-03-10 LAB — GLUCOSE, CAPILLARY
Glucose-Capillary: 259 mg/dL — ABNORMAL HIGH (ref 65–99)
Glucose-Capillary: 135 mg/dL — ABNORMAL HIGH (ref 65–99)
Glucose-Capillary: 155 mg/dL — ABNORMAL HIGH (ref 65–99)
Glucose-Capillary: 262 mg/dL — ABNORMAL HIGH (ref 65–99)

## 2015-03-10 MED ORDER — INSULIN ASPART 100 UNIT/ML ~~LOC~~ SOLN
5.0000 [IU] | Freq: Three times a day (TID) | SUBCUTANEOUS | Status: DC
  Administered 2015-03-10 – 2015-03-11 (×4): 5 [IU] via SUBCUTANEOUS

## 2015-03-10 MED ORDER — PRO-STAT SUGAR FREE PO LIQD
30.0000 mL | Freq: Two times a day (BID) | ORAL | Status: DC
  Administered 2015-03-10: 30 mL via ORAL
  Filled 2015-03-10 (×2): qty 30

## 2015-03-10 MED ORDER — INSULIN GLARGINE 100 UNIT/ML ~~LOC~~ SOLN
10.0000 [IU] | Freq: Every day | SUBCUTANEOUS | Status: DC
  Administered 2015-03-10: 10 [IU] via SUBCUTANEOUS
  Filled 2015-03-10 (×2): qty 0.1

## 2015-03-10 MED ORDER — ALBUTEROL SULFATE (2.5 MG/3ML) 0.083% IN NEBU
2.5000 mg | INHALATION_SOLUTION | Freq: Two times a day (BID) | RESPIRATORY_TRACT | Status: DC
  Administered 2015-03-11: 2.5 mg via RESPIRATORY_TRACT
  Filled 2015-03-10 (×2): qty 3

## 2015-03-10 MED ORDER — HEPARIN SODIUM (PORCINE) 1000 UNIT/ML DIALYSIS: 40.0000 [IU]/kg | INTRAMUSCULAR | Status: DC | PRN

## 2015-03-10 NOTE — Progress Notes (Signed)
Initial Nutrition Assessment  DOCUMENTATION CODES:   Not applicable  INTERVENTION:  -Provide snacks in between meals -Provide Prostat BID, 100 kcal, 15 g of protein   NUTRITION DIAGNOSIS:   Increased nutrient needs related to chronic illness as evidenced by estimated needs.  GOAL:   Patient will meet greater than or equal to 90% of their needs  MONITOR:   PO intake, Weight trends, Labs, Skin, I & O's  REASON FOR ASSESSMENT:   Malnutrition Screening Tool   ASSESSMENT:   45 year old male with history of ESRD, schizophrenia, diabetes mellitus, presented for acute respiratory failure. He did undergo dialysis on 03/08/2015. History status as appear to be improving. Patient also being treated for pneumonia.  Pt seen for MST. Pt reports good appetite, he is consuming 100% of meals. Pt states that he is still hungry and request snacks. Will order snacks for in between meals and Prostat BID to meet protein needs.  Pt reports that he has no weight loss. Per chart pt has lost 9 lb (6%) within two months, which is significant for time frame.  Conducted a nutrition focused physical exam, signs of muscle depletion. However, pt with a history of quadriparesis.   Reviewed medications. Reviewed labs: CBG 215-296   Diet Order:  Diet heart healthy/carb modified Room service appropriate?: Yes; Fluid consistency:: Thin  Skin:  Reviewed, no issues  Last BM:  03/09/15  Height:   Ht Readings from Last 1 Encounters:  03/08/15 6\' 1"  (1.854 m)    Weight:   Wt Readings from Last 1 Encounters:  03/09/15 154 lb 8.7 oz (70.1 kg)    Ideal Body Weight:  83.6 kg  BMI:  Body mass index is 20.39 kg/(m^2).  Estimated Nutritional Needs:   Kcal:  2100-2200  Protein:  105-115  Fluid:  per MD  EDUCATION NEEDS:   No education needs identified at this time  Raford Pitcher, Dietetic Intern Pager: [phone]

## 2015-03-10 NOTE — Progress Notes (Signed)
Patient ID: [name], male   DOB: Nov 07, 1970, 45 y.o.   MRN: [id number]  [facility] Progress Note   Assessment/ Plan:   1. Healthcare associated pneumonia: Continues to show clinical improvement on broad-spectrum antibiotic therapy for healthcare associated pneumonia. Improving leukocytosis and he remains afebrile without emergent markers of sepsis. 2. End-stage renal disease: On schedule for hemodialysis later today to continue his outpatient Monday/Wednesday/Friday schedule. 3. Anemia of chronic kidney disease: Continue Aranesp for anemia management of ESRD. 4. Metabolic bone disease: continue Renvela- phosphorus level to be rechecked again today to help guide further adjustments of therapy. Continue Sensipar for PTH suppression. 5. Hypertension: Blood pressures are acceptable, continue current antihypertensive therapy and attempt ultrafiltration with hemodialysis.  Subjective:   Reports improvement in his shortness of breath-denies any other complaints.    Objective:   BP 149/79 mmHg  Pulse 85  Temp(Src) 97.6 F (36.4 C) (Oral)  Resp 20  Ht 6\' 1"  (1.854 m)  Wt 70.1 kg (154 lb 8.7 oz)  BMI 20.39 kg/m2  SpO2 98%  Physical Exam: Gen: Comfortably resting in bed.  CVS: Pulse regular in rate and rhythm, S1 and S2 normal Resp: Decreased breath sounds over bases otherwise clear to auscultation Abd: Soft, flat, nontender Ext: Trace ankle/leg edema with persistent erythema right leg  Labs: BMET  Recent Labs Lab 03/08/15 1014 03/08/15 1028 03/09/15 0306 03/10/15 0555  NA 140 139 137 139  K 5.4* 5.2* 4.3 4.6  CL 103 106 96* 102  CO2 20*  --  21* 26  GLUCOSE 242* 244* 324* 161*  BUN 66* 59* 38* 57*  CREATININE 6.99* 6.50* 4.58* 6.08*  CALCIUM 9.3  --  9.1 8.6*   CBC  Recent Labs Lab 03/08/15 1014 03/08/15 1028 03/09/15 0306 03/10/15 0555  WBC 16.7*  --  12.8* 9.6  NEUTROABS 14.8*  --   --   --   HGB 10.3* 11.9* 9.5* 10.2*  HCT 32.0* 35.0*  30.2* 31.6*  MCV 100.9*  --  99.7 99.4  PLT 130*  --  144* 132*   Medications:    . albuterol  2.5 mg Nebulization BID  . amLODipine  10 mg Oral QHS  . atropine  1 drop Left Eye Daily  . brimonidine  1 drop Left Eye TID  . ceFEPime (MAXIPIME) IV  2 g Intravenous Q M,W,F-HD  . cinacalcet  30 mg Oral Once per day on Tue Sat  . cloNIDine  0.1 mg Oral TID  . heparin  5,000 Units Subcutaneous 3 times per day  . insulin aspart  0-5 Units Subcutaneous QHS  . insulin aspart  0-9 Units Subcutaneous TID WC  . insulin aspart  5 Units Subcutaneous TID WC  . insulin glargine  10 Units Subcutaneous QHS  . labetalol  100 mg Oral BID  . multivitamin  1 tablet Oral QHS  . pantoprazole  40 mg Oral Daily  . prednisoLONE acetate  1 drop Left Eye BID  . risperiDONE  2 mg Oral BID  . senna-docusate  1 tablet Oral BID  . sevelamer carbonate  800 mg Oral TID WC  . simvastatin  20 mg Oral QHS  . sodium chloride flush  3 mL Intravenous Q12H  . temazepam  15 mg Oral QHS  . vancomycin  750 mg Intravenous Q M,W,F-HD   Elmarie Shiley, MD 03/10/2015, 10:30 AM

## 2015-03-10 NOTE — Progress Notes (Signed)
[facility]                                                                              Patient Demographics  [name], is a 45 y.o. male, DOB - 1970-11-08, [id number]  Admit date - 03/08/2015   Admitting Physician Waldemar Dickens, MD  Outpatient Primary MD for the patient is Maggie Font, MD  LOS - 2   Chief Complaint  Patient presents with  . Chest Pain      Interim history 45 year old male with history of ESRD, schizophrenia, diabetes mellitus, presented for acute respiratory failure. He did undergo dialysis on 03/08/2015. History status as appear to be improving. Patient also being treated for pneumonia.  Assessment & Plan   Acute respiratory failure with hypoxia -Likely secondary to left upper lobe pneumonia -Chest x-ray showed small effusions and by basilar airspace disease -Positive admission, oxygen saturations were 80% on room air -Currently oxygenation in the upper 90s and 1.5 L nasal cannula -Successfully weaned off of O2, currently on room air  HCAP -Chest x-ray: Left upper lobe pneumonia -Leukocytosis resolved -Continue vancomycin and cefepime -Blood cultures show no growth to date  Pleuritic chest pain -Resolved -EKG showed sinus rhythm and no acute changes, troponins cycled and negative  End-stage renal disease -Patient dialyzes Monday, Wednesday, Friday -Nephrology consulted and appreciated -Scheduled for HD today  Hyperkalemia -Likely secondary to ESRD -Resolved, continue monitor BMP  Anemia secondary to chronic disease -Hemoglobin currently 10.2, appears to be close to baseline -Continue monitor CBC  Essential hypertension -Continue amlodipine, clonidine, labetalol  Hypothyroidism -Currently on no home medications -TSH on 02/24/2015 was 4.5, free T4 0.78  Diabetes mellitus, type II -Continue Lantus, ISS, CBG monitoring -Hemoglobin A1c 02/24/2015 was 6.4 -Increased Lantus to 10u and added Novolog 5u  TIDWC  Thrombocytopenia -Platelets currently 132, improved from admission -Continue to monitor CBC  Schizophrenia -Continue Risperdal  Hyperlipidemia -Continue statin  Code Status: Full  Family Communication: None at bedside  Disposition Plan: Admitted. Continue to monitor. Pending HD today, PT/OT eval.  Time Spent in minutes   30 minutes  Procedures  None  Consults   Nephrology   DVT Prophylaxis  heparin  Lab Results  Component Value Date   PLT 132* 03/10/2015    Medications  Scheduled Meds: . albuterol  2.5 mg Nebulization BID  . amLODipine  10 mg Oral QHS  . atropine  1 drop Left Eye Daily  . brimonidine  1 drop Left Eye TID  . ceFEPime (MAXIPIME) IV  2 g Intravenous Q M,W,F-HD  . cinacalcet  30 mg Oral Once per day on Tue Sat  . cloNIDine  0.1 mg Oral TID  . heparin  5,000 Units Subcutaneous 3 times per day  . insulin aspart  0-5 Units Subcutaneous QHS  . insulin aspart  0-9 Units Subcutaneous TID WC  . insulin aspart  5 Units Subcutaneous TID WC  . insulin glargine  10 Units Subcutaneous QHS  . labetalol  100 mg Oral BID  . multivitamin  1 tablet Oral QHS  . pantoprazole  40 mg Oral Daily  . prednisoLONE acetate  1 drop Left Eye BID  . risperiDONE  2  mg Oral BID  . senna-docusate  1 tablet Oral BID  . sevelamer carbonate  800 mg Oral TID WC  . simvastatin  20 mg Oral QHS  . sodium chloride flush  3 mL Intravenous Q12H  . temazepam  15 mg Oral QHS  . vancomycin  750 mg Intravenous Q M,W,F-HD   Continuous Infusions:  PRN Meds:.sodium chloride, sodium chloride, sodium chloride, acetaminophen **OR** acetaminophen, alteplase, bisacodyl, gi cocktail, heparin, HYDROcodone-acetaminophen, lidocaine (PF), lidocaine-prilocaine, LORazepam, morphine injection, ondansetron **OR** ondansetron (ZOFRAN) IV, pentafluoroprop-tetrafluoroeth, sodium chloride flush  Antibiotics    Anti-infectives    Start     Dose/Rate Route Frequency Ordered Stop   03/10/15 1200   ceFEPIme (MAXIPIME) 2 g in dextrose 5 % 50 mL IVPB     2 g 100 mL/hr over 30 Minutes Intravenous Every M-W-F (Hemodialysis) 03/08/15 1240     03/10/15 1200  vancomycin (VANCOCIN) IVPB 750 mg/150 ml premix     750 mg 150 mL/hr over 60 Minutes Intravenous Every M-W-F (Hemodialysis) 03/08/15 1240     03/08/15 1800  ceFEPIme (MAXIPIME) 1 g in dextrose 5 % 50 mL IVPB    Comments:  Please give at end of dialysis session on 1/30.   1 g 100 mL/hr over 30 Minutes Intravenous To Hemodialysis 03/08/15 1240 03/08/15 1747   03/08/15 1800  vancomycin (VANCOCIN) IVPB 750 mg/150 ml premix     750 mg 150 mL/hr over 60 Minutes Intravenous To Hemodialysis 03/08/15 1240 03/08/15 1819   03/08/15 1400  ceFEPIme (MAXIPIME) 1 g in dextrose 5 % 50 mL IVPB  Status:  Discontinued     1 g 100 mL/hr over 30 Minutes Intravenous 3 times per day 03/08/15 1203 03/08/15 1231   03/08/15 1000  ceFEPIme (MAXIPIME) 2 g in dextrose 5 % 50 mL IVPB     2 g 100 mL/hr over 30 Minutes Intravenous  Once 03/08/15 0955 03/08/15 1152   03/08/15 1000  vancomycin (VANCOCIN) 1,500 mg in sodium chloride 0.9 % 500 mL IVPB     1,500 mg 250 mL/hr over 120 Minutes Intravenous  Once 03/08/15 0955 03/08/15 1400      Subjective:   [name] seen and examined today.  Denies any shortness breath but has some cough.  Denies chest pain, dull pain, nausea or vomiting.  Complains of feeling weak.  Objective:   Filed Vitals:   03/09/15 1522 03/09/15 1526 03/09/15 2114 03/10/15 0417  BP: 144/82  141/75 138/64  Pulse: 85  82 80  Temp: 97.8 F (36.6 C)  98.2 F (36.8 C) 98.5 F (36.9 C)  TempSrc: Axillary  Axillary Axillary  Resp:   18 19  Height:      Weight:      SpO2: 97% 98% 96% 97%    Wt Readings from Last 3 Encounters:  03/09/15 70.1 kg (154 lb 8.7 oz)  02/26/15 73.3 kg (161 lb 9.6 oz)  01/17/11 74 kg (163 lb 2.3 oz)     Intake/Output Summary (Last 24 hours) at 03/10/15 0740 Last data filed at 03/10/15 0557  Gross per  24 hour  Intake   1422 ml  Output      0 ml  Net   1422 ml    Exam  General: Well developed, well nourished, NAD  HEENT: NCAT,  mucous membranes moist.   Cardiovascular: S1 S2 auscultated, RRR  Respiratory: Diminished breath sounds however clear  Abdomen: Soft, nontender, nondistended, + bowel sounds  Extremities: warm dry without cyanosis clubbing. Trace LE  edema B/L  Neuro: AAOx3, very cooperative with neurological exam.  Skin: Erythema right lower extremity.  Data Review   Micro Results Recent Results (from the past 240 hour(s))  Blood Culture (routine x 2)     Status: None (Preliminary result)   Collection Time: 03/08/15 10:14 AM  Result Value Ref Range Status   Specimen Description BLOOD LEFT ANTECUBITAL  Final   Special Requests BOTTLES DRAWN AEROBIC AND ANAEROBIC 5CCS  Final   Culture NO GROWTH 1 DAY  Final   Report Status PENDING  Incomplete  Blood Culture (routine x 2)     Status: None (Preliminary result)   Collection Time: 03/08/15 10:39 AM  Result Value Ref Range Status   Specimen Description BLOOD LEFT HAND  Final   Special Requests BOTTLES DRAWN AEROBIC AND ANAEROBIC 5CCS  Final   Culture NO GROWTH 1 DAY  Final   Report Status PENDING  Incomplete  MRSA PCR Screening     Status: None   Collection Time: 03/08/15  7:46 PM  Result Value Ref Range Status   MRSA by PCR NEGATIVE NEGATIVE Final    Comment:        The GeneXpert MRSA Assay (FDA approved for NASAL specimens only), is one component of a comprehensive MRSA colonization surveillance program. It is not intended to diagnose MRSA infection nor to guide or monitor treatment for MRSA infections.     Radiology Reports Dg Chest 2 View  02/24/2015  CLINICAL DATA:  Constipation, weakness, quadriplegia EXAM: CHEST  2 VIEW COMPARISON:  11/24/2010 FINDINGS: Cardiac size is stable. No acute infiltrate or pulmonary edema. Mild basilar atelectasis. There is soft tissue prominence right paratracheal  region. Mass or adenopathy cannot be excluded. Further correlation with enhanced CT scan of the chest is recommended. IMPRESSION: No infiltrate or pulmonary edema. There is soft tissue prominence in right upper paratracheal region. Mass or adenopathy cannot be excluded. Further correlation with CT scan of the chest is recommended. Electronically Signed   By: Lahoma Crocker M.D.   On: 02/24/2015 18:21   Abd 1 View (kub)  02/24/2015  CLINICAL DATA:  Constipation starting last night, quadriparesis EXAM: ABDOMEN - 1 VIEW COMPARISON:  None. FINDINGS: Minimal gaseous distended small bowel loops lower abdomen without air-fluid levels. Abundant stool noted in right colon. Moderate stool and gas noted in descending colon and sigmoid colon. Atherosclerotic calcifications of femoral and iliac arteries. IMPRESSION: No evidence of small bowel obstruction. Colonic stool as described above. Electronically Signed   By: Lahoma Crocker M.D.   On: 02/24/2015 20:05   Ct Chest Wo Contrast  02/25/2015  CLINICAL DATA:  Dialysis patient. Nonverbal. Soft tissue prominence in the RIGHT upper paratracheal region. EXAM: CT CHEST WITHOUT CONTRAST TECHNIQUE: Multidetector CT imaging of the chest was performed following the standard protocol without IV contrast. COMPARISON:  Chest radiograph 02/24/2015 FINDINGS: Mediastinum/Nodes: No axillary supraclavicular adenopathy. Normal thyroid gland. No mediastinal hilar adenopathy. No pericardial fluid. Coronary artery calcifications are present. Knee RIGHT paratracheal density on comparison radiograph corresponds to an ectatic superior vena cava. Lungs/Pleura: There bilateral pleural effusions. There is mild airspace disease in the RIGHT lower lobe (image 42, series 3) airways are normal. Upper abdomen: Limited view of the liver, kidneys, pancreas are unremarkable. Normal adrenal glands. Mild airspace disease in the LEFT lower lobe to a lesser degree. Musculoskeletal: Diffuse sclerosis of the bones.  No  acute findings. IMPRESSION: 1. Ectatic superior vena cava corresponds to the plain film abnormality, a benign finding. 2. Bilateral pleural  effusions. 3. Mild airspace disease in the LEFT and RIGHT lower lobe consistent with atelectasis, pneumonia, or aspiration. Electronically Signed   By: Suzy Bouchard M.D.   On: 02/25/2015 14:17   Dg Chest Port 1 View  03/08/2015  CLINICAL DATA:  Hypoxia.  Fever. EXAM: PORTABLE CHEST - 1 VIEW COMPARISON:  One-view chest x-ray 02/24/2015 and CT chest 02/25/2015. FINDINGS: The heart is mildly enlarged. New left upper lobe pneumonia is present. There is some airspace disease at the right base the there is some airspace disease at the lung bases bilaterally. Small effusions still persist. IMPRESSION: 1. New left upper lobe pneumonia. 2. Small effusions and bibasilar airspace disease. While this may reflect atelectasis, infection is not excluded. Electronically Signed   By: San Morelle M.D.   On: 03/08/2015 10:26    CBC  Recent Labs Lab 03/08/15 1014 03/08/15 1028 03/09/15 0306 03/10/15 0555  WBC 16.7*  --  12.8* 9.6  HGB 10.3* 11.9* 9.5* 10.2*  HCT 32.0* 35.0* 30.2* 31.6*  PLT 130*  --  144* 132*  MCV 100.9*  --  99.7 99.4  MCH 32.5  --  31.4 32.1  MCHC 32.2  --  31.5 32.3  RDW 19.1*  --  18.7* 18.2*  LYMPHSABS 0.9  --   --   --   MONOABS 0.9  --   --   --   EOSABS 0.0  --   --   --   BASOSABS 0.0  --   --   --     Chemistries   Recent Labs Lab 03/08/15 1014 03/08/15 1028 03/08/15 1217 03/09/15 0306 03/10/15 0555  NA 140 139  --  137 139  K 5.4* 5.2*  --  4.3 4.6  CL 103 106  --  96* 102  CO2 20*  --   --  21* 26  GLUCOSE 242* 244*  --  324* 161*  BUN 66* 59*  --  38* 57*  CREATININE 6.99* 6.50*  --  4.58* 6.08*  CALCIUM 9.3  --   --  9.1 8.6*  MG  --   --  2.2  --   --   AST 30  --   --  27  --   ALT 15*  --   --  15*  --   ALKPHOS 155*  --   --  144*  --   BILITOT 1.0  --   --  0.9  --     ------------------------------------------------------------------------------------------------------------------ estimated creatinine clearance is 15.5 mL/min (by C-G formula based on Cr of 6.08). ------------------------------------------------------------------------------------------------------------------ No results for input(s): HGBA1C in the last 72 hours. ------------------------------------------------------------------------------------------------------------------ No results for input(s): CHOL, HDL, LDLCALC, TRIG, CHOLHDL, LDLDIRECT in the last 72 hours. ------------------------------------------------------------------------------------------------------------------ No results for input(s): TSH, T4TOTAL, T3FREE, THYROIDAB in the last 72 hours.  Invalid input(s): FREET3 ------------------------------------------------------------------------------------------------------------------ No results for input(s): VITAMINB12, FOLATE, FERRITIN, TIBC, IRON, RETICCTPCT in the last 72 hours.  Coagulation profile  Recent Labs Lab 03/08/15 1014  INR 1.34    No results for input(s): DDIMER in the last 72 hours.  Cardiac Enzymes  Recent Labs Lab 03/08/15 1217 03/08/15 2132  TROPONINI <0.03 0.03   ------------------------------------------------------------------------------------------------------------------ Invalid input(s): POCBNP    Deneene Tarver D.O. on 03/10/2015 at 7:40 AM  Between 7am to 7pm - Pager - [phone]  After 7pm go to [email or web address] - password [email or web address]  And look for the night coverage person covering for me after hours  [facility] Office  [phone]

## 2015-03-10 NOTE — Procedures (Signed)
Patient seen on Hemodialysis. QB 400, UF goal 2L Treatment adjusted as needed.  Elmarie Shiley MD [facility]. Office # [phone] Pager # [phone] 3:30 PM

## 2015-03-11 DIAGNOSIS — J189 Pneumonia, unspecified organism: Principal | ICD-10-CM

## 2015-03-11 DIAGNOSIS — J9601 Acute respiratory failure with hypoxia: Secondary | ICD-10-CM

## 2015-03-11 DIAGNOSIS — N186 End stage renal disease: Secondary | ICD-10-CM

## 2015-03-11 DIAGNOSIS — D696 Thrombocytopenia, unspecified: Secondary | ICD-10-CM

## 2015-03-11 DIAGNOSIS — Z992 Dependence on renal dialysis: Secondary | ICD-10-CM

## 2015-03-11 LAB — CBC
HCT: 34.5 % — ABNORMAL LOW (ref 39.0–52.0)
Hemoglobin: 11 g/dL — ABNORMAL LOW (ref 13.0–17.0)
MCH: 31.6 pg (ref 26.0–34.0)
MCHC: 31.9 g/dL (ref 30.0–36.0)
MCV: 99.1 fL (ref 78.0–100.0)
Platelets: 147 10*3/uL — ABNORMAL LOW (ref 150–400)
RBC: 3.48 MIL/uL — ABNORMAL LOW (ref 4.22–5.81)
RDW: 17.7 % — ABNORMAL HIGH (ref 11.5–15.5)
WBC: 5.6 10*3/uL (ref 4.0–10.5)

## 2015-03-11 LAB — BASIC METABOLIC PANEL
Anion gap: 13 (ref 5–15)
BUN: 36 mg/dL — ABNORMAL HIGH (ref 6–20)
CO2: 27 mmol/L (ref 22–32)
Calcium: 9 mg/dL (ref 8.9–10.3)
Chloride: 97 mmol/L — ABNORMAL LOW (ref 101–111)
Creatinine, Ser: 4.09 mg/dL — ABNORMAL HIGH (ref 0.61–1.24)
GFR calc Af Amer: 19 mL/min — ABNORMAL LOW (ref >60)
GFR calc non Af Amer: 16 mL/min — ABNORMAL LOW (ref >60)
Glucose, Bld: 311 mg/dL — ABNORMAL HIGH (ref 65–99)
Potassium: 4.3 mmol/L (ref 3.5–5.1)
Sodium: 137 mmol/L (ref 135–145)

## 2015-03-11 LAB — GLUCOSE, CAPILLARY
Glucose-Capillary: 276 mg/dL — ABNORMAL HIGH (ref 65–99)
Glucose-Capillary: 296 mg/dL — ABNORMAL HIGH (ref 65–99)

## 2015-03-11 LAB — PHOSPHORUS: Phosphorus: 2.3 mg/dL — ABNORMAL LOW (ref 2.5–4.6)

## 2015-03-11 MED ORDER — VANCOMYCIN HCL IN DEXTROSE 750-5 MG/150ML-% IV SOLN: 750.0000 mg | INTRAVENOUS | Status: DC

## 2015-03-11 MED ORDER — INSULIN GLARGINE 100 UNIT/ML ~~LOC~~ SOLN: 12.0000 [IU] | Freq: Every day | SUBCUTANEOUS | Status: DC

## 2015-03-11 MED ORDER — DEXTROSE 5 % IV SOLN: 2.0000 g | Freq: Once | INTRAVENOUS | Status: DC

## 2015-03-11 NOTE — Progress Notes (Signed)
Report given to Zenda Alpers.  All questions answered.

## 2015-03-11 NOTE — Progress Notes (Signed)
Patient ID: CANTRELL OCHSNER, male   DOB: 1970-12-09, 45 y.o.   MRN: [id number]  [facility] Progress Note   Assessment/ Plan:   1. Healthcare associated pneumonia: Appears to be progressively improvement with resolution of leukocytosis and being afebrile . Currently on vancomycin and cefepime. 2. End-stage renal disease: Status post a hemodialysis session again yesterday. Continue with  Monday/Wednesday/Friday HD.  3. Anemia of chronic kidney disease: Hemoglobin improving with ESA 4. Metabolic bone disease: continue Renvela and Sensipar for PTH suppression, dosages and frequency confirmed with outpatient clinic. . 5. Hypertension: Blood pressures are acceptable, continue current antihypertensive therapy and attempt ultrafiltration with hemodialysis.  Subjective:   Patient doing well. Denies SOB or chest pain. No other complaints. Sleeping comfortably.    Objective:   BP 172/95 mmHg  Pulse 90  Temp(Src) 97.9 F (36.6 C) (Oral)  Resp 18  Ht 6\' 1"  (1.854 m)  Wt 157 lb 13.6 oz (71.6 kg)  BMI 20.83 kg/m2  SpO2 99%  Physical Exam: Gen: Sleepigng resting in bed-listening to television CVS: RRR, no m/r/g noted.  Resp: Diminished breath sounds, otherwise CTAB.  Abd: Soft, flat, nontender Ext: Trace ankle edema with persistent, mild erythema right leg.   Labs: BMET  Recent Labs Lab 03/08/15 1014 03/08/15 1028 03/09/15 0306 03/10/15 0555 03/11/15 0520  NA 140 139 137 139 137  K 5.4* 5.2* 4.3 4.6 4.3  CL 103 106 96* 102 97*  CO2 20*  --  21* 26 27  GLUCOSE 242* 244* 324* 161* 311*  BUN 66* 59* 38* 57* 36*  CREATININE 6.99* 6.50* 4.58* 6.08* 4.09*  CALCIUM 9.3  --  9.1 8.6* 9.0   CBC  Recent Labs Lab 03/08/15 1014 03/08/15 1028 03/09/15 0306 03/10/15 0555 03/11/15 0520  WBC 16.7*  --  12.8* 9.6 5.6  NEUTROABS 14.8*  --   --   --   --   HGB 10.3* 11.9* 9.5* 10.2* 11.0*  HCT 32.0* 35.0* 30.2* 31.6* 34.5*  MCV 100.9*  --  99.7 99.4 99.1  PLT 130*  --   144* 132* 147*   Medications:    . albuterol  2.5 mg Nebulization BID  . amLODipine  10 mg Oral QHS  . atropine  1 drop Left Eye Daily  . brimonidine  1 drop Left Eye TID  . ceFEPime (MAXIPIME) IV  2 g Intravenous Q M,W,F-HD  . cinacalcet  30 mg Oral Once per day on Tue Sat  . cloNIDine  0.1 mg Oral TID  . feeding supplement (PRO-STAT SUGAR FREE 64)  30 mL Oral BID  . heparin  5,000 Units Subcutaneous 3 times per day  . insulin aspart  0-5 Units Subcutaneous QHS  . insulin aspart  0-9 Units Subcutaneous TID WC  . insulin aspart  5 Units Subcutaneous TID WC  . insulin glargine  10 Units Subcutaneous QHS  . labetalol  100 mg Oral BID  . multivitamin  1 tablet Oral QHS  . pantoprazole  40 mg Oral Daily  . prednisoLONE acetate  1 drop Left Eye BID  . risperiDONE  2 mg Oral BID  . senna-docusate  1 tablet Oral BID  . sevelamer carbonate  800 mg Oral TID WC  . simvastatin  20 mg Oral QHS  . sodium chloride flush  3 mL Intravenous Q12H  . temazepam  15 mg Oral QHS  . vancomycin  750 mg Intravenous Q M,W,F-HD   Kathrine Cords, MD [place] PGY-2 03/11/2015, 7:30 AM

## 2015-03-11 NOTE — Progress Notes (Signed)
OT Cancellation Note  Patient Details Name: SHUHEI BALSAMO MRN: [id number] DOB: 1970/12/04   Cancelled Treatment:    Reason Eval/Treat Not Completed: Patient declined, no reason specified - Pt states he sat on EOB with nursing and is not going to get up now.  Caregiver not in room.   Will reattempt  Darlina Rumpf [place], OTR/L [id number]  03/11/2015, 10:04 AM

## 2015-03-11 NOTE — Progress Notes (Signed)
Pharmacy Antibiotic Note  [name] is a 45 y.o. male admitted on 03/08/2015 with sepsis.  Pharmacy has been consulted for Vanc/Cefepime dosing D#4 for HAP. Afeb, wbc wnl. Blood cultures are ngtd. ESRD - HD MWF (tolerating full sessions). Next HD 2/3 if remains inpatient.  Plan: Cefepime 2g IV QHD-MWF Vancomycin 750mg  IV QHD-MWF Monitor clinical picture, HD sessions, pre-HD VR prn F/U C&S, abx de-escalation / LOT  Height: 6\' 1"  (185.4 cm) Weight: 157 lb 13.6 oz (71.6 kg) IBW/kg (Calculated) : 79.9  Temp (24hrs), Avg:98.2 F (36.8 C), Min:97.9 F (36.6 C), Max:98.8 F (37.1 C)   Recent Labs Lab 03/08/15 1014 03/08/15 1028 03/08/15 1029 03/09/15 0306 03/10/15 0555 03/11/15 0520  WBC 16.7*  --   --  12.8* 9.6 5.6  CREATININE 6.99* 6.50*  --  4.58* 6.08* 4.09*  LATICACIDVEN  --   --  1.06  --   --   --     Estimated Creatinine Clearance: 23.6 mL/min (by C-G formula based on Cr of 4.09).    Allergies  Allergen Reactions  . Lactose Intolerance (Gi) Diarrhea  . Polymyxin B Swelling    "Can't take this eye drop," per outside records.   . Sulfamethoxazole Rash    "Swelling of area, face, from drop that had sulfa," per outside records.     Antimicrobials this admission: Vancomycin 1/30 >> Cefepime 1/30 >>  Dose adjustments this admission: n/a  Microbiology results: Blood x 2 cx 1/30 > ngtd MRSA pcr 1/30 > neg  Elicia Lamp, PharmD, [facility] Clinical Pharmacist Pager [phone] 03/11/2015 10:58 AM

## 2015-03-11 NOTE — Discharge Summary (Signed)
Physician Discharge Summary  [name] [id number] DOB: [birth year] DOA: 03/08/2015  PCP: Maggie Font, MD  Admit date: 03/08/2015 Discharge date: 03/11/2015  Recommendations for Outpatient Follow-up:  1. Pt will need to follow up with PCP in 2 weeks post discharge 2. Give ceftazidime 2 grams and vancomycin 750 mg on dialysis on 03/12/15--this will be his final dose of antibiotics  Discharge Diagnoses:  Acute respiratory failure with hypoxia -Likely secondary to left upper lobe pneumonia -Chest x-ray showed small effusions and by basilar airspace disease -Positive admission, oxygen saturations were 80% on room  -Successfully weaned off of O2, currently on room air--with saturation 99%  HCAP -Chest x-ray: Left upper lobe pneumonia -Leukocytosis resolved -Continue vancomycin and cefepime during the hospitalization -Blood cultures show no growth to date -pt will be given one final dose of vancomycin (750mg ) and ceftazidime (2 grams) on dialysis on 03/12/15 at his outpt dialysis center--these will remain in vivo until his HD on 03/15/15--this last dose will allow him to finish 7 days of abx -notified Alric Seton of pt's discharge and abx recommendations  Pleuritic chest pain -Resolved -EKG showed sinus rhythm and no acute changes, troponins cycled and negative  End-stage renal disease -Patient dialyzes Monday, Wednesday, Friday -Nephrology consulted and appreciated -Scheduled for HD today  Hyperkalemia -Likely secondary to ESRD -Resolved, continue monitor BMP  Anemia secondary to chronic disease -Hemoglobin currently 10.2, appears to be close to baseline -Continue monitor CBC -Hgb remained stable throughout the hospitalization  Essential hypertension -Continue amlodipine, clonidine, labetalol -restart hydralazine  Hypothyroidism? -Currently on no home medications -TSH on 02/24/2015 was 4.5, free T4 0.78--likely euthyroid sick -recheck in one  month  Diabetes mellitus, type II -Continue Lantus, ISS, CBG monitoring -Hemoglobin A1c 02/24/2015 was 6.4 -Increased Lantus to 12u and added Novolog 5u TIDWC  Thrombocytopenia -Platelets currently 132, improved from admission with treatment of infection -Continue to monitor CBC -no signs of bleeding -Hgb remained stable  Schizophrenia -Continue Risperdal  Hyperlipidemia -Continue statin  Discharge Condition: stable  Disposition: home  Diet:renal/carb modified Wt Readings from Last 3 Encounters:  03/11/15 71.6 kg (157 lb 13.6 oz)  02/26/15 73.3 kg (161 lb 9.6 oz)  01/17/11 74 kg (163 lb 2.3 oz)    History of present illness:  45 year old male with history of ESRD, schizophrenia, diabetes mellitus, presented with chest pain, shortness breath, and coughing. The patient was found to be hypoxemic with oxygen saturation in the 80s in the emergency department. Chest x-ray showed new left upper lobe infiltrate. The patient was recently discharged from the hospital 02/25/2015. The patient was started on vancomycin and cefepime for treatment of HCAP. He did undergo dialysis on 03/08/2015. The patient improved clinically. WBC improved. The patient was weaned off oxygen and remained stable on room air. Patient also being treated for pneumonia. PT and OT were ordered, but the patient refused. The patient's caretaker was notified and the patient be discharged home to his caretaker's care.  Consultants: nephrology  Discharge Exam: Filed Vitals:   03/11/15 0528 03/11/15 0957  BP: 172/95   Pulse: 90 94  Temp: 97.9 F (36.6 C)   Resp: 18 18   Filed Vitals:   03/10/15 2251 03/11/15 0500 03/11/15 0528 03/11/15 0957  BP:   172/95   Pulse:   90 94  Temp:   97.9 F (36.6 C)   TempSrc:   Oral   Resp:   18 18  Height:  6\' 1"  (1.854 m)    Weight: 72 kg (158  lb 11.7 oz) 71.6 kg (157 lb 13.6 oz)    SpO2:   99% 98%   General: A&O x 3, NAD, pleasant, cooperative Cardiovascular: RRR, no  rub, no gallop, no S3 Respiratory: Left-sided crackles and diminished breath sounds. No wheezing.R-chest clear to Auscultation. Abdomen:soft, nontender, nondistended, positive bowel sounds Extremities: No edema, No lymphangitis, no petechiae  Discharge Instructions      Discharge Instructions    Diet - low sodium heart healthy    Complete by:  As directed      Increase activity slowly    Complete by:  As directed             Medication List    STOP taking these medications        amoxicillin-clavulanate 250-125 MG tablet  Commonly known as:  AUGMENTIN      TAKE these medications        amLODipine 10 MG tablet  Commonly known as:  NORVASC  Take 1 tablet (10 mg total) by mouth at bedtime.     atropine 1 % ophthalmic solution  Place 1 drop into the left eye daily.     bisacodyl 10 MG suppository  Commonly known as:  DULCOLAX  Place 1 suppository (10 mg total) rectally daily as needed for moderate constipation.     brimonidine 0.1 % Soln  Commonly known as:  ALPHAGAN P  Place 1 drop into the left eye 3 (three) times daily.     cefTAZidime 2 g in dextrose 5 % 50 mL  Inject 2 g into the vein once. LAST dose on HD 03/12/15     ciclopirox 8 % solution  Commonly known as:  PENLAC  Apply 1 application topically at bedtime. Apply over nail and surrounding skin. Apply daily over previous coat. After seven (7) days, may remove with alcohol and continue cycle.     cinacalcet 30 MG tablet  Commonly known as:  SENSIPAR  Take 30 mg by mouth See admin instructions. Tuesday, Saturday     cloNIDine 0.1 MG tablet  Commonly known as:  CATAPRES  Take 1 tablet (0.1 mg total) by mouth 3 (three) times daily.     hydrALAZINE 100 MG tablet  Commonly known as:  APRESOLINE  Take 1 tablet (100 mg total) by mouth 3 (three) times daily.     insulin glargine 100 UNIT/ML injection  Commonly known as:  LANTUS  Inject 0.12 mLs (12 Units total) into the skin daily.     insulin lispro 100  UNIT/ML injection  Commonly known as:  HUMALOG  Inject 5 Units into the skin 3 (three) times daily before meals. Inject additional units for blood sugar levels that are 301-350=6 units, 351-400=8 units.     ketoconazole 2 % cream  Commonly known as:  NIZORAL  Apply 1 application topically 2 (two) times daily.     labetalol 100 MG tablet  Commonly known as:  NORMODYNE  Take 1 tablet (100 mg total) by mouth 2 (two) times daily.     LORazepam 1 MG tablet  Commonly known as:  ATIVAN  Take 1 mg by mouth every 8 (eight) hours as needed. For anxiety     multivitamin Tabs tablet  Take 1 tablet by mouth daily.     nitroGLYCERIN 0.4 MG SL tablet  Commonly known as:  NITROSTAT  Place 0.4 mg under the tongue every 5 (five) minutes as needed. For chest pain     pantoprazole 40 MG tablet  Commonly known  as:  PROTONIX  Take 40 mg by mouth daily.     prednisoLONE acetate 1 % ophthalmic suspension  Commonly known as:  PRED FORTE  Place 1 drop into the left eye 2 (two) times daily.     risperiDONE 2 MG tablet  Commonly known as:  RISPERDAL  Take 2 mg by mouth 2 (two) times daily.     senna-docusate 8.6-50 MG tablet  Commonly known as:  Senokot-S  Take 1 tablet by mouth 2 (two) times daily.     sevelamer carbonate 800 MG tablet  Commonly known as:  RENVELA  Take 800 mg by mouth 3 (three) times daily.     simvastatin 20 MG tablet  Commonly known as:  ZOCOR  Take 20 mg by mouth at bedtime.     temazepam 15 MG capsule  Commonly known as:  RESTORIL  Take 15 mg by mouth at bedtime.     Vancomycin 750 MG/150ML Soln  Commonly known as:  VANCOCIN  Inject 150 mLs (750 mg total) into the vein every Monday, Wednesday, and Friday with hemodialysis. LAST dose on HD 03/12/15         The results of significant diagnostics from this hospitalization (including imaging, microbiology, ancillary and laboratory) are listed below for reference.    Significant Diagnostic  Studies:    Microbiology: Recent Results (from the past 240 hour(s))  Blood Culture (routine x 2)     Status: None (Preliminary result)   Collection Time: 03/08/15 10:14 AM  Result Value Ref Range Status   Specimen Description BLOOD LEFT ANTECUBITAL  Final   Special Requests BOTTLES DRAWN AEROBIC AND ANAEROBIC 5CCS  Final   Culture NO GROWTH 2 DAYS  Final   Report Status PENDING  Incomplete  Blood Culture (routine x 2)     Status: None (Preliminary result)   Collection Time: 03/08/15 10:39 AM  Result Value Ref Range Status   Specimen Description BLOOD LEFT HAND  Final   Special Requests BOTTLES DRAWN AEROBIC AND ANAEROBIC 5CCS  Final   Culture NO GROWTH 2 DAYS  Final   Report Status PENDING  Incomplete  MRSA PCR Screening     Status: None   Collection Time: 03/08/15  7:46 PM  Result Value Ref Range Status   MRSA by PCR NEGATIVE NEGATIVE Final    Comment:        The GeneXpert MRSA Assay (FDA approved for NASAL specimens only), is one component of a comprehensive MRSA colonization surveillance program. It is not intended to diagnose MRSA infection nor to guide or monitor treatment for MRSA infections.      Labs: Basic Metabolic Panel:  Recent Labs Lab 03/08/15 1014 03/08/15 1028 03/08/15 1217 03/09/15 0306 03/10/15 0555 03/11/15 0520  NA 140 139  --  137 139 137  K 5.4* 5.2*  --  4.3 4.6 4.3  CL 103 106  --  96* 102 97*  CO2 20*  --   --  21* 26 27  GLUCOSE 242* 244*  --  324* 161* 311*  BUN 66* 59*  --  38* 57* 36*  CREATININE 6.99* 6.50*  --  4.58* 6.08* 4.09*  CALCIUM 9.3  --   --  9.1 8.6* 9.0  MG  --   --  2.2  --   --   --   PHOS  --   --   --   --   --  2.3*   Liver Function Tests:  Recent Labs Lab 03/08/15  1014 03/09/15 0306  AST 30 27  ALT 15* 15*  ALKPHOS 155* 144*  BILITOT 1.0 0.9  PROT 7.2 6.4*  ALBUMIN 3.4* 3.0*   No results for input(s): LIPASE, AMYLASE in the last 168 hours. No results for input(s): AMMONIA in the last 168  hours. CBC:  Recent Labs Lab 03/08/15 1014 03/08/15 1028 03/09/15 0306 03/10/15 0555 03/11/15 0520  WBC 16.7*  --  12.8* 9.6 5.6  NEUTROABS 14.8*  --   --   --   --   HGB 10.3* 11.9* 9.5* 10.2* 11.0*  HCT 32.0* 35.0* 30.2* 31.6* 34.5*  MCV 100.9*  --  99.7 99.4 99.1  PLT 130*  --  144* 132* 147*   Cardiac Enzymes:  Recent Labs Lab 03/08/15 1217 03/08/15 2132  TROPONINI <0.03 0.03   BNP: Invalid input(s): POCBNP CBG:  Recent Labs Lab 03/10/15 0727 03/10/15 1200 03/10/15 2153 03/11/15 0729 03/11/15 1204  GLUCAP 135* 155* 262* 276* 296*    Time coordinating discharge:  Greater than 30 minutes  Signed:  Shankar Silber, DO [facility] Pager: [phone] 03/11/2015, 1:02 PM

## 2015-03-11 NOTE — Clinical Social Work Note (Signed)
Patient discharged back to [facility] today, transported by [name], group home director.  Mr. Otilio Miu provided with FL-2 and d/c summary.  Erielle Gawronski Givens, MSW, LCSW Licensed Clinical Social Worker [place] [phone]

## 2015-03-11 NOTE — NC FL2 (Signed)
[place] LEVEL OF CARE SCREENING TOOL     IDENTIFICATION  Patient Name: [name] Birthdate: 12/12/1970 Sex: male Admission Date (Current Location): 03/08/2015  [place] and [place] Number:  Kathleen Argue [id number] [place] and Address:  The [place], [place] [address] [place], [place] [address]      Provider Number: [id number]  Attending Physician Name and Address:  Orson Eva, MD  Relative Name and Phone Number:  [name] - group home owner [phone])    Current Level of Care: Hospital Recommended Level of Care: Other (Comment) ([place]) Prior Approval Number:    Date Approved/Denied:   PASRR Number:    Discharge Plan: Other (Comment) (Group [place])    Current Diagnoses: Patient Active Problem List   Diagnosis Date Noted  . Acute respiratory failure with hypoxia ([place]) 03/08/2015  . HCAP (healthcare-associated pneumonia) 03/08/2015  . Chest pain 03/08/2015  . Quadriparesis (muscle weakness) ([place])   . ESRD (end stage renal disease) on dialysis ([place]) 02/24/2015  . Constipation, acute 02/24/2015  . Normocytic anemia 02/24/2015  . Occult GI bleeding 02/24/2015  . Hypertension 02/24/2015  . Thrombocytopenia ([place]) 02/24/2015  . Hypothermia 02/24/2015  . Elevated lactic acid level 02/24/2015  . Hyperthyroidism 02/24/2015  . Anemia due to GI blood loss 02/24/2015  . Schizophrenia ([place]) 02/24/2015  . Constipation   . Chronic osteomyelitis ([place]) 06/26/2013    Orientation RESPIRATION BLADDER Height & Weight     Self, Time, Situation, Place  Normal Incontinent Weight: 157 lb 13.6 oz (71.6 kg) Height:  6\' 1"  (185.4 cm)  BEHAVIORAL SYMPTOMS/MOOD NEUROLOGICAL BOWEL NUTRITION STATUS      Incontinent Diet (Heart healthy - carb modified)  AMBULATORY STATUS COMMUNICATION OF NEEDS Skin   Total Care (Patient has quadreparesis - in wheelchair) Verbally Normal                       Personal Care Assistance Level  of Assistance  Bathing, Feeding, Dressing Bathing Assistance: Maximum assistance Feeding assistance: Independent Dressing Assistance: Maximum assistance     Functional Limitations Info  Sight, Hearing, Speech Sight Info: Impaired Hearing Info: Adequate Speech Info: Adequate    SPECIAL CARE FACTORS FREQUENCY  PT (By licensed PT)     PT Frequency: Evaluated 03/09/15.              Contractures Contractures Info: Not present    Additional Factors Info  Code Status, Allergies Code Status Info: Full Code Allergies Info: Lactose Intolerance, Polymyxin B, Sulfamethoxazole           Current Medications (03/11/2015):  This is the current hospital active medication list Current Facility-Administered Medications  Medication Dose Route Frequency Provider Last Rate Last Dose  . 0.9 %  sodium chloride infusion  250 mL Intravenous PRN Lezlie Octave Black, NP      . 0.9 %  sodium chloride infusion  100 mL Intravenous PRN Elmarie Shiley, MD      . 0.9 %  sodium chloride infusion  100 mL Intravenous PRN Elmarie Shiley, MD      . acetaminophen (TYLENOL) tablet 650 mg  650 mg Oral Q6H PRN Radene Gunning, NP       Or  . acetaminophen (TYLENOL) suppository 650 mg  650 mg Rectal Q6H PRN Lezlie Octave Black, NP      . albuterol (PROVENTIL) (2.5 MG/3ML) 0.083% nebulizer solution 2.5 mg  2.5 mg Nebulization BID Maryann Mikhail, DO   2.5 mg  at 03/11/15 0956  . alteplase (CATHFLO ACTIVASE) injection 2 mg  2 mg Intracatheter Once PRN Elmarie Shiley, MD      . amLODipine (NORVASC) tablet 10 mg  10 mg Oral QHS Radene Gunning, NP   10 mg at 03/10/15 2215  . atropine 1 % ophthalmic solution 1 drop  1 drop Left Eye Daily Radene Gunning, NP   1 drop at 03/11/15 1018  . bisacodyl (DULCOLAX) suppository 10 mg  10 mg Rectal Daily PRN Radene Gunning, NP      . brimonidine (ALPHAGAN) 0.15 % ophthalmic solution 1 drop  1 drop Left Eye TID Radene Gunning, NP   1 drop at 03/11/15 1018  . ceFEPIme (MAXIPIME) 2 g in dextrose 5 % 50 mL IVPB  2 g  Intravenous Q M,W,F-HD Wynell Balloon, RPH   2 g at 03/10/15 1742  . cinacalcet (SENSIPAR) tablet 30 mg  30 mg Oral Once per day on Tue Sat Radene Gunning, NP   30 mg at 03/09/15 [id number]  . cloNIDine (CATAPRES) tablet 0.1 mg  0.1 mg Oral TID Radene Gunning, NP   0.1 mg at 03/11/15 1013  . feeding supplement (PRO-STAT SUGAR FREE 64) liquid 30 mL  30 mL Oral BID Dale Asbury, RD   30 mL at 03/10/15 1319  . gi cocktail (Maalox,Lidocaine,Donnatal)  30 mL Oral BID PRN Radene Gunning, NP      . heparin injection 1,000 Units  1,000 Units Dialysis PRN Elmarie Shiley, MD      . heparin injection 5,000 Units  5,000 Units Subcutaneous 3 times per day Radene Gunning, NP   5,000 Units at 03/11/15 0520  . HYDROcodone-acetaminophen (NORCO/VICODIN) 5-325 MG per tablet 1-2 tablet  1-2 tablet Oral Q4H PRN Radene Gunning, NP      . insulin aspart (novoLOG) injection 0-5 Units  0-5 Units Subcutaneous QHS Radene Gunning, NP   3 Units at 03/10/15 2230  . insulin aspart (novoLOG) injection 0-9 Units  0-9 Units Subcutaneous TID WC Radene Gunning, NP   5 Units at 03/11/15 1251  . insulin aspart (novoLOG) injection 5 Units  5 Units Subcutaneous TID WC Maryann Mikhail, DO   5 Units at 03/11/15 1252  . insulin glargine (LANTUS) injection 10 Units  10 Units Subcutaneous QHS Maryann Mikhail, DO   10 Units at 03/10/15 2218  . labetalol (NORMODYNE) tablet 100 mg  100 mg Oral BID Radene Gunning, NP   100 mg at 03/11/15 1013  . lidocaine (PF) (XYLOCAINE) 1 % injection 5 mL  5 mL Intradermal PRN Elmarie Shiley, MD      . lidocaine-prilocaine (EMLA) cream 1 application  1 application Topical PRN Elmarie Shiley, MD      . LORazepam (ATIVAN) tablet 1 mg  1 mg Oral Q6H PRN Radene Gunning, NP   1 mg at 03/10/15 2215  . morphine 2 MG/ML injection 1 mg  1 mg Intravenous Q3H PRN Radene Gunning, NP   1 mg at 03/09/15 0910  . multivitamin (RENA-VIT) tablet 1 tablet  1 tablet Oral QHS Radene Gunning, NP   1 tablet at 03/10/15 2214  . ondansetron (ZOFRAN) tablet 4  mg  4 mg Oral Q6H PRN Radene Gunning, NP       Or  . ondansetron [facility]) injection 4 mg  4 mg Intravenous Q6H PRN Radene Gunning, NP      . pantoprazole (PROTONIX) EC tablet  40 mg  40 mg Oral Daily Lezlie Octave Black, NP   40 mg at 03/11/15 1013  . pentafluoroprop-tetrafluoroeth (GEBAUERS) aerosol 1 application  1 application Topical PRN Elmarie Shiley, MD      . prednisoLONE acetate (PRED FORTE) 1 % ophthalmic suspension 1 drop  1 drop Left Eye BID Radene Gunning, NP   1 drop at 03/11/15 1016  . risperiDONE (RISPERDAL) tablet 2 mg  2 mg Oral BID Radene Gunning, NP   2 mg at 03/11/15 1013  . senna-docusate (Senokot-S) tablet 1 tablet  1 tablet Oral BID Radene Gunning, NP   1 tablet at 03/11/15 1013  . sevelamer carbonate (RENVELA) tablet 800 mg  800 mg Oral TID WC Radene Gunning, NP   800 mg at 03/11/15 1252  . simvastatin (ZOCOR) tablet 20 mg  20 mg Oral QHS Radene Gunning, NP   20 mg at 03/10/15 2216  . sodium chloride flush (NS) 0.9 % injection 3 mL  3 mL Intravenous Q12H Lezlie Octave Black, NP   3 mL at 03/11/15 1017  . sodium chloride flush (NS) 0.9 % injection 3 mL  3 mL Intravenous PRN Lezlie Octave Black, NP      . temazepam (RESTORIL) capsule 15 mg  15 mg Oral QHS Radene Gunning, NP   15 mg at 03/10/15 2216  . vancomycin (VANCOCIN) IVPB 750 mg/150 ml premix  750 mg Intravenous Q M,W,F-HD Wynell Balloon, RPH   750 mg at 03/10/15 1747     Discharge Medications: Please see discharge summary for a list of discharge medications.  Relevant Imaging Results:  Relevant Lab Results:   Additional Information [facility]ail[facility] MWF at [facility].  MEDICATIONS: amoxicillin-clavulanate 250-125 MG tablet. Take these medications: Amlodipine 1- MG tablet, atropine 1% ophthalmic solution, bisacodyl 1- MG suppository, brimonidine 0.1% Soln, ceftazidime 2 g in dextrose 5% 50 mL, ciclopirox 8% solution, cinacalcet 30 MG tablet, clonidine 0.1  MG tablet, hydralazine 100 MG tablet, insulin glargine 100 UNIT/ML injection, insulin  lispro 100 UNIT/ML injection, ketoconazole 2% cream, labetalol 100 mg tablet, lorazepam 1 mg tablet, multivitamin tablet, nitroglycerin 0.4 mg SL tablet, pantoprazole 40 mg tablet, prednisolone acetate 1% ophthalmic suspension, risperidone 2 mt tablet, senna-docusate 8.6-50 mg tablet,  sevelamer carbonate 800 mg tablet, simvastatin 20 mg tablet, temazepam 15 mg capsule, vancomycin 750 mg/150 jl Soln.  Sable Feil, LCSW

## 2015-03-12 NOTE — Progress Notes (Signed)
Called by pt's outpt dialysis center.  They did not have ceftazidime in stock to administer to the patient.  Pt received his vancomycin.  I have called in a prescription ([place]) for levofloxacin 750 mg, #1, to take today to finish his course of therapy.  DTat

## 2015-03-13 LAB — CULTURE, BLOOD (ROUTINE X 2)
Culture: NO GROWTH
Culture: NO GROWTH

## 2016-11-21 ENCOUNTER — Other Ambulatory Visit: Payer: Self-pay | Admitting: Family Medicine

## 2016-11-21 ENCOUNTER — Ambulatory Visit
Admission: RE | Admit: 2016-11-21 | Discharge: 2016-11-21 | Disposition: A | Discharge status: Home / Self Care | Payer: Medicaid Other | Source: Ambulatory Visit | Attending: Family Medicine | Admitting: Family Medicine

## 2016-11-21 DIAGNOSIS — K5900 Constipation, unspecified: Secondary | ICD-10-CM

## 2016-12-11 IMAGING — CT CT CHEST W/O CM
2 of 3 series · 15 of 36 positions shown, 18 images · non-contrast
Comparison: Chest radiograph 02/24/2015

CLINICAL DATA: Dialysis patient. Nonverbal. Soft tissue prominence
in the RIGHT upper paratracheal region.

EXAM:
CT CHEST WITHOUT CONTRAST
TECHNIQUE: Multidetector CT imaging of the chest was performed following the
standard protocol without IV contrast.

[Series 2: thorax 5.0 i31f 1 · axial · 0.73mm/px · z∈[-531,-281]mm · 12 of 60 slices shown, 15 images]
[im 5/60  mediastinal]
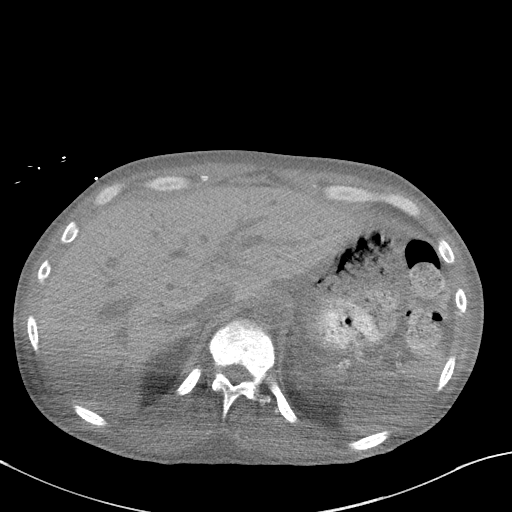
[im 5/60  lung]
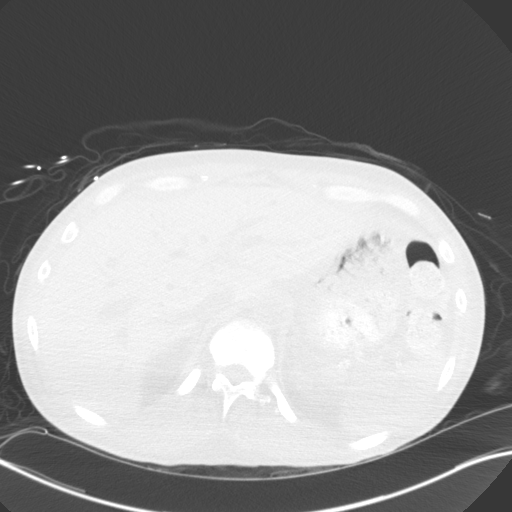
[im 9/60  lung]
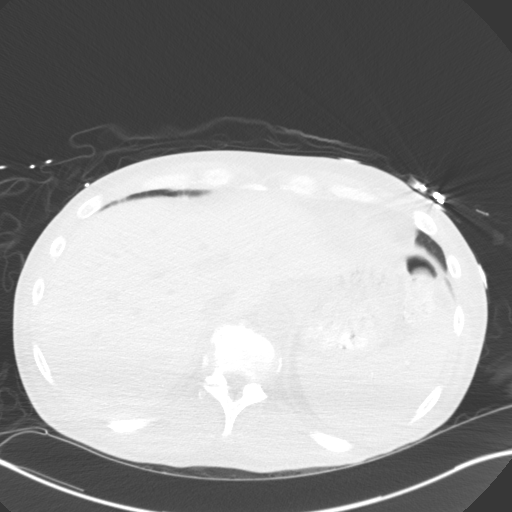
[im 14/60  lung]
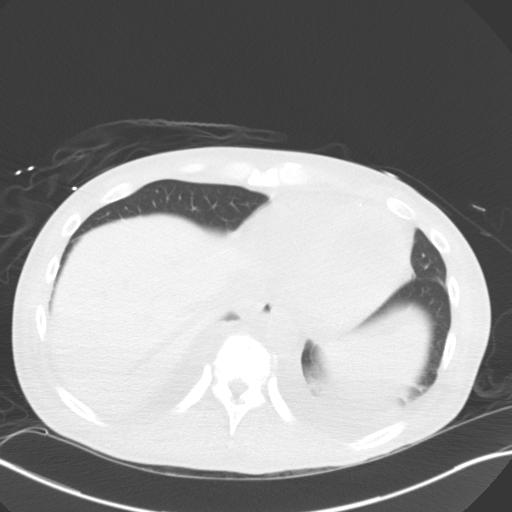
[im 18/60  lung]
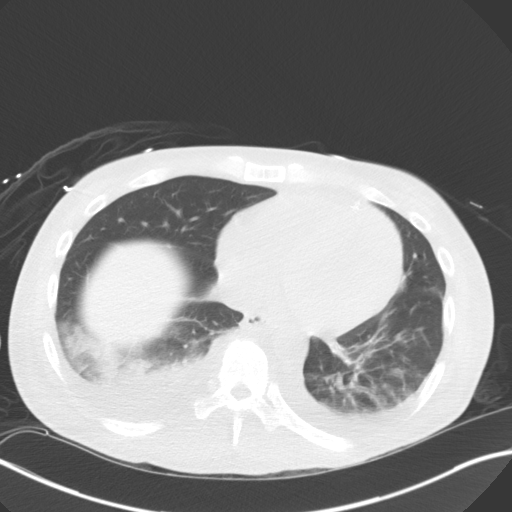
[im 22/60  mediastinal]
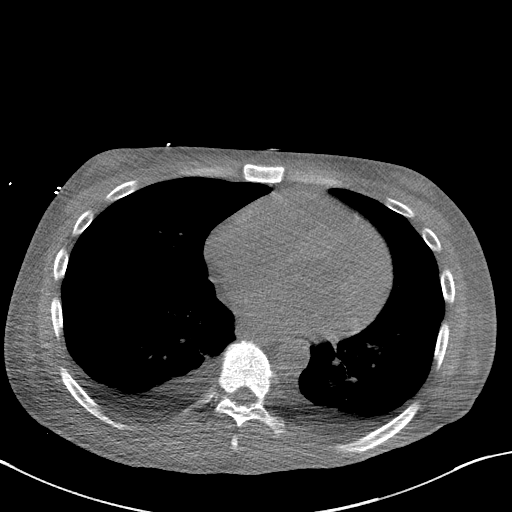
[im 22/60  lung]
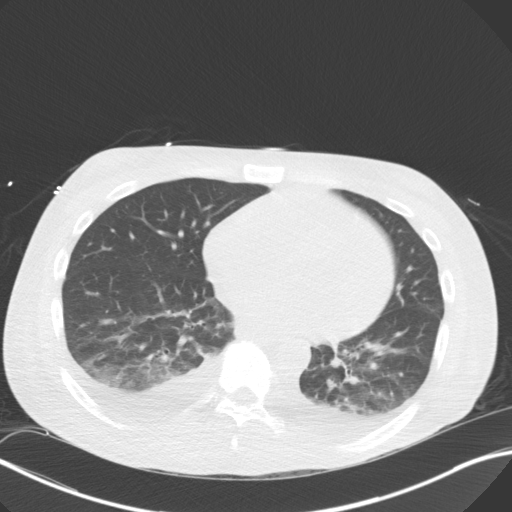
[im 27/60  lung]
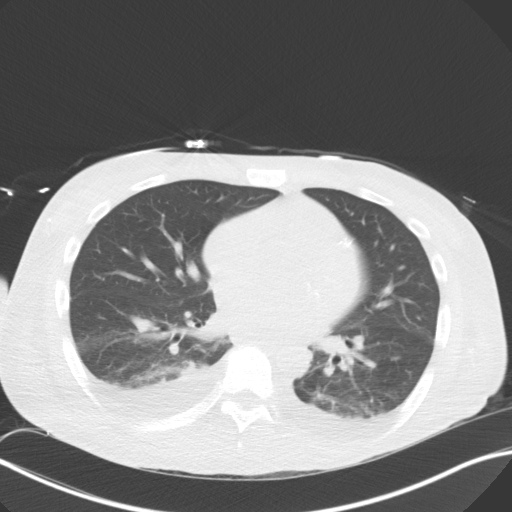
[im 33/60  lung]
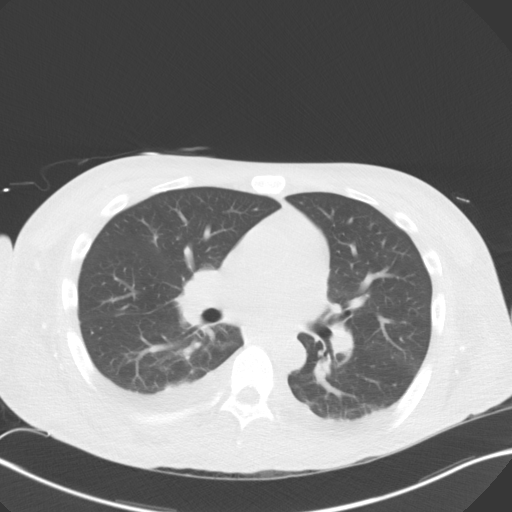
[im 38/60  lung]
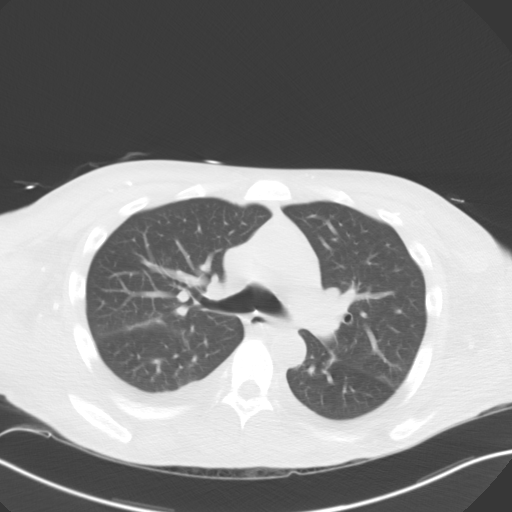
[im 42/60  mediastinal]
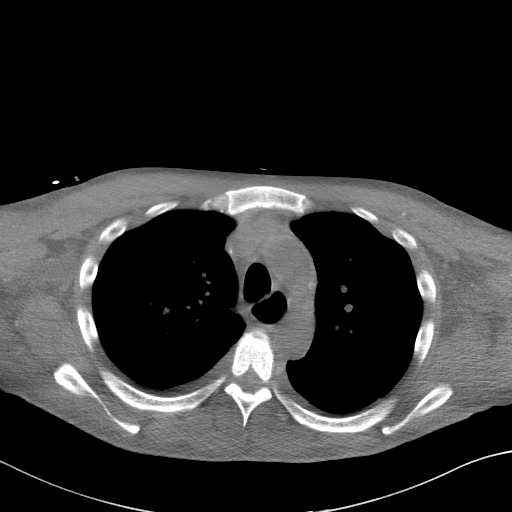
[im 42/60  lung]
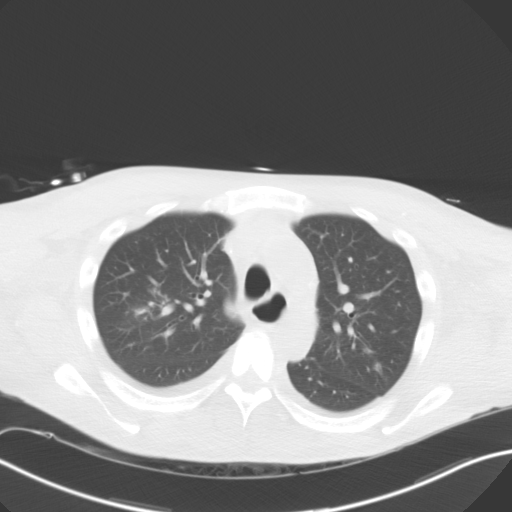
[im 46/60  lung]
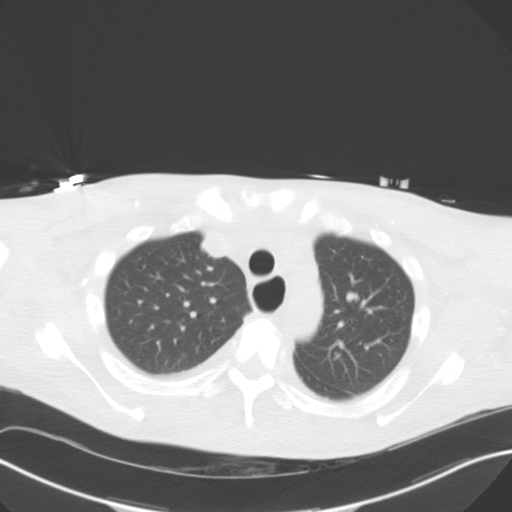
[im 51/60  lung]
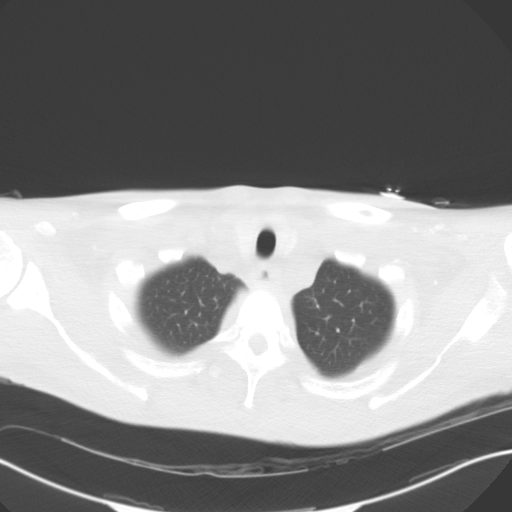
[im 55/60  lung]
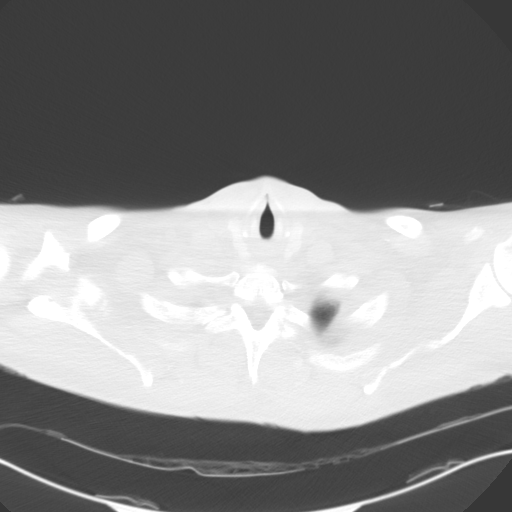

[Series 5: coronal · coronal · 0.59mm/px · 3 of 74 slices shown]
[im 15/74  lung]
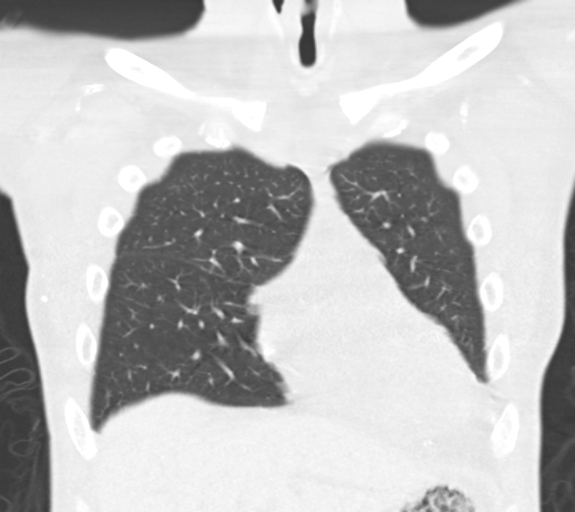
[im 30/74  lung]
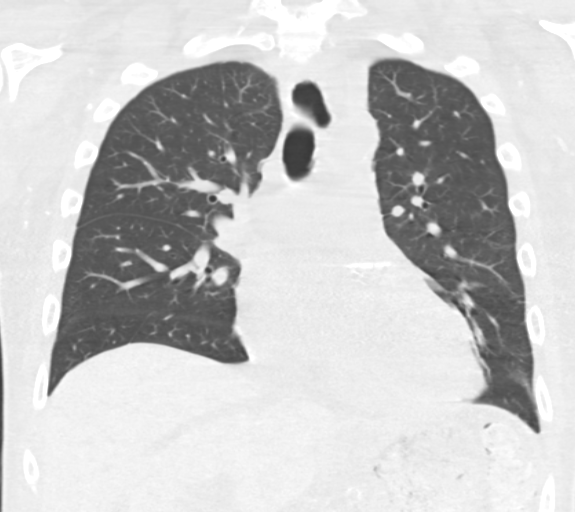
[im 44/74  lung]
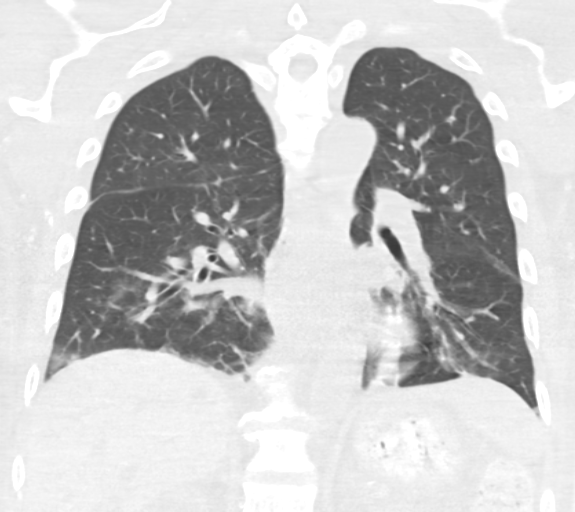

[15 of 36 positions shown; findings below may reference images not displayed]

FINDINGS: Mediastinum/Nodes: No axillary supraclavicular adenopathy. Normal
thyroid gland. No mediastinal hilar adenopathy. No pericardial
fluid. Coronary artery calcifications are present.

Knee RIGHT paratracheal density on comparison radiograph corresponds
to an ectatic superior vena cava.

Lungs/Pleura: There bilateral pleural effusions. There is mild
airspace disease in the RIGHT lower lobe (image 42, series 3)
airways are normal.

Upper abdomen: Limited view of the liver, kidneys, pancreas are
unremarkable. Normal adrenal glands. Mild airspace disease in the
LEFT lower lobe to a lesser degree.

Musculoskeletal: Diffuse sclerosis of the bones.  No acute findings.
IMPRESSION: 1. Ectatic superior vena cava corresponds to the plain film
abnormality, a benign finding.
2. Bilateral pleural effusions.
3. Mild airspace disease in the LEFT and RIGHT lower lobe consistent
with atelectasis, pneumonia, or aspiration.

## 2016-12-22 IMAGING — CR DG CHEST 1V PORT
1 series · 1 of 1 positions shown · non-contrast
Comparison: One-view chest x-ray 02/24/2015 and CT chest
02/25/2015.

CLINICAL DATA: Hypoxia.  Fever.

EXAM:
PORTABLE CHEST - 1 VIEW

[AP]
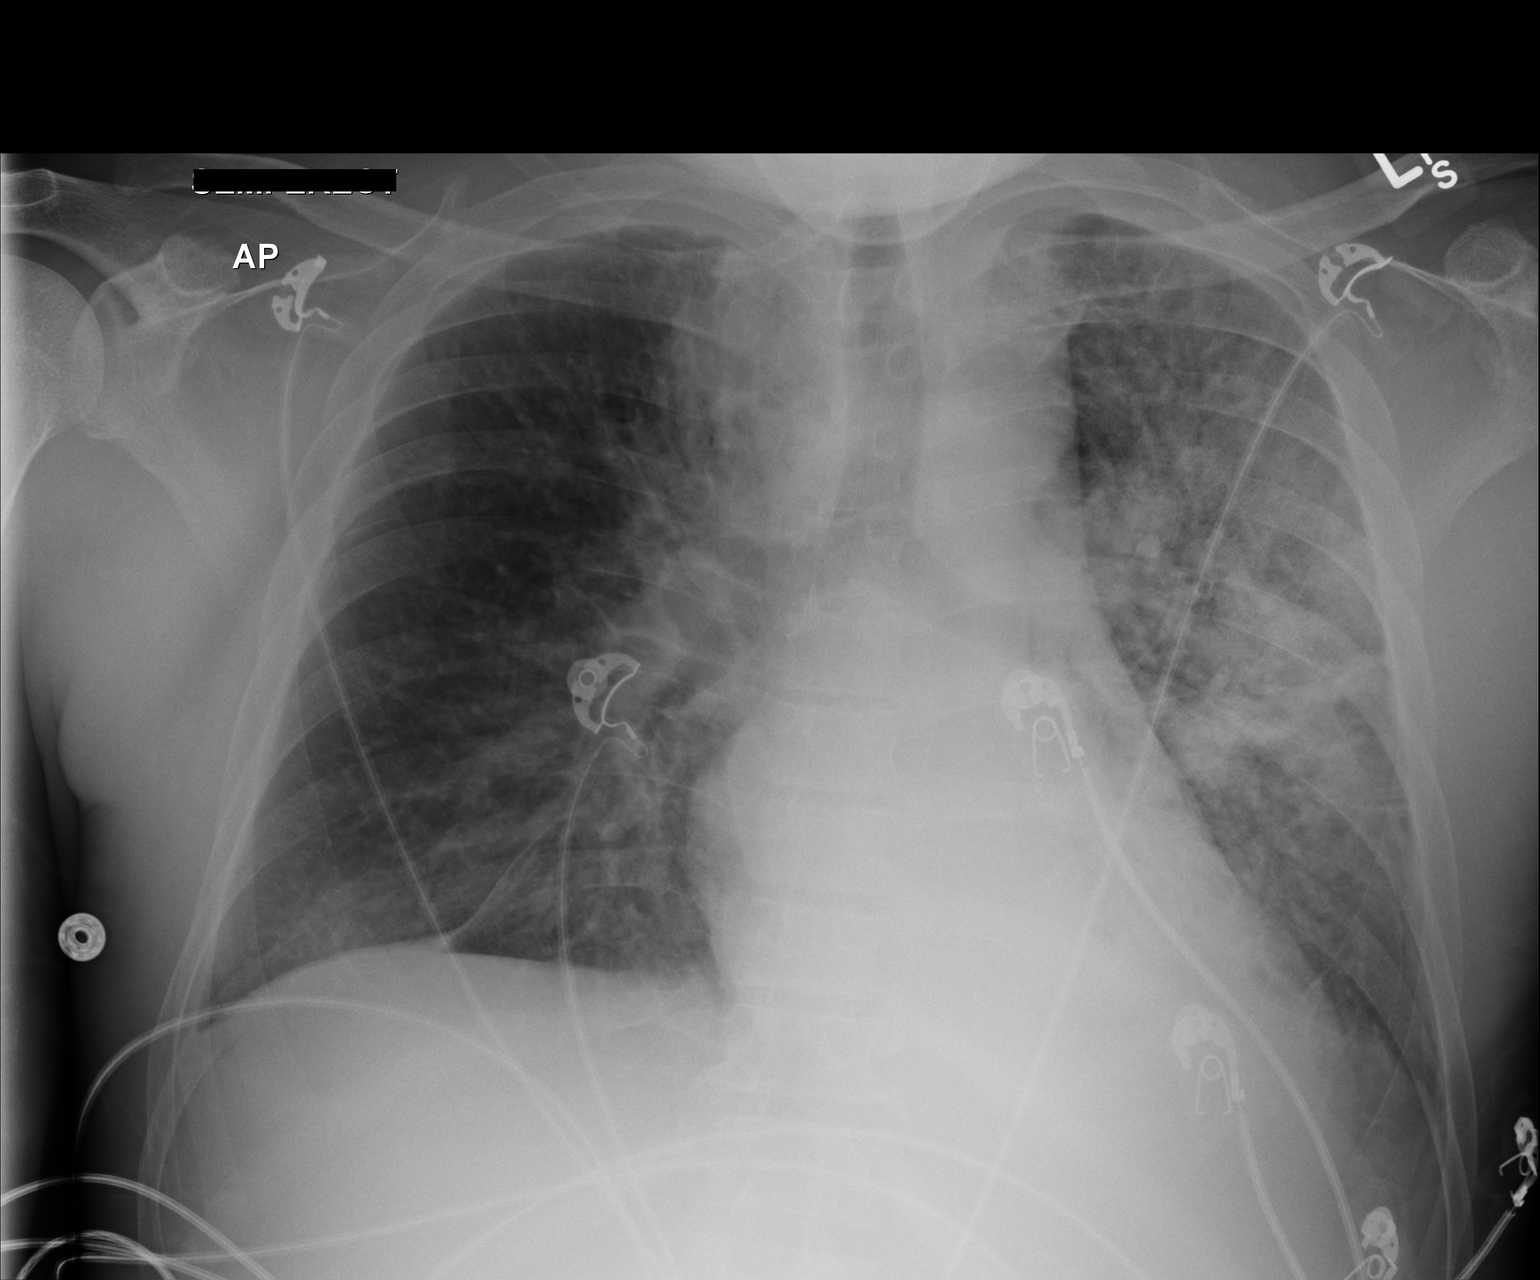

[1 of 1 positions shown; findings below may reference images not displayed]

FINDINGS: The heart is mildly enlarged. New left upper lobe pneumonia is
present. There is some airspace disease at the right base ***---***
the there is some airspace disease at the lung bases bilaterally.
Small effusions still persist.
IMPRESSION: 1. New left upper lobe pneumonia.
2. Small effusions and bibasilar airspace disease. While this may
reflect atelectasis, infection is not excluded.

## 2018-02-27 ENCOUNTER — Encounter (HOSPITAL_COMMUNITY): Payer: Self-pay

## 2018-02-27 ENCOUNTER — Ambulatory Visit (HOSPITAL_COMMUNITY)
Admission: EM | Admit: 2018-02-27 | Discharge: 2018-02-27 | Disposition: A | Discharge status: Home / Self Care | Payer: Medicaid Other | Attending: Family Medicine | Admitting: Family Medicine

## 2018-02-27 ENCOUNTER — Ambulatory Visit (HOSPITAL_COMMUNITY): Admit: 2018-02-27 | Payer: Medicaid Other | Source: Home / Self Care

## 2018-02-27 DIAGNOSIS — H60392 Other infective otitis externa, left ear: Secondary | ICD-10-CM | POA: Insufficient documentation

## 2018-02-27 MED ORDER — OFLOXACIN 0.3 % OT SOLN: 3.0000 [drp] | Freq: Two times a day (BID) | OTIC | 0 refills | Status: DC

## 2018-02-27 NOTE — ED Triage Notes (Signed)
Pt present left ear drainage, Symptoms started this morning.  He has some clear or white drainage coming out his left ear.

## 2018-02-28 NOTE — ED Provider Notes (Signed)
[place]   [id number] 02/27/18 Arrival Time: 0350  ASSESSMENT & PLAN:  1. Infective otitis externa of left ear    Meds ordered this encounter  Medications  . ofloxacin (FLOXIN) 0.3 % OTIC solution    Sig: Place 3 drops into the left ear 2 (two) times daily. For up to one week.    Dispense:  5 mL    Refill:  0   OTC symptom care as needed. Ensure adequate fluid intake and rest. May f/u with PCP or here as needed or if not seeing improvement over the next 48-72 hours.  Reviewed expectations re: course of current medical issues. Questions answered. Outlined signs and symptoms indicating need for more acute intervention. Patient verbalized understanding. After Visit Summary given.   SUBJECTIVE: History from: caregiver.  [name] is a 48 y.o. male who presents with complaint of left ear drainage; thick and yellow; without bleeding. Onset gradual, over the past 24-36 hours. Recent cold symptoms: minimal. Fever: no. Overall normal PO intake without n/v. Sick contacts: no. OTC treatment: none.  Social History   Tobacco Use  Smoking Status Never Smoker    ROS: As per HPI.   OBJECTIVE:  Vitals:   02/27/18 1841  BP: (!) 156/85  Pulse: (!) 116  Resp: 16  Temp: (!) 100.6 F (38.1 C)  TempSrc: Oral  SpO2: 99%    Recheck pulse: 98  General appearance: no distress; in wheelchair; keeps eyes closed while here; does not respond to my questions (caregiver reports this is baseline) Ear Canal: edema and inflammation with exudate of left EAC TM: bilateral: without erythema or bulging Neck: supple without LAD Lungs: unlabored respirations, symmetrical air entry; cough: absent; no respiratory distress Skin: warm and dry Psychological: alert and cooperative; normal mood and affect  Allergies  Allergen Reactions  . Lactose Intolerance (Gi) Diarrhea  . Polymyxin B Swelling    "Can't take this eye drop," per outside records.   . Sulfamethoxazole Rash   "Swelling of area, face, from drop that had sulfa," per outside records.     Past Medical History:  Diagnosis Date  . Acute respiratory failure with hypoxia ([place])   . Anemia   . Chest pain   . Constipation   . Depression   . Diabetes mellitus   . ESRD (end stage renal disease) on dialysis [facility])    "[place] [address]; MWF" (02/25/2015)  . HCAP (healthcare-associated pneumonia)   . Hypertension   . Hyperthyroidism   . Hyperthyroidism   . Quadriparesis (muscle weakness) ([place])   . Schizo-affective schizophrenia ([place])    Family History  Family history unknown: Yes   Social History   Socioeconomic History  . Marital status: Single    Spouse name: Not on file  . Number of children: Not on file  . Years of education: Not on file  . Highest education level: Not on file  Occupational History  . Not on file  Social Needs  . Financial resource strain: Not on file  . Food insecurity:    Worry: Not on file    Inability: Not on file  . Transportation needs:    Medical: Not on file    Non-medical: Not on file  Tobacco Use  . Smoking status: Never Smoker  Substance and Sexual Activity  . Alcohol use: No  . Drug use: No  . Sexual activity: Not on file  Lifestyle  . Physical activity:    Days per week: Not on  file    Minutes per session: Not on file  . Stress: Not on file  Relationships  . Social connections:    Talks on phone: Not on file    Gets together: Not on file    Attends religious service: Not on file    Active member of club or organization: Not on file    Attends meetings of clubs or organizations: Not on file    Relationship status: Not on file  . Intimate partner violence:    Fear of current or ex partner: Not on file    Emotionally abused: Not on file    Physically abused: Not on file    Forced sexual activity: Not on file  Other Topics Concern  . Not on file  Social History Narrative  . Not on file            Vanessa Kick,  MD 02/28/18 [phone]

## 2019-01-07 ENCOUNTER — Other Ambulatory Visit: Payer: Self-pay

## 2019-01-07 DIAGNOSIS — Z20822 Contact with and (suspected) exposure to covid-19: Secondary | ICD-10-CM

## 2019-01-09 LAB — NOVEL CORONAVIRUS, NAA: SARS-CoV-2, NAA: NOT DETECTED

## 2019-01-23 ENCOUNTER — Emergency Department (HOSPITAL_COMMUNITY): Payer: Medicaid Other

## 2019-01-23 ENCOUNTER — Other Ambulatory Visit: Payer: Self-pay

## 2019-01-23 ENCOUNTER — Emergency Department (HOSPITAL_COMMUNITY)
Admission: EM | Admit: 2019-01-23 | Discharge: 2019-01-23 | Disposition: A | Discharge status: Home / Self Care | Payer: Medicaid Other | Attending: Emergency Medicine | Admitting: Emergency Medicine

## 2019-01-23 ENCOUNTER — Encounter (HOSPITAL_COMMUNITY): Payer: Self-pay

## 2019-01-23 DIAGNOSIS — R1032 Left lower quadrant pain: Secondary | ICD-10-CM | POA: Diagnosis present

## 2019-01-23 DIAGNOSIS — N186 End stage renal disease: Secondary | ICD-10-CM | POA: Insufficient documentation

## 2019-01-23 DIAGNOSIS — I120 Hypertensive chronic kidney disease with stage 5 chronic kidney disease or end stage renal disease: Secondary | ICD-10-CM | POA: Insufficient documentation

## 2019-01-23 DIAGNOSIS — Z79899 Other long term (current) drug therapy: Secondary | ICD-10-CM | POA: Insufficient documentation

## 2019-01-23 DIAGNOSIS — U071 COVID-19: Secondary | ICD-10-CM | POA: Diagnosis not present

## 2019-01-23 DIAGNOSIS — E1122 Type 2 diabetes mellitus with diabetic chronic kidney disease: Secondary | ICD-10-CM | POA: Insufficient documentation

## 2019-01-23 DIAGNOSIS — Z794 Long term (current) use of insulin: Secondary | ICD-10-CM | POA: Insufficient documentation

## 2019-01-23 DIAGNOSIS — Z992 Dependence on renal dialysis: Secondary | ICD-10-CM | POA: Insufficient documentation

## 2019-01-23 LAB — CBC
HCT: 34.8 % — ABNORMAL LOW (ref 39.0–52.0)
Hemoglobin: 10.3 g/dL — ABNORMAL LOW (ref 13.0–17.0)
MCH: 29.9 pg (ref 26.0–34.0)
MCHC: 29.6 g/dL — ABNORMAL LOW (ref 30.0–36.0)
MCV: 100.9 fL — ABNORMAL HIGH (ref 80.0–100.0)
Platelets: 123 10*3/uL — ABNORMAL LOW (ref 150–400)
RBC: 3.45 MIL/uL — ABNORMAL LOW (ref 4.22–5.81)
RDW: 15.1 % (ref 11.5–15.5)
WBC: 3.8 10*3/uL — ABNORMAL LOW (ref 4.0–10.5)
nRBC: 0 % (ref 0.0–0.2)

## 2019-01-23 LAB — POC SARS CORONAVIRUS 2 AG -  ED: SARS Coronavirus 2 Ag: POSITIVE — AB

## 2019-01-23 LAB — LIPASE, BLOOD: Lipase: 81 U/L — ABNORMAL HIGH (ref 11–51)

## 2019-01-23 LAB — COMPREHENSIVE METABOLIC PANEL
ALT: 40 U/L (ref 0–44)
AST: 70 U/L — ABNORMAL HIGH (ref 15–41)
Albumin: 3.3 g/dL — ABNORMAL LOW (ref 3.5–5.0)
Alkaline Phosphatase: 174 U/L — ABNORMAL HIGH (ref 38–126)
Anion gap: 16 — ABNORMAL HIGH (ref 5–15)
BUN: 42 mg/dL — ABNORMAL HIGH (ref 6–20)
CO2: 24 mmol/L (ref 22–32)
Calcium: 8.6 mg/dL — ABNORMAL LOW (ref 8.9–10.3)
Chloride: 100 mmol/L (ref 98–111)
Creatinine, Ser: 6.48 mg/dL — ABNORMAL HIGH (ref 0.61–1.24)
GFR calc Af Amer: 11 mL/min — ABNORMAL LOW (ref >60)
GFR calc non Af Amer: 9 mL/min — ABNORMAL LOW (ref >60)
Glucose, Bld: 174 mg/dL — ABNORMAL HIGH (ref 70–99)
Potassium: 4.4 mmol/L (ref 3.5–5.1)
Sodium: 140 mmol/L (ref 135–145)
Total Bilirubin: 0.4 mg/dL (ref 0.3–1.2)
Total Protein: 7.3 g/dL (ref 6.5–8.1)

## 2019-01-23 MED ORDER — SODIUM CHLORIDE (PF) 0.9 % IJ SOLN
INTRAMUSCULAR | Status: AC
  Filled 2019-01-23: qty 50

## 2019-01-23 MED ORDER — IOHEXOL 300 MG/ML  SOLN
100.0000 mL | Freq: Once | INTRAMUSCULAR | Status: AC | PRN
  Administered 2019-01-23: 18:00:00 100 mL via INTRAVENOUS

## 2019-01-23 MED ORDER — ACETAMINOPHEN 325 MG PO TABS
650.0000 mg | ORAL_TABLET | Freq: Once | ORAL | Status: AC
  Administered 2019-01-23: 650 mg via ORAL
  Filled 2019-01-23: qty 2

## 2019-01-23 NOTE — Discharge Instructions (Signed)
Continue giving Tylenol as needed for fever as prescribed over-the-counter.  Make sure to get plenty of rest.  Please return the emergency department immediately if you develop severe shortness of breath or any other new or concerning symptoms.

## 2019-01-23 NOTE — ED Triage Notes (Addendum)
Caregiver reports weakness and abdominal pain x2 days. Pt is a dialysis pt. Pt is wheelchair bound and mainly non-verbal. Caregiver gave Tylenol around 10am.

## 2019-01-23 NOTE — ED Notes (Signed)
Pt caregiver, Manuela Schwartz, at bedside.

## 2019-01-23 NOTE — ED Notes (Signed)
Pt is on dialysis and does not urinate.

## 2019-01-23 NOTE — ED Provider Notes (Signed)
[facility] DEPT Provider Note   CSN: [id number] Arrival date & time: 01/23/19  1244     History Chief Complaint  Patient presents with  . Abdominal Pain  . Weakness    [name] is a 48 y.o. male with history of ESRD on dialysis, diabetes, hypertension, hypothyroidism, quadriparesis, schizoaffective schizophrenia who presents with abdominal pain, left shoulder pain, and fever that began this morning.  Patient denies any cough, chest pain, shortness of breath, nausea, vomiting, diarrhea.  He does not make urine.  Patient's caregiver reports he slid down a chair and caught himself with his arm.  Since then he has been complaining about left shoulder pain.  He did not technically fall.  HPI     Past Medical History:  Diagnosis Date  . Acute respiratory failure with hypoxia ([place])   . Anemia   . Chest pain   . Constipation   . Depression   . Diabetes mellitus   . ESRD (end stage renal disease) on dialysis [facility])    "[place] off [address]; MWF" (02/25/2015)  . HCAP (healthcare-associated pneumonia)   . Hypertension   . Hyperthyroidism   . Hyperthyroidism   . Quadriparesis (muscle weakness) ([place])   . Schizo-affective schizophrenia [facility])     Patient Active Problem List   Diagnosis Date Noted  . Acute respiratory failure with hypoxia ([place]) 03/08/2015  . HCAP (healthcare-associated pneumonia) 03/08/2015  . Chest pain 03/08/2015  . Quadriparesis (muscle weakness) ([place])   . ESRD (end stage renal disease) on dialysis ([place]) 02/24/2015  . Constipation, acute 02/24/2015  . Normocytic anemia 02/24/2015  . Occult GI bleeding 02/24/2015  . Hypertension 02/24/2015  . Thrombocytopenia ([place]) 02/24/2015  . Hypothermia 02/24/2015  . Elevated lactic acid level 02/24/2015  . Hyperthyroidism 02/24/2015  . Anemia due to GI blood loss 02/24/2015  . Schizophrenia ([place]) 02/24/2015  . Constipation   . Chronic osteomyelitis ([place]) 06/26/2013     Past Surgical History:  Procedure Laterality Date  . DIALYSIS FISTULA CREATION    . MULTIPLE EXTRACTIONS WITH ALVEOLOPLASTY  01/17/2011   Procedure: MULTIPLE EXTRACION WITH ALVEOLOPLASTY;  Surgeon: Gae Bon;  Location: [facility] OR;  Service: Oral Surgery;  Laterality: Bilateral;  Extraction of number one, sixteen, seventeen, and thirty two.        Family History  Family history unknown: Yes    Social History   Tobacco Use  . Smoking status: Never Smoker  Substance Use Topics  . Alcohol use: No  . Drug use: No    Home Medications Prior to Admission medications   Medication Sig Start Date End Date Taking? Authorizing Provider  bisacodyl (DULCOLAX) 10 MG suppository Place 1 suppository (10 mg total) rectally daily as needed for moderate constipation. 02/27/15  Yes Buriev, Arie Sabina, MD  cefTAZidime 2 g in dextrose 5 % 50 mL Inject 2 g into the vein once. LAST dose on HD 03/12/15 03/11/15  Yes Tat, Shanon Brow, MD  cinacalcet (SENSIPAR) 30 MG tablet Take 30 mg by mouth See admin instructions. Tuesday, Thursday & Saturday   Yes [provider]  cloNIDine (CATAPRES) 0.1 MG tablet Take 1 tablet (0.1 mg total) by mouth 3 (three) times daily. 02/27/15  Yes Buriev, Arie Sabina, MD  hydrALAZINE (APRESOLINE) 100 MG tablet Take 1 tablet (100 mg total) by mouth 3 (three) times daily. 02/27/15  Yes Buriev, Arie Sabina, MD  insulin lispro (HUMALOG) 100 UNIT/ML injection Inject 5 Units into the skin 3 (three) times daily before  meals. Inject additional units for blood sugar levels that are 301-350=6 units, 351-400=8 units.   Yes [provider]  LORazepam (ATIVAN) 1 MG tablet Take 1 mg by mouth 3 (three) times daily. For anxiety    Yes [provider]  multivitamin (RENA-VIT) TABS tablet Take 1 tablet by mouth daily.     Yes [provider]  nitroGLYCERIN (NITROSTAT) 0.4 MG SL tablet Place 0.4 mg under the tongue every 5 (five) minutes as needed. For chest pain    Yes  [provider]  pantoprazole (PROTONIX) 40 MG tablet Take 40 mg by mouth daily.     Yes [provider]  prednisoLONE acetate (PRED FORTE) 1 % ophthalmic suspension Place 1 drop into the left eye 2 (two) times daily as needed (inflammation).  10/20/14  Yes [provider]  risperiDONE (RISPERDAL) 2 MG tablet Take 2 mg by mouth 2 (two) times daily.     Yes [provider]  senna-docusate (SENOKOT-S) 8.6-50 MG tablet Take 1 tablet by mouth 2 (two) times daily. 02/27/15  Yes Buriev, Arie Sabina, MD  sevelamer (RENVELA) 800 MG tablet Take 800 mg by mouth 3 (three) times daily.     Yes [provider]  simvastatin (ZOCOR) 20 MG tablet Take 20 mg by mouth at bedtime.     Yes [provider]  temazepam (RESTORIL) 15 MG capsule Take 15 mg by mouth at bedtime.     Yes [provider]  Vancomycin (VANCOCIN) 750 MG/150ML SOLN Inject 150 mLs (750 mg total) into the vein every Monday, Wednesday, and Friday with hemodialysis. LAST dose on HD 03/12/15 03/11/15  Yes Tat, Shanon Brow, MD  amLODipine (NORVASC) 10 MG tablet Take 1 tablet (10 mg total) by mouth at bedtime. Patient not taking: Reported on 01/23/2019 02/27/15   Kinnie Feil, MD  insulin glargine (LANTUS) 100 UNIT/ML injection Inject 0.12 mLs (12 Units total) into the skin daily. Patient not taking: Reported on 01/23/2019 03/11/15   Orson Eva, MD  labetalol (NORMODYNE) 100 MG tablet Take 1 tablet (100 mg total) by mouth 2 (two) times daily. Patient not taking: Reported on 01/23/2019 02/27/15   Kinnie Feil, MD  ofloxacin (FLOXIN) 0.3 % OTIC solution Place 3 drops into the left ear 2 (two) times daily. For up to one week. Patient not taking: Reported on 01/23/2019 02/27/18   Vanessa Kick, MD    Allergies    Lactose intolerance (gi), Polymyxin b, and Sulfamethoxazole  Review of Systems   Review of Systems  Constitutional: Negative for chills and fever.  HENT: Negative for facial swelling and  sore throat.   Respiratory: Negative for cough and shortness of breath.   Cardiovascular: Negative for chest pain.  Gastrointestinal: Positive for abdominal pain. Negative for diarrhea, nausea and vomiting.  Genitourinary: Positive for decreased urine volume (does not make urine). Negative for discharge, penile pain, penile swelling, scrotal swelling and testicular pain.  Musculoskeletal: Positive for arthralgias. Negative for back pain and neck pain.  Skin: Negative for rash and wound.  Neurological: Negative for headaches.  Psychiatric/Behavioral: The patient is not nervous/anxious.     Physical Exam Updated Vital Signs BP (!) 144/73 (BP Location: Left Arm)   Pulse 90   Temp 98.8 F (37.1 C) (Oral)   Resp 17   SpO2 95%   Physical Exam Vitals and nursing note reviewed.  Constitutional:      General: He is not in acute distress.    Appearance: He is well-developed. He  is not diaphoretic.  HENT:     Head: Normocephalic and atraumatic.  Eyes:     General: No scleral icterus.       Right eye: No discharge.        Left eye: No discharge.     Conjunctiva/sclera: Conjunctivae normal.     Pupils: Pupils are equal, round, and reactive to light.  Neck:     Thyroid: No thyromegaly.  Cardiovascular:     Rate and Rhythm: Normal rate and regular rhythm.     Heart sounds: Normal heart sounds. No murmur. No friction rub. No gallop.   Pulmonary:     Effort: Pulmonary effort is normal. No respiratory distress.     Breath sounds: Normal breath sounds. No stridor. No wheezing or rales.  Abdominal:     General: Bowel sounds are normal. There is no distension.     Palpations: Abdomen is soft.     Tenderness: There is abdominal tenderness in the left lower quadrant. There is no guarding or rebound.    Genitourinary:    Comments: Some well-healing decubitus ulcers over the sacral region, they are not open, no erythema, no significant tenderness Musculoskeletal:     Left shoulder:  Tenderness and bony tenderness present.       Arms:     Cervical back: Normal range of motion and neck supple.  Lymphadenopathy:     Cervical: No cervical adenopathy.  Skin:    General: Skin is warm and dry.     Coloration: Skin is not pale.     Findings: No rash.  Neurological:     Mental Status: He is alert.     Coordination: Coordination normal.     ED Results / Procedures / Treatments   Labs (all labs ordered are listed, but only abnormal results are displayed) Labs Reviewed  LIPASE, BLOOD - Abnormal; Notable for the following components:      Result Value   Lipase 81 (*)    All other components within normal limits  COMPREHENSIVE METABOLIC PANEL - Abnormal; Notable for the following components:   Glucose, Bld 174 (*)    BUN 42 (*)    Creatinine, Ser 6.48 (*)    Calcium 8.6 (*)    Albumin 3.3 (*)    AST 70 (*)    Alkaline Phosphatase 174 (*)    GFR calc non Af Amer 9 (*)    GFR calc Af Amer 11 (*)    Anion gap 16 (*)    All other components within normal limits  CBC - Abnormal; Notable for the following components:   WBC 3.8 (*)    RBC 3.45 (*)    Hemoglobin 10.3 (*)    HCT 34.8 (*)    MCV 100.9 (*)    MCHC 29.6 (*)    Platelets 123 (*)    All other components within normal limits  POC SARS CORONAVIRUS 2 AG -  ED - Abnormal; Notable for the following components:   SARS Coronavirus 2 Ag POSITIVE (*)    All other components within normal limits  URINALYSIS, ROUTINE W REFLEX MICROSCOPIC    EKG None  Radiology DG Chest Portable 1 View  Result Date: 01/23/2019 CLINICAL DATA:  COVID-19 positive, fever, weakness and abdominal pain for 2 days, dialysis patient EXAM: PORTABLE CHEST 1 VIEW COMPARISON:  Radiograph 03/08/2015, CT 02/25/2015 FINDINGS: There are hazy interstitial and patchy airspace opacities within the lungs most notably in the lung bases. No pneumothorax. No visible effusion. Pronounced  tortuosity of the right brachiocephalic vessels is similar to  comparison exams. The aorta is calcified. The remaining cardiomediastinal contours are unremarkable. No acute osseous or soft tissue abnormality. Levocurvature of the spine is likely positional. IMPRESSION: Hazy interstitial and patchy airspace opacities within the lungs, most notably in the lung bases. Findings are suspicious for multifocal pneumonia versus a combination of interstitial and alveolar edema. Electronically Signed   By: Lovena Le M.D.   On: 01/23/2019 18:27   DG Shoulder Left  Result Date: 01/23/2019 CLINICAL DATA:  Left shoulder pain, slid from wheelchair EXAM: LEFT SHOULDER - 2+ VIEW COMPARISON:  None. FINDINGS: There is no evidence of fracture or dislocation. There is no evidence of arthropathy or other focal bone abnormality. Vascular calcium noted in the axillary soft tissues. Included portion of the left lung is clear. IMPRESSION: 1. No acute bony abnormality. 2. Vascular calcium in the axillary soft tissues. Electronically Signed   By: Lovena Le M.D.   On: 01/23/2019 16:48    CT abdomen pelvis with contrast CLINICAL DATA: Fever with left-sided abdominal pain  EXAM: CT ABDOMEN AND PELVIS WITH CONTRAST  TECHNIQUE: Multidetector CT imaging of the abdomen and pelvis was performed using the standard protocol following bolus administration of intravenous contrast.  CONTRAST: 151mL OMNIPAQUE IOHEXOL 300 MG/ML SOLN  COMPARISON: Radiograph 11/21/2016  FINDINGS: Lower chest: There is some patchy airspace opacities in the lung bases with some interspersed areas of bandlike opacity likely reflecting atelectasis and/or scarring. Airways thickening is noted in the bases as well. Cardiac size at the upper limits of normal with coronary artery calcifications. Trace pericardial fluid. Bilateral gynecomastia is seen.  Hepatobiliary: No focal liver abnormality is seen. Gallbladder is largely decompressed at the time of exam. No visible gallstones, gallbladder wall thickening,  or biliary dilatation beyond expected normal for senescent change.  Pancreas: Mild distal pancreatic atrophy. No pancreatic ductal dilatation or surrounding inflammatory changes.  Spleen: Normal in size without focal abnormality.  Adrenals/Urinary Tract: Normal adrenal glands. Bilateral renal atrophy with cystic changes compatible with chronic dialysis dependence. No worrisome renal lesions. No urolithiasis or hydronephrosis. Urinary bladder is largely decompressed at the time of exam and therefore poorly evaluated by CT imaging.  Stomach/Bowel: Distal esophagus, stomach and duodenal sweep are unremarkable. No small bowel wall thickening or dilatation. No evidence of obstruction. No colonic dilatation or wall thickening. Mild circumferential rectal wall thickening with perirectal stranding which is nonspecific given the additional edematous changes throughout the abdomen.  Vascular/Lymphatic: Slight increased attenuation of the central mesentery. No pathologically enlarged nodes seen in the abdomen or pelvis though evaluation is somewhat limited due to a paucity of intraperitoneal fat.  Reproductive: The prostate and seminal vesicles are unremarkable. Calcifications along the spermatic cord are an often seen finding and advanced diabetes. Small amount of fluid is seen within the right inguinal canal.  Other: Diffuse circumferential body wall edema is noted. Some focal soft tissue thickening in skin defect is seen to the right of posterior midline adjacent the sacrum and could reflect a developing sacral decubitus ulcer towards the superior aspect of the gluteal cleft. Additional site of skin thickening noted at the left ischial tuberosity. No free intraperitoneal air. Diffusely increased attenuation of the mesentery with trace free fluid throughout the abdomen. Partially calcified soft tissue attenuation in the right upper extremity  Musculoskeletal: The osseous structures  appear diffusely demineralized which may limit detection of small or nondisplaced fractures. No acute osseous abnormality or suspicious osseous lesions. Sclerotic changes of  the spine including increased attenuation of the endplates are compatible with CT features of renal osteodystrophy. Multilevel degenerative changes are present in the imaged portions of the spine. Additional degenerative changes in the pelvis and hips. There is marked venous distension in the right upper extremity with a focal low attenuation nodule in the distal arm near the site of suspected dialysis fistula. Suspect a functional fistula in a more proximal brachiocephalic configuration.  IMPRESSION: 1. Patchy airspace opacities in the lung bases worrisome for features of pneumonia. 2. Mild circumferential rectal wall thickening with perirectal stranding which is nonspecific given the additional edematous changes throughout the abdomen and pelvis. Findings may represent a nonspecific proctitis. 3. Focal skin thickening and subcutaneous stranding superficial to the the left ischial tuberosity and to the right of the midline sacrum. Correlate with visual inspection to exclude decubitus ulceration. 4. Features of anasarca including diffuse body wall edema, increased attenuation of the mesentery, and small volume fluid in the abdomen and pelvis as well as trace pericardial fluid. 64. Likely old thrombosed distal fistula access in the forearm. Venous distention in the upper arm may indicate a working brachiocephalic fistula. Correlate with history. 6. Osseous features compatible with renal osteodystrophy. 7. Cystic in atrophic changes of the kidneys compatible with chronic dialysis. 8. Additional incidental and ancillary findings as detailed above.  These results were called by telephone at the time of interpretation on 01/23/2019 at 7:10 pm to provider Reisa Coppola , who verbally acknowledged these  results.   Electronically Signed By: Lovena Le M.D. On: 01/23/2019 19:01  Procedures Procedures (including critical care time)  Medications Ordered in ED Medications  sodium chloride (PF) 0.9 % injection (has no administration in time range)  acetaminophen (TYLENOL) tablet 650 mg (650 mg Oral Given 01/23/19 1605)  iohexol (OMNIPAQUE) 300 MG/ML solution 100 mL (100 mLs Intravenous Contrast Given 01/23/19 1826)    ED Course  I have reviewed the triage vital signs and the nursing notes.  Pertinent labs & imaging results that were available during my care of the patient were reviewed by me and considered in my medical decision making (see chart for details).    MDM Rules/Calculators/A&P                      Patient presenting with abdominal pain, generalized weakness, left shoulder pain as well as fever.  Chest x-ray shows findings consistent with COVID-19 pneumonia versus interstitial edema.  Found to be positive for COVID-19 infection. CT abdomen pelvis as above.  Radiology was discussed with me and stated that the swelling around rectum could be related to patient's general anasarca.  Patient denies any pain other than in his left mid abdomen and left shoulder.  Less likely proctitis at this time.  He has had cough, but denies any shortness of breath.  His oxygen saturations are remaining 95%.  He had to stop dialysis prematurely yesterday due to his pressure dropping and thus they did not get as much fluid as normal saline.  Suspect this is related to anasarca seen.  Patient's caregiver feels his abdomen looks like normal for him.  He does have some well-appearing sacral decubitus ulcers that do not appear infected, and there is is seen in CT abdomen pelvis as well.  Will discharge home with supportive treatment including rest, Tylenol for fever.  Given the low threshold to return.  Patient also guided by my attending, Dr. Wilson Singer, who guided the patient's management and agrees with  plan.  Final Clinical Impression(s) / ED Diagnoses Final diagnoses:  [id number] virus infection  Left lower quadrant abdominal pain    Rx / DC Orders ED Discharge Orders    None       Frederica Kuster, PA-C 01/23/19 2232    Virgel Manifold, MD 01/24/19 Johnnye Lana

## 2019-01-28 ENCOUNTER — Telehealth: Payer: Self-pay | Admitting: Infectious Diseases

## 2019-01-28 NOTE — Telephone Encounter (Signed)
Called to discuss with patient about Covid symptoms and the use of bamlanivimab, a monoclonal antibody infusion for those with mild to moderate Covid symptoms and at a high risk of hospitalization.  Pt is qualified for this infusion at the [facility] infusion center due to Diabetes and Chronic kidney disease.    He is not interested at this time.

## 2019-01-30 ENCOUNTER — Emergency Department (HOSPITAL_COMMUNITY): Payer: Medicaid Other

## 2019-01-30 ENCOUNTER — Emergency Department (HOSPITAL_COMMUNITY)
Admission: EM | Admit: 2019-01-30 | Discharge: 2019-01-30 | Disposition: A | Discharge status: Home / Self Care | Payer: Medicaid Other | Attending: Emergency Medicine | Admitting: Emergency Medicine

## 2019-01-30 DIAGNOSIS — R400 Somnolence: Secondary | ICD-10-CM | POA: Diagnosis not present

## 2019-01-30 DIAGNOSIS — I120 Hypertensive chronic kidney disease with stage 5 chronic kidney disease or end stage renal disease: Secondary | ICD-10-CM | POA: Insufficient documentation

## 2019-01-30 DIAGNOSIS — Z794 Long term (current) use of insulin: Secondary | ICD-10-CM | POA: Insufficient documentation

## 2019-01-30 DIAGNOSIS — N186 End stage renal disease: Secondary | ICD-10-CM | POA: Insufficient documentation

## 2019-01-30 DIAGNOSIS — Z992 Dependence on renal dialysis: Secondary | ICD-10-CM | POA: Insufficient documentation

## 2019-01-30 DIAGNOSIS — R4189 Other symptoms and signs involving cognitive functions and awareness: Secondary | ICD-10-CM | POA: Diagnosis present

## 2019-01-30 DIAGNOSIS — E1122 Type 2 diabetes mellitus with diabetic chronic kidney disease: Secondary | ICD-10-CM | POA: Diagnosis not present

## 2019-01-30 DIAGNOSIS — Z79899 Other long term (current) drug therapy: Secondary | ICD-10-CM | POA: Insufficient documentation

## 2019-01-30 DIAGNOSIS — R0602 Shortness of breath: Secondary | ICD-10-CM

## 2019-01-30 LAB — CBC WITH DIFFERENTIAL/PLATELET
Abs Immature Granulocytes: 0.03 10*3/uL (ref 0.00–0.07)
Basophils Absolute: 0 10*3/uL (ref 0.0–0.1)
Basophils Relative: 0 %
Eosinophils Absolute: 0 10*3/uL (ref 0.0–0.5)
Eosinophils Relative: 1 %
HCT: 36 % — ABNORMAL LOW (ref 39.0–52.0)
Hemoglobin: 11.6 g/dL — ABNORMAL LOW (ref 13.0–17.0)
Immature Granulocytes: 1 %
Lymphocytes Relative: 16 %
Lymphs Abs: 0.6 10*3/uL — ABNORMAL LOW (ref 0.7–4.0)
MCH: 30.6 pg (ref 26.0–34.0)
MCHC: 32.2 g/dL (ref 30.0–36.0)
MCV: 95 fL (ref 80.0–100.0)
Monocytes Absolute: 0.6 10*3/uL (ref 0.1–1.0)
Monocytes Relative: 17 %
Neutro Abs: 2.4 10*3/uL (ref 1.7–7.7)
Neutrophils Relative %: 65 %
Platelets: UNDETERMINED 10*3/uL (ref 150–400)
RBC: 3.79 MIL/uL — ABNORMAL LOW (ref 4.22–5.81)
RDW: 14.6 % (ref 11.5–15.5)
WBC: 3.6 10*3/uL — ABNORMAL LOW (ref 4.0–10.5)
nRBC: 0 % (ref 0.0–0.2)

## 2019-01-30 LAB — POCT I-STAT EG7
Acid-Base Excess: 6 mmol/L — ABNORMAL HIGH (ref 0.0–2.0)
Bicarbonate: 30.5 mmol/L — ABNORMAL HIGH (ref 20.0–28.0)
Calcium, Ion: 1.05 mmol/L — ABNORMAL LOW (ref 1.15–1.40)
HCT: 37 % — ABNORMAL LOW (ref 39.0–52.0)
Hemoglobin: 12.6 g/dL — ABNORMAL LOW (ref 13.0–17.0)
O2 Saturation: 99 %
Potassium: 4.2 mmol/L (ref 3.5–5.1)
Sodium: 135 mmol/L (ref 135–145)
TCO2: 32 mmol/L (ref 22–32)
pCO2, Ven: 41.5 mmHg — ABNORMAL LOW (ref 44.0–60.0)
pH, Ven: 7.475 — ABNORMAL HIGH (ref 7.250–7.430)
pO2, Ven: 153 mmHg — ABNORMAL HIGH (ref 32.0–45.0)

## 2019-01-30 LAB — COMPREHENSIVE METABOLIC PANEL
ALT: 55 U/L — ABNORMAL HIGH (ref 0–44)
AST: 95 U/L — ABNORMAL HIGH (ref 15–41)
Albumin: 3.2 g/dL — ABNORMAL LOW (ref 3.5–5.0)
Alkaline Phosphatase: 194 U/L — ABNORMAL HIGH (ref 38–126)
Anion gap: 16 — ABNORMAL HIGH (ref 5–15)
BUN: 27 mg/dL — ABNORMAL HIGH (ref 6–20)
CO2: 23 mmol/L (ref 22–32)
Calcium: 9 mg/dL (ref 8.9–10.3)
Chloride: 96 mmol/L — ABNORMAL LOW (ref 98–111)
Creatinine, Ser: 4.74 mg/dL — ABNORMAL HIGH (ref 0.61–1.24)
GFR calc Af Amer: 16 mL/min — ABNORMAL LOW (ref >60)
GFR calc non Af Amer: 14 mL/min — ABNORMAL LOW (ref >60)
Glucose, Bld: 322 mg/dL — ABNORMAL HIGH (ref 70–99)
Potassium: 4.3 mmol/L (ref 3.5–5.1)
Sodium: 135 mmol/L (ref 135–145)
Total Bilirubin: 0.8 mg/dL (ref 0.3–1.2)
Total Protein: 7.6 g/dL (ref 6.5–8.1)

## 2019-01-30 LAB — AMMONIA: Ammonia: 36 umol/L — ABNORMAL HIGH (ref 9–35)

## 2019-01-30 LAB — ETHANOL: Alcohol, Ethyl (B): <10 mg/dL (ref <10)

## 2019-01-30 LAB — CBG MONITORING, ED: Glucose-Capillary: 324 mg/dL — ABNORMAL HIGH (ref 70–99)

## 2019-01-30 MED ORDER — ACETAMINOPHEN 500 MG PO TABS: 1000.0000 mg | ORAL_TABLET | Freq: Once | ORAL | Status: DC

## 2019-01-30 MED ORDER — NALOXONE HCL 0.4 MG/ML IJ SOLN
0.4000 mg | Freq: Once | INTRAMUSCULAR | Status: AC
  Administered 2019-01-30: 0.4 mg via INTRAVENOUS
  Filled 2019-01-30: qty 1

## 2019-01-30 NOTE — ED Notes (Signed)
Contacted pt's caregiver, Manuela Schwartz, let her know that pt is being discharged; Manuela Schwartz to pick pt up in 10-15 minutes

## 2019-01-30 NOTE — ED Triage Notes (Signed)
Pt arrived via [facility] from group home  Confirmed covid+  Pt was found unresponsive by caretaker on arrival to any painful stimuli.  Pt denies drug use or medicine ingestion.  A&ox3.

## 2019-01-30 NOTE — ED Provider Notes (Signed)
[facility] EMERGENCY DEPARTMENT Provider Note  CSN: [id number] Arrival date & time: 01/30/19 0111  Chief Complaint(s) No chief complaint on file. ED Triage Notes Peickert, Molli Knock, RN (Registered Nurse) . [facility] . . 01/30/2019 1:11 AM . . Signed   Pt arrived via [facility] from group home  Confirmed covid+  Pt was found unresponsive by caretaker on arrival to any painful stimuli.  Pt denies drug use or medicine ingestion.  A&ox3.        HPI [name] is a 48 y.o. male who presents to the emergency department after being found unresponsive by caregiver at a group home.  EMS reported that patient had initial GCS of 6.  He gradually improved in route.  Upon arrival here patient awoke with verbal stimuli.  Was oriented x3.  Denies any other physical complaints other than being hungry.  Patient is ESRD on dialysis Monday Wednesday Friday.  EMS reports that the group home stated that he went to dialysis as scheduled.  HPI  Past Medical History Past Medical History:  Diagnosis Date  . Acute respiratory failure with hypoxia ([place])   . Anemia   . Chest pain   . Constipation   . Depression   . Diabetes mellitus   . ESRD (end stage renal disease) on dialysis [facility])    "[place] off [address]; MWF" (02/25/2015)  . HCAP (healthcare-associated pneumonia)   . Hypertension   . Hyperthyroidism   . Hyperthyroidism   . Quadriparesis (muscle weakness) ([place])   . Schizo-affective schizophrenia [facility])    Patient Active Problem List   Diagnosis Date Noted  . Acute respiratory failure with hypoxia ([place]) 03/08/2015  . HCAP (healthcare-associated pneumonia) 03/08/2015  . Chest pain 03/08/2015  . Quadriparesis (muscle weakness) ([place])   . ESRD (end stage renal disease) on dialysis ([place]) 02/24/2015  . Constipation, acute 02/24/2015  . Normocytic anemia 02/24/2015  . Occult GI bleeding 02/24/2015  . Hypertension 02/24/2015  . Thrombocytopenia ([place])  02/24/2015  . Hypothermia 02/24/2015  . Elevated lactic acid level 02/24/2015  . Hyperthyroidism 02/24/2015  . Anemia due to GI blood loss 02/24/2015  . Schizophrenia ([place]) 02/24/2015  . Constipation   . Chronic osteomyelitis ([place]) 06/26/2013   Home Medication(s) Prior to Admission medications   Medication Sig Start Date End Date Taking? Authorizing Provider  amLODipine (NORVASC) 10 MG tablet Take 1 tablet (10 mg total) by mouth at bedtime. Patient not taking: Reported on 01/23/2019 02/27/15   Kinnie Feil, MD  bisacodyl (DULCOLAX) 10 MG suppository Place 1 suppository (10 mg total) rectally daily as needed for moderate constipation. 02/27/15   Kinnie Feil, MD  cefTAZidime 2 g in dextrose 5 % 50 mL Inject 2 g into the vein once. LAST dose on HD 03/12/15 03/11/15   Tat, Shanon Brow, MD  cinacalcet (SENSIPAR) 30 MG tablet Take 30 mg by mouth See admin instructions. Tuesday, Thursday & Saturday    [provider]  cloNIDine (CATAPRES) 0.1 MG tablet Take 1 tablet (0.1 mg total) by mouth 3 (three) times daily. 02/27/15   Kinnie Feil, MD  hydrALAZINE (APRESOLINE) 100 MG tablet Take 1 tablet (100 mg total) by mouth 3 (three) times daily. 02/27/15   Kinnie Feil, MD  insulin glargine (LANTUS) 100 UNIT/ML injection Inject 0.12 mLs (12 Units total) into the skin daily. Patient not taking: Reported on 01/23/2019 03/11/15   TatShanon Brow, MD  insulin lispro (HUMALOG) 100 UNIT/ML injection Inject 5 Units into  the skin 3 (three) times daily before meals. Inject additional units for blood sugar levels that are 301-350=6 units, 351-400=8 units.    [provider]  labetalol (NORMODYNE) 100 MG tablet Take 1 tablet (100 mg total) by mouth 2 (two) times daily. Patient not taking: Reported on 01/23/2019 02/27/15   Kinnie Feil, MD  LORazepam (ATIVAN) 1 MG tablet Take 1 mg by mouth 3 (three) times daily. For anxiety     [provider]  multivitamin (RENA-VIT) TABS tablet Take  1 tablet by mouth daily.      [provider]  nitroGLYCERIN (NITROSTAT) 0.4 MG SL tablet Place 0.4 mg under the tongue every 5 (five) minutes as needed. For chest pain     [provider]  ofloxacin (FLOXIN) 0.3 % OTIC solution Place 3 drops into the left ear 2 (two) times daily. For up to one week. Patient not taking: Reported on 01/23/2019 02/27/18   Vanessa Kick, MD  pantoprazole (PROTONIX) 40 MG tablet Take 40 mg by mouth daily.      [provider]  prednisoLONE acetate (PRED FORTE) 1 % ophthalmic suspension Place 1 drop into the left eye 2 (two) times daily as needed (inflammation).  10/20/14   [provider]  risperiDONE (RISPERDAL) 2 MG tablet Take 2 mg by mouth 2 (two) times daily.      [provider]  senna-docusate (SENOKOT-S) 8.6-50 MG tablet Take 1 tablet by mouth 2 (two) times daily. 02/27/15   Kinnie Feil, MD  sevelamer (RENVELA) 800 MG tablet Take 800 mg by mouth 3 (three) times daily.      [provider]  simvastatin (ZOCOR) 20 MG tablet Take 20 mg by mouth at bedtime.      [provider]  temazepam (RESTORIL) 15 MG capsule Take 15 mg by mouth at bedtime.      [provider]  Vancomycin (VANCOCIN) 750 MG/150ML SOLN Inject 150 mLs (750 mg total) into the vein every Monday, Wednesday, and Friday with hemodialysis. LAST dose on HD 03/12/15 03/11/15   Orson Eva, MD                                                                                                                                    Past Surgical History Past Surgical History:  Procedure Laterality Date  . DIALYSIS FISTULA CREATION    . MULTIPLE EXTRACTIONS WITH ALVEOLOPLASTY  01/17/2011   Procedure: MULTIPLE EXTRACION WITH ALVEOLOPLASTY;  Surgeon: Gae Bon;  Location: [facility] OR;  Service: Oral Surgery;  Laterality: Bilateral;  Extraction of number one, sixteen, seventeen, and thirty two.    Family History Family History  Family history  unknown: Yes    Social History Social History   Tobacco Use  . Smoking status: Never Smoker  Substance Use Topics  . Alcohol use: No  . Drug use: No   Allergies Lactose intolerance (gi), Polymyxin b,  and Sulfamethoxazole  Review of Systems Review of Systems  Skin: Positive for wound.   All other systems are reviewed and are negative for acute change except as noted in the HPI  Physical Exam Vital Signs  I have reviewed the triage vital signs BP (!) 164/91   Pulse 91   Temp (!) 96 F (35.6 C) (Rectal)   Resp 16   SpO2 100%   Physical Exam Vitals reviewed.  Constitutional:      General: He is not in acute distress.    Appearance: He is well-developed. He is not diaphoretic.  HENT:     Head: Normocephalic and atraumatic.     Nose: Nose normal.  Eyes:     General: No scleral icterus.       Right eye: No discharge.        Left eye: No discharge.     Conjunctiva/sclera: Conjunctivae normal.     Pupils: Pupils are equal, round, and reactive to light.  Cardiovascular:     Rate and Rhythm: Normal rate and regular rhythm.     Heart sounds: No murmur. No friction rub. No gallop.   Pulmonary:     Effort: Pulmonary effort is normal. No respiratory distress.     Breath sounds: Normal breath sounds. No stridor. No rales.  Abdominal:     General: There is no distension.     Palpations: Abdomen is soft.     Tenderness: There is no abdominal tenderness.  Musculoskeletal:        General: No tenderness.     Cervical back: Normal range of motion and neck supple.  Skin:    General: Skin is warm and dry.     Findings: No erythema or rash.  Neurological:     Mental Status: He is oriented to person, place, and time. He is lethargic.     Comments: Quadriplegic. Patient sleepy but easily arousable.  Oriented x3.  Follows commands.     ED Results and Treatments Labs (all labs ordered are listed, but only abnormal results are displayed) Labs Reviewed  COMPREHENSIVE  METABOLIC PANEL - Abnormal; Notable for the following components:      Result Value   Chloride 96 (*)    Glucose, Bld 322 (*)    BUN 27 (*)    Creatinine, Ser 4.74 (*)    Albumin 3.2 (*)    AST 95 (*)    ALT 55 (*)    Alkaline Phosphatase 194 (*)    GFR calc non Af Amer 14 (*)    GFR calc Af Amer 16 (*)    Anion gap 16 (*)    All other components within normal limits  CBC WITH DIFFERENTIAL/PLATELET - Abnormal; Notable for the following components:   WBC 3.6 (*)    RBC 3.79 (*)    Hemoglobin 11.6 (*)    HCT 36.0 (*)    Lymphs Abs 0.6 (*)    All other components within normal limits  AMMONIA - Abnormal; Notable for the following components:   Ammonia 36 (*)    All other components within normal limits  CBG MONITORING, ED - Abnormal; Notable for the following components:   Glucose-Capillary 324 (*)    All other components within normal limits  POCT I-STAT EG7 - Abnormal; Notable for the following components:   pH, Ven 7.475 (*)    pCO2, Ven 41.5 (*)    pO2, Ven 153.0 (*)    Bicarbonate 30.5 (*)    Acid-Base Excess  6.0 (*)    Calcium, Ion 1.05 (*)    HCT 37.0 (*)    Hemoglobin 12.6 (*)    All other components within normal limits  ETHANOL  URINALYSIS, COMPLETE (UACMP) WITH MICROSCOPIC  RAPID URINE DRUG SCREEN, HOSP PERFORMED  I-STAT VENOUS BLOOD GAS, ED                                                                                                                         EKG  EKG Interpretation  Date/Time:  Thursday January 30 2019 01:28:47 EST Ventricular Rate:  85 PR Interval:    QRS Duration: 83 QT Interval:  391 QTC Calculation: 465 R Axis:   51 Text Interpretation: Sinus rhythm Anterior infarct, old Borderline ST elevation, inferior leads No significant change since last tracing Confirmed by Addison Lank [id number]) on 01/30/2019 3:01:18 AM      Radiology No results found.  Pertinent labs & imaging results that were available during my care of the patient  were reviewed by me and considered in my medical decision making (see chart for details).  Medications Ordered in ED Medications  naloxone (NARCAN) injection 0.4 mg (0.4 mg Intravenous Given 01/30/19 0145)                                                                                                                                    Procedures Procedures  (including critical care time)  Medical Decision Making / ED Course I have reviewed the nursing notes for this encounter and the patient's prior records (if available in EHR or on provided paperwork).   [name] was evaluated in Emergency Department on 01/30/2019 for the symptoms described in the history of present illness. He was evaluated in the context of the global COVID-19 pandemic, which necessitated consideration that the patient might be at risk for infection with the SARS-CoV-2 virus that causes COVID-19. Institutional protocols and algorithms that pertain to the evaluation of patients at risk for COVID-19 are in a state of rapid change based on information released by regulatory bodies including the CDC and federal and state organizations. These policies and algorithms were followed during the patient's care in the ED.  Patient presented for altered mental status/unresponsiveness at group home.  Appears to be improving.  No history of seizures.  Patient is on sedating medication.   Patient provided with small dose of Narcan to which she  quickly responded.  Patient was more alert following Narcan.  Patient does not have any opiate medication listed on med list.  He does have clonidine.   Labs close to patient's baseline and grossly reassuring.  He does have lab findings consistent with Covid infection.  He does not have any increased oxygen requirement.  He was monitored for several hours without evidence of seizure.  Patient able to eat and tolerate oral intake.  Patient is apparently a ward of the state and a caregiver  named Carlos Levering presented to the emergency department.  Apparently she was Covid positive and was unfortunately not able to be let back.  I received a call from her supervisor, Jeani Hawking (who would not provide her last name), demanding that we allow Manuela Schwartz in to stay with the patient as the patient was 'not even able to remember his date of birth.'  We spoke to the before meals and Manuela Schwartz however she did not have any documentation reporting that she was the legal guardian.  Patient was fully oriented and able to make decisions.      Final Clinical Impression(s) / ED Diagnoses Final diagnoses:  Somnolence     The patient appears reasonably screened and/or stabilized for discharge and I doubt any other medical condition or other [facility] requiring further screening, evaluation, or treatment in the ED at this time prior to discharge.  Disposition: Discharge  Condition: Good  I have discussed the results, Dx and Tx plan with the patient who expressed understanding and agree(s) with the plan. Discharge instructions discussed at great length. The patient was given strict return precautions who verbalized understanding of the instructions. No further questions at time of discharge.    ED Discharge Orders    None        Follow Up: Iona Beard, [address] [place] [address] [phone]  Schedule an appointment as soon as possible for a visit  As needed     This chart was dictated using voice recognition software.  Despite best efforts to proofread,  errors can occur which can change the documentation meaning.   Fatima Blank, MD 01/30/19 [phone]

## 2019-01-30 NOTE — ED Notes (Signed)
Patient/caregiver verbalizes understanding of discharge instructions. Opportunity for questioning and answers were provided.

## 2019-01-30 NOTE — ED Notes (Signed)
Caregiver (susan ray) calling- [phone]. Caregiver is covid + but thinks she needs to be back with the pt

## 2019-02-12 ENCOUNTER — Ambulatory Visit: Payer: Medicaid Other | Attending: Internal Medicine

## 2019-02-12 DIAGNOSIS — Z20822 Contact with and (suspected) exposure to covid-19: Secondary | ICD-10-CM

## 2019-02-14 LAB — NOVEL CORONAVIRUS, NAA: SARS-CoV-2, NAA: NOT DETECTED

## 2020-01-08 ENCOUNTER — Ambulatory Visit: Payer: Medicaid Other | Attending: Family Medicine

## 2020-01-08 ENCOUNTER — Other Ambulatory Visit: Payer: Self-pay

## 2020-01-08 VITALS — BP 155/90

## 2020-01-08 DIAGNOSIS — R293 Abnormal posture: Secondary | ICD-10-CM | POA: Diagnosis present

## 2020-01-08 DIAGNOSIS — M6281 Muscle weakness (generalized): Secondary | ICD-10-CM | POA: Insufficient documentation

## 2020-01-09 NOTE — Therapy (Signed)
[facility] [address] [place], [place], [address] Phone: [phone]   Fax:  [phone]  Physical Therapy Evaluation  Patient Details  Name: [name] MRN: [id number] Date of Birth: 03/15/1970 Referring Provider (PT): Lucianne Lei, MD   Encounter Date: 01/08/2020   PT End of Session - 01/09/20 1134    Visit Number 1    Number of Visits 14    Date for PT Re-Evaluation 03/19/20    Authorization Type Medicaid    PT Start Time 6712    PT Stop Time 4580    PT Time Calculation (min) 52 min    Equipment Utilized During Treatment Gait belt    Activity Tolerance Patient tolerated treatment well    Behavior During Therapy Agitated;Flat affect           Past Medical History:  Diagnosis Date  . Acute respiratory failure with hypoxia ([place])   . Anemia   . Chest pain   . Constipation   . Depression   . Diabetes mellitus   . ESRD (end stage renal disease) on dialysis [facility])    "[place] off 68; MWF" (02/25/2015)  . HCAP (healthcare-associated pneumonia)   . Hypertension   . Hyperthyroidism   . Hyperthyroidism   . Quadriparesis (muscle weakness) ([place])   . Schizo-affective schizophrenia [facility])     Past Surgical History:  Procedure Laterality Date  . DIALYSIS FISTULA CREATION    . MULTIPLE EXTRACTIONS WITH ALVEOLOPLASTY  01/17/2011   Procedure: MULTIPLE EXTRACION WITH ALVEOLOPLASTY;  Surgeon: Gae Bon;  Location: [facility] OR;  Service: Oral Surgery;  Laterality: Bilateral;  Extraction of number one, sixteen, seventeen, and thirty two.     Vitals:   01/09/20 1125  BP: (!) 155/90      Subjective Assessment - 01/08/20 1405    Subjective Pt presents in W/C with full-time caregiver. Pt and caregiver report that patient requires assistance for all ADLs and functional mobility at this point besides bed mobility. Pt has been W/C bound for past 15 years secondary to diabetic coma and subsequent  deconditioning. Pt has cognitive deficits and became a little flustered during history. Caregiver helped to calm pt down and clarify missing information. Pt presents with ESRD, Type-II DM, HTN, legal blindness, schizophrenia, secondary hyperparathyroidism, and quadriparesis. Pt goals are to try to walk again.    Pertinent History PMH: ESRD, Type-II DM, HTN, legal blindness, schizophrenia, secondary hyperparathyroidism, and quadriparesis,    Limitations Sitting;Lifting;Standing;Walking;Writing;House hold activities    Patient Stated Goals Pt would like to be able to walk again. Caregiver would like him to be able to help more with activities.    Currently in Pain? No/denies              [facility] PT Assessment - 01/08/20 1410      Assessment   Medical Diagnosis ESRD, Quadriparesis    Referring Provider (PT) Lucianne Lei, MD    Onset Date/Surgical Date 12/09/19   Referral date   Hand Dominance Right   uses left more     Balance Screen   Has the patient fallen in the past 6 months No      [place] residence    Living Arrangements Other (Comment)   has live in caregiver   Available Help at Discharge Personal care attendant    Type of [place] Access Level entry;Ramped entrance    [place] One level  Home Equipment Wheelchair - manual;Bedside commode;Shower seat;Hospital bed      Prior Function   Level of Independence Needs assistance with ADLs;Needs assistance with gait;Needs assistance with homemaking;Needs assistance with transfers   Can do lateral transfers from EOB to chair   Vocation On disability    Leisure Music, TV    Comments Pt has not walked in 15 years since was in a diabetic coma. Was able to stand some within last 8 years.      Cognition   Overall Cognitive Status Difficult to assess    Difficult to assess due to --   Flat affect, fully blind in both eyes   Behaviors Poor frustration tolerance      Observation/Other  Assessments   Observations Legally blind    Skin Integrity Reported skin breakdown on L foot on heel - pressure ulcer that has blistered (stage 2). Blister has burst and it is now a black sore per caregiver. Being addressed by doctor and caregiver is dressing wound.      Sensation   Light Touch Impaired Detail    Light Touch Impaired Details Impaired RLE;Impaired LLE    Additional Comments Pt with absent sensation in bilateral feet and medial ankles.      ROM / Strength   AROM / PROM / Strength AROM;PROM;Strength      AROM   Overall AROM  Within functional limits for tasks performed      Strength   Overall Strength Deficits    Overall Strength Comments lower extremity strength testing done in supine. Pt did display more movement at times so score may also be affected by cognition as well.    Strength Assessment Site Hip;Knee;Ankle;Shoulder;Elbow    Right/Left Shoulder Right;Left    Right Shoulder Flexion 4/5    Left Shoulder Flexion 4/5    Right/Left Elbow Right;Left    Right Elbow Flexion 4+/5    Right Elbow Extension 4+/5    Left Elbow Flexion 4+/5    Left Elbow Extension 4+/5    Right/Left Hip Right;Left    Right Hip Flexion 2-/5    Right Hip External Rotation  1/5    Right Hip Internal Rotation 1/5    Right Hip ABduction 0/5    Left Hip Flexion 2-/5    Left Hip External Rotation 2-/5    Left Hip Internal Rotation 2-/5    Left Hip ABduction 2-/5    Right/Left Knee Right;Left    Right Knee Flexion 1/5    Right Knee Extension 1/5    Left Knee Flexion 1/5    Left Knee Extension 1/5    Right/Left Ankle Right;Left    Right Ankle Dorsiflexion 0/5    Right Ankle Plantar Flexion 0/5    Right Ankle Inversion 0/5    Right Ankle Eversion 0/5    Left Ankle Dorsiflexion 0/5    Left Ankle Plantar Flexion 0/5    Left Ankle Inversion 0/5    Left Ankle Eversion 0/5      Bed Mobility   Bed Mobility Sitting - Scoot to Edge of Bed;Left Sidelying to Sit;Sit to Supine    Left  Sidelying to Sit Minimal Assistance - Patient >75%   uses arms to help move legs off mat.   Sitting - Scoot to Edge of Bed Minimal Assistance - Patient > 75%    Sit to Supine Moderate Assistance - Patient 50-74%   at legs     Transfers   Transfers Squat Pivot Transfers;Sit to Supine;Lateral/Scoot Transfers  Squat Pivot Transfers 3: Mod assist    Squat Pivot Transfer Details (indicate cue type and reason) Pt cued on hand placement and pushing through UE.    Lateral/Scoot Transfers 4: Min Passenger transport manager Details (indicate cue type and reason) Pt cued on hand placement and pushing through UE.    Sit to Supine 3: Mod assist    Sit to Supine Details (indicate cue type and reason) Tactile cues for placement    Sit to Supine Details  assist at legs      Ambulation/Gait   Ambulation/Gait No      High Level Balance   High Level Balance Comments Sitting edge of mat pt falling back at times needing min assist without back support.                      Objective measurements completed on examination: See above findings.               PT Education - 01/09/20 1133    Education Details Pt educated on POC.    Person(s) Educated Patient;Caregiver(s)    Methods Explanation    Comprehension Verbalized understanding            PT Short Term Goals - 01/08/20 1609      PT SHORT TERM GOAL #1   Title Pt will be able to complete all bed mobility with CGA for increased independence and decreased caregiver burden.    Baseline Min A with bed mobility and mod A for transfers 01/08/20    Time 3    Period Weeks    Status New    Target Date 01/29/20      PT SHORT TERM GOAL #2   Title Pt will be able to perform sit to stand transfer with RW from W/C with max A +1.    Baseline Total Assist 01/08/20    Time 3    Period Weeks    Status New    Target Date 01/29/20      PT SHORT TERM GOAL #3   Title Pt will be able to maintain sitting balance for 5 minutes at  EOB with NMR activities.    Baseline <30 seconds 01/08/20    Time 3    Period Weeks    Status New    Target Date 01/29/20      PT SHORT TERM GOAL #4   Title Pt will be able to complete squat-pivot transfers with CGA for increased independence and decreased caregiver burden.    Baseline Mod A    Time 3    Period Weeks    Status New    Target Date 01/30/20             PT Long Term Goals - 01/08/20 1758      PT LONG TERM GOAL #1   Title Pt will be able to complete all bed mobility with supervision for increased independence and decreased caregiver burden.    Baseline Min A with bed mobility and mod A for transfers 01/08/20    Time 10    Period Weeks    Status New    Target Date 03/18/20      PT LONG TERM GOAL #2   Title Pt will be able to perform sit to stand transfer with RW from W/C with mod A to help with ADLs.    Baseline Total Assist 01/08/20    Time 10    Period Weeks  Status New    Target Date 03/18/20      PT LONG TERM GOAL #3   Title Pt will be able to maintain sitting balance for >8 minutes at EOB with NMR activities.    Baseline <30 seconds 01/08/20    Time 10    Period Weeks    Status New    Target Date 03/18/20      PT LONG TERM GOAL #4   Title Pt will be able to complete squat-pivot transfers with supervision for increased independence and decreased caregiver burden.    Baseline Mod A    Time 10    Period Weeks    Status New    Target Date 03/18/20                  Plan - 01/08/20 1914    Clinical Impression Statement Patient is a 49 y/o male presenting to [facility] with full-time caregiver in manual W/C. Pt has been W/C bound for past 15 years. Pt presents with ESRD, Type-II DM, HTN, legal blindness, schizophrenia, secondary hyperparathyroidism, and quadriparesis, Pt has not walked in 15+ years d/t to diabetic coma and subsequent deconditioning. Upon evaluation, pt is requiring min A for all bed mobility with PT mainly needing to assist with BLE  management and mod A for transfers W/C <> bed/chair. Pt has severe BLE weakness and inability to DF or PF bilaterally. Pt with 2- in hip flexors bilaterally and ability to assist in flexing and extending  knee with PT guiding pt through the motion. Strength and sensation were difficulty to assess d/t cognitive deficits and blindness, however patient with absent sensation along L5-S1 dermatoms and decreased sensation on LLE at L4. Pt would benefit from skilled PT services in order to increase independence with all functional mobility within patient's level of function and decreased caregiver burden.    Personal Factors and Comorbidities Comorbidity 3+;Past/Current Experience;Social Background;Time since onset of injury/illness/exacerbation    Comorbidities ESRD, Type-II DM, HTN, legal blindness, schizophrenia, secondary hyperparathyroidism, and quadriparesis    Examination-Activity Limitations Bed Mobility;Locomotion Level;Transfers;Squat;Stairs;Stand;Toileting    Examination-Participation Restrictions Community Activity;Driving;Interpersonal Relationship;Cleaning;Laundry   Totally dependent on caregiver for these tasks   Stability/Clinical Decision Making Evolving/Moderate complexity    Clinical Decision Making Moderate    Rehab Potential Fair    PT Frequency 1x / week   followed by 2x/week x 3 weeks and 1x/week x 4 weeks   PT Duration 3 weeks   followed by 2x/week x 3 weeks and 1x/week x 4 weeks   PT Treatment/Interventions ADLs/Self Care Home Management;Gait training;Stair training;Functional mobility training;Therapeutic activities;Therapeutic exercise;Balance training;Neuromuscular re-education;DME Instruction;Cognitive remediation;Patient/family education;Wheelchair mobility training;Manual techniques;Visual/perceptual remediation/compensation;Orthotic Fit/Training    PT Next Visit Plan Add more visits in January 2x.week for 3 weeks. Assess standing ability and sitting balance, Continue to work on  transfers with increased participation for patient.    Recommended Other Services OT    Consulted and Agree with Plan of Care Patient;Family member/caregiver    Family Member Consulted Caregiver, Manuela Schwartz           Patient will benefit from skilled therapeutic intervention in order to improve the following deficits and impairments:  Abnormal gait, Decreased range of motion, Difficulty walking, Impaired UE functional use, Decreased activity tolerance, Impaired perceived functional ability, Impaired vision/preception, Decreased balance, Decreased cognition, Decreased strength, Decreased mobility, Impaired sensation  Visit Diagnosis: Muscle weakness (generalized)  Abnormal posture     Problem List Patient Active Problem List   Diagnosis Date Noted  .  Acute respiratory failure with hypoxia ([place]) 03/08/2015  . HCAP (healthcare-associated pneumonia) 03/08/2015  . Chest pain 03/08/2015  . Quadriparesis (muscle weakness)   . ESRD (end stage renal disease) on dialysis ([place]) 02/24/2015  . Constipation, acute 02/24/2015  . Normocytic anemia 02/24/2015  . Occult GI bleeding 02/24/2015  . Hypertension 02/24/2015  . Thrombocytopenia ([place]) 02/24/2015  . Hypothermia 02/24/2015  . Elevated lactic acid level 02/24/2015  . Hyperthyroidism 02/24/2015  . Anemia due to GI blood loss 02/24/2015  . Schizophrenia ([place]) 02/24/2015  . Constipation   . Chronic osteomyelitis ([place]) 06/26/2013    Rosalita Levan, SPT 01/09/2020, 11:52 AM  [facility] [address] [place], [place], [address] Phone: [phone]   Fax:  [phone]  Name: [name] MRN: [id number] Date of Birth: [birth year]

## 2020-01-15 ENCOUNTER — Ambulatory Visit: Payer: Medicaid Other | Admitting: Physical Therapy

## 2020-01-15 ENCOUNTER — Encounter: Payer: Self-pay | Admitting: Physical Therapy

## 2020-01-15 ENCOUNTER — Other Ambulatory Visit: Payer: Self-pay

## 2020-01-15 DIAGNOSIS — R293 Abnormal posture: Secondary | ICD-10-CM

## 2020-01-15 DIAGNOSIS — M6281 Muscle weakness (generalized): Secondary | ICD-10-CM

## 2020-01-15 NOTE — Patient Instructions (Signed)
Access Code: [email or web address] URL: [email or web address] Date: 01/15/2020 Prepared by: Willow Ora  Exercises Beginner Bridge - 1 x daily - 5 x weekly - 1 sets - 5-10 reps Supine Heel Slide - 1 x daily - 5 x weekly - 1 sets - 5-10 reps Seated Heel Raise - 1 x daily - 5 x weekly - 1 sets - 5-10 reps Seated Long Arc Quad - 1 x daily - 5 x weekly - 1 sets - 10 reps

## 2020-01-15 NOTE — Therapy (Signed)
[facility] [address] [place], [place], [address] Phone: [phone]   Fax:  [phone]  Physical Therapy Treatment  Patient Details  Name: [name] MRN: [id number] Date of Birth: 1971/01/06 Referring Provider (PT): Lucianne Lei, MD   Encounter Date: 01/15/2020   PT End of Session - 01/15/20 0815    Visit Number 2    Number of Visits 14    Date for PT Re-Evaluation 03/19/20    Authorization Type Medicaid    Authorization Time Period 3 visits from 01/15/20 through 02/04/20    Authorization - Visit Number 1    Authorization - Number of Visits 3    PT Start Time [phone]   running late for appt today   PT Stop Time 0845    PT Time Calculation (min) 33 min    Equipment Utilized During Treatment Gait belt    Activity Tolerance Patient tolerated treatment well    Behavior During Therapy Flat affect;WFL for tasks assessed/performed           Past Medical History:  Diagnosis Date  . Acute respiratory failure with hypoxia ([place])   . Anemia   . Chest pain   . Constipation   . Depression   . Diabetes mellitus   . ESRD (end stage renal disease) on dialysis [facility])    "[place] off 68; MWF" (02/25/2015)  . HCAP (healthcare-associated pneumonia)   . Hypertension   . Hyperthyroidism   . Hyperthyroidism   . Quadriparesis (muscle weakness)   . Schizo-affective schizophrenia [facility])     Past Surgical History:  Procedure Laterality Date  . DIALYSIS FISTULA CREATION    . MULTIPLE EXTRACTIONS WITH ALVEOLOPLASTY  01/17/2011   Procedure: MULTIPLE EXTRACION WITH ALVEOLOPLASTY;  Surgeon: Gae Bon;  [facility]: [facility] OR;  Service: Oral Surgery;  Laterality: Bilateral;  Extraction of number one, sixteen, seventeen, and thirty two.     There were no vitals filed for this visit.   Subjective Assessment - 01/15/20 0814    Subjective No new complaints. No falls or pain to report.    Patient is accompained by:  Family member   caregiver Collie Siad   Pertinent History PMH: ESRD, Type-II DM, HTN, legal blindness, schizophrenia, secondary hyperparathyroidism, and quadriparesis,    Limitations Sitting;Lifting;Standing;Walking;Writing;House hold activities    Patient Stated Goals Pt would like to be able to walk again. Caregiver would like him to be able to help more with activities.    Currently in Pain? No/denies                 [facility] Adult PT Treatment/Exercise - 01/15/20 0822      Bed Mobility   Bed Mobility Rolling Left    Rolling Left Minimal Assistance - Patient > 75%    Left Sidelying to Sit Minimal Assistance - Patient >75%    Sit to Supine Moderate Assistance - Patient 50-74%    Sit to Supine - Details (indicate cue type and reason) mod assist to go from sitting at edge of mat to supine on mat table with cues on techinque. from supine, min assist with cues to roll onto left side. then assist needed to fully bring LE's off edge of mat while providing cues to use UE to elevate trunk into sitting at edge of mat table with assist to complete this task. pt able to maintain unsupported sitting at edge of mat with supervision both prior to lying down and upon sitting back up.  Transfers   Games developer Transfers;Sit to Supine;Lateral/Scoot Magazine features editor Transfers 3: Mod assist    Squat Pivot Transfer Details (indicate cue type and reason) wheelchair<>mat table with bil knees blocked due to buckling with weight bearing. pt able to hold onto PTA to assist with transfers.      Exercises   Exercises Other Exercises    Other Exercises  in wheelchair: attempted heel raises with pt only able to raise his right heel up. long arc quads with assist for 5 reps on each side; supine on mat table: bridges for 5 reps with minimal lift noted and assist needed for LE stability. then heel slides for 5 reps on each side with assist for control and neutral foot position.          Issued the  following to HEP today: Access Code: [email or web address] URL: [email or web address] Date: 01/15/2020 Prepared by: Willow Ora  Exercises Beginner Bridge - 1 x daily - 5 x weekly - 1 sets - 5-10 reps Supine Heel Slide - 1 x daily - 5 x weekly - 1 sets - 5-10 reps Seated Heel Raise - 1 x daily - 5 x weekly - 1 sets - 5-10 reps Seated Long Arc Quad - 1 x daily - 5 x weekly - 1 sets - 10 reps       PT Education - 01/15/20 0838    Education Details initail HEP for strengthening    Person(s) Educated Patient;Caregiver(s)    Methods Explanation;Demonstration;Tactile cues;Verbal cues;Handout    Comprehension Verbalized understanding;Returned demonstration;Verbal cues required;Tactile cues required;Need further instruction            PT Short Term Goals - 01/08/20 1609      PT SHORT TERM GOAL #1   Title Pt will be able to complete all bed mobility with CGA for increased independence and decreased caregiver burden.    Baseline Min A with bed mobility and mod A for transfers 01/08/20    Time 3    Period Weeks    Status New    Target Date 01/29/20      PT SHORT TERM GOAL #2   Title Pt will be able to perform sit to stand transfer with RW from W/C with max A +1.    Baseline Total Assist 01/08/20    Time 3    Period Weeks    Status New    Target Date 01/29/20      PT SHORT TERM GOAL #3   Title Pt will be able to maintain sitting balance for 5 minutes at EOB with NMR activities.    Baseline <30 seconds 01/08/20    Time 3    Period Weeks    Status New    Target Date 01/29/20      PT SHORT TERM GOAL #4   Title Pt will be able to complete squat-pivot transfers with CGA for increased independence and decreased caregiver burden.    Baseline Mod A    Time 3    Period Weeks    Status New    Target Date 01/30/20             PT Long Term Goals - 01/08/20 1758      PT LONG TERM GOAL #1   Title Pt will be able to complete all bed mobility with supervision for increased  independence and decreased caregiver burden.    Baseline Min A with bed mobility and mod A for transfers 01/08/20  Time 10    Period Weeks    Status New    Target Date 03/18/20      PT LONG TERM GOAL #2   Title Pt will be able to perform sit to stand transfer with RW from W/C with mod A to help with ADLs.    Baseline Total Assist 01/08/20    Time 10    Period Weeks    Status New    Target Date 03/18/20      PT LONG TERM GOAL #3   Title Pt will be able to maintain sitting balance for >8 minutes at EOB with NMR activities.    Baseline <30 seconds 01/08/20    Time 10    Period Weeks    Status New    Target Date 03/18/20      PT LONG TERM GOAL #4   Title Pt will be able to complete squat-pivot transfers with supervision for increased independence and decreased caregiver burden.    Baseline Mod A    Time 10    Period Weeks    Status New    Target Date 03/18/20                 Plan - 01/15/20 0816    Clinical Impression Statement Today's skilled session focused on establishment of an HEP for LE strengthening and continued to address transfer training/bed mobility. No issues noted or reported in session. The pt does need cues to fully participate with treatment interventions as he would at times say "I can't". Needed cues/encouragement to at least try the task being performed. The pt should benefit from continued PT to progress toward unmet goals.    Personal Factors and Comorbidities Comorbidity 3+;Past/Current Experience;Social Background;Time since onset of injury/illness/exacerbation    Comorbidities ESRD, Type-II DM, HTN, legal blindness, schizophrenia, secondary hyperparathyroidism, and quadriparesis    Examination-Activity Limitations Bed Mobility;Locomotion Level;Transfers;Squat;Stairs;Stand;Toileting    Examination-Participation Restrictions Community Activity;Driving;Interpersonal Relationship;Cleaning;Laundry   Totally dependent on caregiver for these tasks    Stability/Clinical Decision Making Evolving/Moderate complexity    Rehab Potential Fair    PT Frequency 1x / week   followed by 2x/week x 3 weeks and 1x/week x 4 weeks   PT Duration 3 weeks   followed by 2x/week x 3 weeks and 1x/week x 4 weeks   PT Treatment/Interventions ADLs/Self Care Home Management;Gait training;Stair training;Functional mobility training;Therapeutic activities;Therapeutic exercise;Balance training;Neuromuscular re-education;DME Instruction;Cognitive remediation;Patient/family education;Wheelchair mobility training;Manual techniques;Visual/perceptual remediation/compensation;Orthotic Fit/Training    PT Next Visit Plan Add more visits in January 2x.week for 3 weeks. Assess standing ability and sitting balance, Continue to work on transfers with increased participation for patient. add to HEP as needed    PT Home Exercise Plan Access Code: [email or web address]    Consulted and Agree with Plan of Care Patient;Family member/caregiver    Family Member Consulted Caregiver, Manuela Schwartz           Patient will benefit from skilled therapeutic intervention in order to improve the following deficits and impairments:  Abnormal gait,Decreased range of motion,Difficulty walking,Impaired UE functional use,Decreased activity tolerance,Impaired perceived functional ability,Impaired vision/preception,Decreased balance,Decreased cognition,Decreased strength,Decreased mobility,Impaired sensation  Visit Diagnosis: Muscle weakness (generalized)  Abnormal posture     Problem List Patient Active Problem List   Diagnosis Date Noted  . Acute respiratory failure with hypoxia ([place]) 03/08/2015  . HCAP (healthcare-associated pneumonia) 03/08/2015  . Chest pain 03/08/2015  . Quadriparesis (muscle weakness)   . ESRD (end stage renal disease) on dialysis ([place]) 02/24/2015  . Constipation, acute 02/24/2015  .  Normocytic anemia 02/24/2015  . Occult GI bleeding 02/24/2015  . Hypertension 02/24/2015  .  Thrombocytopenia ([place]) 02/24/2015  . Hypothermia 02/24/2015  . Elevated lactic acid level 02/24/2015  . Hyperthyroidism 02/24/2015  . Anemia due to GI blood loss 02/24/2015  . Schizophrenia ([place]) 02/24/2015  . Constipation   . Chronic osteomyelitis ([place]) 06/26/2013    Willow Ora, PTA, [facility] [address], [place], [place] [address] [phone] 01/15/20, 4:18 PM   Name: THAI BURGUENO MRN: [id number] Date of Birth: 12-07-1970

## 2020-01-22 ENCOUNTER — Ambulatory Visit: Payer: Medicaid Other

## 2020-01-29 ENCOUNTER — Encounter: Payer: Self-pay | Admitting: Physical Therapy

## 2020-01-29 ENCOUNTER — Other Ambulatory Visit: Payer: Self-pay

## 2020-01-29 ENCOUNTER — Ambulatory Visit: Payer: Medicaid Other | Admitting: Physical Therapy

## 2020-01-29 VITALS — BP 158/92

## 2020-01-29 DIAGNOSIS — M6281 Muscle weakness (generalized): Secondary | ICD-10-CM | POA: Diagnosis not present

## 2020-01-29 DIAGNOSIS — R293 Abnormal posture: Secondary | ICD-10-CM

## 2020-01-29 NOTE — Therapy (Signed)
[facility] [address] [place], [place], [address] Phone: [phone]   Fax:  [phone]  Physical Therapy Treatment  Patient Details  Name: [name] MRN: [id number] Date of Birth: November 28, 1970 Referring Provider (PT): Lucianne Lei, MD   Encounter Date: 01/29/2020   PT End of Session - 01/29/20 1407    Visit Number 3    Number of Visits 14    Date for PT Re-Evaluation 03/19/20    Authorization Type Medicaid    Authorization Time Period 3 visits from 01/15/20 through 02/04/20    Authorization - Visit Number 2    Authorization - Number of Visits 3    PT Start Time 0254    PT Stop Time 2706    PT Time Calculation (min) 40 min    Equipment Utilized During Treatment Gait belt;Other (comment)   Steady lift assist   Activity Tolerance Patient tolerated treatment well;No increased pain;Patient limited by fatigue    Behavior During Therapy Flat affect;WFL for tasks assessed/performed           Past Medical History:  Diagnosis Date  . Acute respiratory failure with hypoxia ([place])   . Anemia   . Chest pain   . Constipation   . Depression   . Diabetes mellitus   . ESRD (end stage renal disease) on dialysis [facility])    "[place] off 68; MWF" (02/25/2015)  . HCAP (healthcare-associated pneumonia)   . Hypertension   . Hyperthyroidism   . Hyperthyroidism   . Quadriparesis (muscle weakness)   . Schizo-affective schizophrenia [facility])     Past Surgical History:  Procedure Laterality Date  . DIALYSIS FISTULA CREATION    . MULTIPLE EXTRACTIONS WITH ALVEOLOPLASTY  01/17/2011   Procedure: MULTIPLE EXTRACION WITH ALVEOLOPLASTY;  Surgeon: Gae Bon;  Location: [facility] OR;  Service: Oral Surgery;  Laterality: Bilateral;  Extraction of number one, sixteen, seventeen, and thirty two.     Vitals:   01/29/20 1432  BP: (!) 158/92     Subjective Assessment - 01/29/20 1407    Subjective No new complaints. No  falls or pain to report.    Patient is accompained by: Family member   caregiver [name]   Pertinent History PMH: ESRD, Type-II DM, HTN, legal blindness, schizophrenia, secondary hyperparathyroidism, and quadriparesis,    Limitations Sitting;Lifting;Standing;Walking;Writing;House hold activities    Patient Stated Goals Pt would like to be able to walk again. Caregiver would like him to be able to help more with activities.    Currently in Pain? No/denies                 [facility] Adult PT Treatment/Exercise - 01/29/20 1409      Transfers   Transfers Sit to Stand;Stand to Sit    Sit to Stand 3: Mod assist;4: Min assist;4: Min guard;2: Max assist;With upper extremity assist;From elevated surface;From bed;From chair/3-in-1;Other (comment)   from Steady lift pads   Sit to Stand Details Tactile cues for initiation;Tactile cues for weight shifting;Verbal cues for sequencing;Verbal cues for technique;Verbal cues for precautions/safety;Manual facilitation for weight shifting;Manual facilitation for placement;Manual facilitation for weight bearing    Sit to Stand Details (indicate cue type and reason) mod/max assist to stand from low wheelchair for SPT to mat table. then mod assist to stand from elevated mat table into the Steady lift assist. from the lift pads pt needed min guard to min assist to stand up tall. Pt noted to have heavy UE reliance with all  stands with decreased weight tolerance/bearing on LE's.    Stand to Sit 4: Min assist;2: Max assist    Stand to Sit Details (indicate cue type and reason) Tactile cues for initiation;Tactile cues for weight shifting;Verbal cues for sequencing;Verbal cues for technique;Verbal cues for precautions/safety;Verbal cues for safe use of DME/AE;Manual facilitation for weight shifting;Manual facilitation for placement;Manual facilitation for weight bearing    Stand to Sit Details min assist to sit to lift pads. otherwise max assist needed for controlled descent to  all other surfaces.    Squat Pivot Transfers 3: Mod assist;With upper extremity assistance    Squat Pivot Transfer Details (indicate cue type and reason) wheelchair>mat table with bil knees blocked due to buckling. use of Steady lift assit to transfer back to wheelchair.      Therapeutic Activites    Therapeutic Activities Other Therapeutic Activities    Other Therapeutic Activities in bloc practice worked on full upright stianing in [place] with emphasis on hip/knee extension, trunk extension and forward head posture. cues/facilitation needed for all of these with heavy UE reliance on the Steady lift assist.      Exercises   Exercises Other Exercises    Other Exercises  seated in wheelchair: bil feet on small rockerboad- had pt work on rocking the board by pushing though toes, then heels with minimal motion noted, however pt able to actively move the board; long arc quads for 5 reps active assist with pt unable to achieve full range without total assistance.      Knee/Hip Exercises: Aerobic   Other Aerobic Scifit UE/LE's level 1.5 x 8 mintues with goal >/= 10 rpm working on reciprocal movements, range of motion with cues to move through full available range and to keep going to maintain speed. Cues only needed once pt was set up from wheelchair with assit to get feet into pads/pedals.                PT Short Term Goals - 01/08/20 1609      PT SHORT TERM GOAL #1   Title Pt will be able to complete all bed mobility with CGA for increased independence and decreased caregiver burden.    Baseline Min A with bed mobility and mod A for transfers 01/08/20    Time 3    Period Weeks    Status New    Target Date 01/29/20      PT SHORT TERM GOAL #2   Title Pt will be able to perform sit to stand transfer with RW from W/C with max A +1.    Baseline Total Assist 01/08/20    Time 3    Period Weeks    Status New    Target Date 01/29/20      PT SHORT TERM GOAL #3   Title Pt will be  able to maintain sitting balance for 5 minutes at EOB with NMR activities.    Baseline <30 seconds 01/08/20    Time 3    Period Weeks    Status New    Target Date 01/29/20      PT SHORT TERM GOAL #4   Title Pt will be able to complete squat-pivot transfers with CGA for increased independence and decreased caregiver burden.    Baseline Mod A    Time 3    Period Weeks    Status New    Target Date 01/30/20             PT Long  Term Goals - 01/08/20 1758      PT LONG TERM GOAL #1   Title Pt will be able to complete all bed mobility with supervision for increased independence and decreased caregiver burden.    Baseline Min A with bed mobility and mod A for transfers 01/08/20    Time 10    Period Weeks    Status New    Target Date 03/18/20      PT LONG TERM GOAL #2   Title Pt will be able to perform sit to stand transfer with RW from W/C with mod A to help with ADLs.    Baseline Total Assist 01/08/20    Time 10    Period Weeks    Status New    Target Date 03/18/20      PT LONG TERM GOAL #3   Title Pt will be able to maintain sitting balance for >8 minutes at EOB with NMR activities.    Baseline <30 seconds 01/08/20    Time 10    Period Weeks    Status New    Target Date 03/18/20      PT LONG TERM GOAL #4   Title Pt will be able to complete squat-pivot transfers with supervision for increased independence and decreased caregiver burden.    Baseline Mod A    Time 10    Period Weeks    Status New    Target Date 03/18/20                 Plan - 01/29/20 1409    Clinical Impression Statement Today's skilled session continued to focus on LE strengthening and began to address standing with use of Steady lift assist. With the lift pt needed min assist, otherwise continues to need up to mod/max assist for transfers. The pt was participatory with entire session today. The pt should benefit from contineud PT to progress toward unmet goals.    Personal Factors and  Comorbidities Comorbidity 3+;Past/Current Experience;Social Background;Time since onset of injury/illness/exacerbation    Comorbidities ESRD, Type-II DM, HTN, legal blindness, schizophrenia, secondary hyperparathyroidism, and quadriparesis    Examination-Activity Limitations Bed Mobility;Locomotion Level;Transfers;Squat;Stairs;Stand;Toileting    Examination-Participation Restrictions Community Activity;Driving;Interpersonal Relationship;Cleaning;Laundry   Totally dependent on caregiver for these tasks   Stability/Clinical Decision Making Evolving/Moderate complexity    Rehab Potential Fair    PT Frequency 1x / week   followed by 2x/week x 3 weeks and 1x/week x 4 weeks   PT Duration 3 weeks   followed by 2x/week x 3 weeks and 1x/week x 4 weeks   PT Treatment/Interventions ADLs/Self Care Home Management;Gait training;Stair training;Functional mobility training;Therapeutic activities;Therapeutic exercise;Balance training;Neuromuscular re-education;DME Instruction;Cognitive remediation;Patient/family education;Wheelchair mobility training;Manual techniques;Visual/perceptual remediation/compensation;Orthotic Fit/Training    PT Next Visit Plan Add more visits in January 2x.week for 3 weeks if continueing, need to submit for more visits once scheduled. check STGs (did not check this visit as pt has one visit left approved and time left within the approval due to a missed week).    PT Home Exercise Plan Access Code: [email or web address]    [email or web address] and Agree with Plan of Care Patient;Family member/caregiver    Family Member Consulted Caregiver, [name]           Patient will benefit from skilled therapeutic intervention in order to improve the following deficits and impairments:  Abnormal gait,Decreased range of motion,Difficulty walking,Impaired UE functional use,Decreased activity tolerance,Impaired perceived functional ability,Impaired vision/preception,Decreased balance,Decreased cognition,Decreased  strength,Decreased mobility,Impaired sensation  Visit Diagnosis: Muscle weakness (generalized)  Abnormal posture     Problem List Patient Active Problem List   Diagnosis Date Noted  . Acute respiratory failure with hypoxia ([place]) 03/08/2015  . HCAP (healthcare-associated pneumonia) 03/08/2015  . Chest pain 03/08/2015  . Quadriparesis (muscle weakness)   . ESRD (end stage renal disease) on dialysis ([place]) 02/24/2015  . Constipation, acute 02/24/2015  . Normocytic anemia 02/24/2015  . Occult GI bleeding 02/24/2015  . Hypertension 02/24/2015  . Thrombocytopenia ([place]) 02/24/2015  . Hypothermia 02/24/2015  . Elevated lactic acid level 02/24/2015  . Hyperthyroidism 02/24/2015  . Anemia due to GI blood loss 02/24/2015  . Schizophrenia ([place]) 02/24/2015  . Constipation   . Chronic osteomyelitis ([place]) 06/26/2013    Willow Ora, PTA, [facility] [address] [place], [place] [address] [phone] 01/29/20, 4:47 PM   Name: [name] MRN: [id number] Date of Birth: [birth year]

## 2020-02-03 ENCOUNTER — Ambulatory Visit: Payer: Medicaid Other

## 2020-02-04 ENCOUNTER — Other Ambulatory Visit: Payer: Self-pay

## 2020-02-04 ENCOUNTER — Ambulatory Visit: Payer: Medicaid Other

## 2020-02-04 DIAGNOSIS — M6281 Muscle weakness (generalized): Secondary | ICD-10-CM

## 2020-02-04 DIAGNOSIS — R293 Abnormal posture: Secondary | ICD-10-CM

## 2020-02-04 NOTE — Therapy (Signed)
[facility] [address] [place], [place], [address] Phone: [phone]   Fax:  [phone]  Physical Therapy Treatment  Patient Details  Name: [name] MRN: [id number] Date of Birth: 08/02/1970 Referring Provider (PT): Lucianne Lei, MD   Encounter Date: 02/04/2020   PT End of Session - 02/04/20 0945    Visit Number 4    Number of Visits 14    Date for PT Re-Evaluation 03/19/20    Authorization Type Medicaid    Authorization Time Period 3 visits from 01/15/20 through 02/04/20    Authorization - Visit Number 3    Authorization - Number of Visits 3    PT Start Time 5732   Pt arrived late   PT Stop Time 1016    PT Time Calculation (min) 32 min    Equipment Utilized During Treatment Gait belt;Other (comment)   Steady lift assist   Activity Tolerance Patient tolerated treatment well;No increased pain;Patient limited by fatigue    Behavior During Therapy Flat affect;WFL for tasks assessed/performed           Past Medical History:  Diagnosis Date  . Acute respiratory failure with hypoxia ([place])   . Anemia   . Chest pain   . Constipation   . Depression   . Diabetes mellitus   . ESRD (end stage renal disease) on dialysis [facility])    "[place] off 68; MWF" (02/25/2015)  . HCAP (healthcare-associated pneumonia)   . Hypertension   . Hyperthyroidism   . Hyperthyroidism   . Quadriparesis (muscle weakness)   . Schizo-affective schizophrenia [facility])     Past Surgical History:  Procedure Laterality Date  . DIALYSIS FISTULA CREATION    . MULTIPLE EXTRACTIONS WITH ALVEOLOPLASTY  01/17/2011   Procedure: MULTIPLE EXTRACION WITH ALVEOLOPLASTY;  Surgeon: Gae Bon;  Location: [facility] OR;  Service: Oral Surgery;  Laterality: Bilateral;  Extraction of number one, sixteen, seventeen, and thirty two.     There were no vitals filed for this visit.   Subjective Assessment - 02/04/20 0945    Subjective Pt  reports that his muscles were a little sore just from working out last time but not bad.    Patient is accompained by: Family member   caregiver Collie Siad   Pertinent History PMH: ESRD, Type-II DM, HTN, legal blindness, schizophrenia, secondary hyperparathyroidism, and quadriparesis,    Limitations Sitting;Lifting;Standing;Walking;Writing;House hold activities    Patient Stated Goals Pt would like to be able to walk again. Caregiver would like him to be able to help more with activities.    Currently in Pain? No/denies                             [facility] Adult PT Treatment/Exercise - 02/04/20 0946      Bed Mobility   Bed Mobility Rolling Right;Supine to Sit;Sit to Supine    Rolling Right Independent with assistive device    Supine to Sit Supervision/Verbal cueing   assists legs off with arms   Sit to Supine Minimal Assistance - Patient > 75%   at legs     Transfers   Transfers Squat Pivot Transfers;Stand to Sit;Sit to Stand    Sit to Stand 3: Mod assist    Sit to Stand Details Tactile cues for placement    Sit to Stand Details (indicate cue type and reason) Sit to stand from elevated mat at [facility] pulling on bars mod assist after  PT helped pt to place hand in proper spot. Pt was more challenged today with trying to get more upright only able to on 2 of his sit to stands from [place]. Was leaning more over bar the other rises despite tactile and verbal cues to try to straighten up more.    Stand to Sit 3: Mod assist    Stand to Sit Details holding to [employer] Transfers 4: Min Financial risk analyst Details (indicate cue type and reason) w/c to/from mat with verbal and tactile assist for hand placement and to block legs. PT also removed arm rest on w/c with return to chair and pt able to get over with a couple scoots.    Comments Pt sat edge of mat >5 min. Added in manual pertubations in varied directions the last minute with hands in lap throughout. Pt had 1  LOB to the left with pertubation but able to regain balance catching with left hand. Pt performed coming down on elbow to each side supervision.      Therapeutic Activites    Therapeutic Activities Other Therapeutic Activities    Other Therapeutic Activities Standing at [facility] x 6 reps with longest bout 10 sec. Pt was given verbal and tactile cues to try to stand more erect as leaning over Stedy bar at times with heavy UE reliance.                  PT Education - 02/04/20 1309    Education Details PT discussed plan to request auth for more visits    Person(s) Educated Patient;Child(ren)    Methods Explanation    Comprehension Verbalized understanding            PT Short Term Goals - 02/04/20 1310      PT SHORT TERM GOAL #1   Title Pt will be able to complete all bed mobility with CGA for increased independence and decreased caregiver burden.    Baseline Min A with bed mobility and mod A for transfers 01/08/20. 02/04/20 min assist with sit to supine, supervision with rolling and supine to sit.    Time 3    Period Weeks    Status Partially Met    Target Date 01/29/20      PT SHORT TERM GOAL #2   Title Pt will be able to perform sit to stand transfer with RW from W/C with max A +1.    Baseline Total Assist 01/08/20. 02/04/20 mod assist to rise at [facility] pulling on bar with legs blocked.    Time 3    Period Weeks    Status On-going    Target Date 01/29/20      PT SHORT TERM GOAL #3   Title Pt will be able to maintain sitting balance for 5 minutes at EOB with NMR activities.    Baseline <30 seconds 01/08/20. 02/04/20 >5 min supervision at edge of mat.    Time 3    Period Weeks    Status Achieved    Target Date 01/29/20      PT SHORT TERM GOAL #4   Title Pt will be able to complete squat-pivot transfers with CGA for increased independence and decreased caregiver burden.    Baseline min assist w/c to/from mat    Time 3    Period Weeks    Status On-going    Target Date  01/30/20  PT Long Term Goals - 01/08/20 1758      PT LONG TERM GOAL #1   Title Pt will be able to complete all bed mobility with supervision for increased independence and decreased caregiver burden.    Baseline Min A with bed mobility and mod A for transfers 01/08/20    Time 10    Period Weeks    Status New    Target Date 03/18/20      PT LONG TERM GOAL #2   Title Pt will be able to perform sit to stand transfer with RW from W/C with mod A to help with ADLs.    Baseline Total Assist 01/08/20    Time 10    Period Weeks    Status New    Target Date 03/18/20      PT LONG TERM GOAL #3   Title Pt will be able to maintain sitting balance for >8 minutes at EOB with NMR activities.    Baseline <30 seconds 01/08/20    Time 10    Period Weeks    Status New    Target Date 03/18/20      PT LONG TERM GOAL #4   Title Pt will be able to complete squat-pivot transfers with supervision for increased independence and decreased caregiver burden.    Baseline Mod A    Time 10    Period Weeks    Status New    Target Date 03/18/20                 Plan - 02/04/20 1312    Clinical Impression Statement PT reassessed STGs today. Pt making progress towards all goals requiring decreased assistance with bed mobility and transfers. He met sitting balance goal able to maintain upright >5 min. Pt has started to work on standing using [place] to block legs and pulling up on bar needing mod assist to rise. Pt relying heavily on UE support at this time. Pt will continue to benefit from skilled PT to continue to progress functional mobility to maximize function.    Personal Factors and Comorbidities Comorbidity 3+;Past/Current Experience;Social Background;Time since onset of injury/illness/exacerbation    Comorbidities ESRD, Type-II DM, HTN, legal blindness, schizophrenia, secondary hyperparathyroidism, and quadriparesis    Examination-Activity Limitations Bed Mobility;Locomotion  Level;Transfers;Squat;Stairs;Stand;Toileting    Examination-Participation Restrictions Community Activity;Driving;Interpersonal Relationship;Cleaning;Laundry   Totally dependent on caregiver for these tasks   Stability/Clinical Decision Making Evolving/Moderate complexity    Rehab Potential Fair    PT Frequency 1x / week   followed by 2x/week x 3 weeks and 1x/week x 4 weeks   PT Duration 3 weeks   followed by 2x/week x 3 weeks and 1x/week x 4 weeks   PT Treatment/Interventions ADLs/Self Care Home Management;Gait training;Stair training;Functional mobility training;Therapeutic activities;Therapeutic exercise;Balance training;Neuromuscular re-education;DME Instruction;Cognitive remediation;Patient/family education;Wheelchair mobility training;Manual techniques;Visual/perceptual remediation/compensation;Orthotic Fit/Training    PT Next Visit Plan Continue with functional strengthening with transfers, sitting balance activities, standing at [place]    PT Home Exercise Plan Access Code: [email or web address]    Consulted and Agree with Plan of Care Patient;Family member/caregiver    Family Member Consulted Caregiver, Manuela Schwartz           Patient will benefit from skilled therapeutic intervention in order to improve the following deficits and impairments:  Abnormal gait,Decreased range of motion,Difficulty walking,Impaired UE functional use,Decreased activity tolerance,Impaired perceived functional ability,Impaired vision/preception,Decreased balance,Decreased cognition,Decreased strength,Decreased mobility,Impaired sensation  Visit Diagnosis: Muscle weakness (generalized)  Abnormal posture     Problem List Patient Active  Problem List   Diagnosis Date Noted  . Acute respiratory failure with hypoxia ([place]) 03/08/2015  . HCAP (healthcare-associated pneumonia) 03/08/2015  . Chest pain 03/08/2015  . Quadriparesis (muscle weakness)   . ESRD (end stage renal disease) on dialysis ([place]) 02/24/2015  .  Constipation, acute 02/24/2015  . Normocytic anemia 02/24/2015  . Occult GI bleeding 02/24/2015  . Hypertension 02/24/2015  . Thrombocytopenia ([place]) 02/24/2015  . Hypothermia 02/24/2015  . Elevated lactic acid level 02/24/2015  . Hyperthyroidism 02/24/2015  . Anemia due to GI blood loss 02/24/2015  . Schizophrenia ([place]) 02/24/2015  . Constipation   . Chronic osteomyelitis ([place]) 06/26/2013    Electa Sniff, PT, DPT, NCS 02/04/2020, 1:24 PM  [facility] [address] [place], [place], [address] Phone: [phone]   Fax:  [phone]  Name: [name] MRN: [id number] Date of Birth: November 04, 1970

## 2020-02-17 ENCOUNTER — Other Ambulatory Visit: Payer: Self-pay

## 2020-02-17 ENCOUNTER — Ambulatory Visit: Payer: Medicaid Other | Attending: Family Medicine

## 2020-02-17 ENCOUNTER — Ambulatory Visit: Payer: Medicaid Other

## 2020-02-17 DIAGNOSIS — M6281 Muscle weakness (generalized): Secondary | ICD-10-CM | POA: Diagnosis present

## 2020-02-17 DIAGNOSIS — R293 Abnormal posture: Secondary | ICD-10-CM

## 2020-02-17 NOTE — Therapy (Signed)
[facility] [address] [place], [place], [address] Phone: [phone]   Fax:  [phone]  Physical Therapy Treatment  Patient Details  Name: [name] MRN: [id number] Date of Birth: January 23, 1971 Referring Provider (PT): Lucianne Lei, MD   Encounter Date: 02/17/2020   PT End of Session - 02/17/20 1324    Visit Number 5    Number of Visits 14    Date for PT Re-Evaluation 03/19/20    Authorization Type Medicaid    Authorization Time Period 3 visits from 01/15/20 through 02/04/20    Authorization - Visit Number 4    Authorization - Number of Visits 3    PT Start Time 1300    PT Stop Time 8588    PT Time Calculation (min) 45 min    Equipment Utilized During Treatment Gait belt;Other (comment)   Steady lift assist   Activity Tolerance Patient tolerated treatment well;No increased pain;Patient limited by fatigue    Behavior During Therapy Flat affect;WFL for tasks assessed/performed           Past Medical History:  Diagnosis Date  . Acute respiratory failure with hypoxia ([place])   . Anemia   . Chest pain   . Constipation   . Depression   . Diabetes mellitus   . ESRD (end stage renal disease) on dialysis [facility])    "[place] off 68; MWF" (02/25/2015)  . HCAP (healthcare-associated pneumonia)   . Hypertension   . Hyperthyroidism   . Hyperthyroidism   . Quadriparesis (muscle weakness)   . Schizo-affective schizophrenia [facility])     Past Surgical History:  Procedure Laterality Date  . DIALYSIS FISTULA CREATION    . MULTIPLE EXTRACTIONS WITH ALVEOLOPLASTY  01/17/2011   Procedure: MULTIPLE EXTRACION WITH ALVEOLOPLASTY;  Surgeon: Gae Bon;  Location: [facility] OR;  Service: Oral Surgery;  Laterality: Bilateral;  Extraction of number one, sixteen, seventeen, and thirty two.     There were no vitals filed for this visit.   Subjective Assessment - 02/17/20 1257    Subjective PT and caregiver reports  that he got locked in the car this morning so that is why he has bandages on digit 4 and 5 on right hand from scaping as he was trying to open the car door.    Patient is accompained by: Family member   caregiver Collie Siad   Pertinent History PMH: ESRD, Type-II DM, HTN, legal blindness, schizophrenia, secondary hyperparathyroidism, and quadriparesis,    Limitations Sitting;Lifting;Standing;Walking;Writing;House hold activities    Patient Stated Goals Pt would like to be able to walk again. Caregiver would like him to be able to help more with activities.               Functional mobility Chair to bed: max A with transfer, pt able to push off with UE but needs max A due to weakness in legs and for stability at knees Supine to L sidelying log roll: tactile and verbal cues: 5x  Bed to chair: slide board transfers: CGA to min A: 6x  Therex: Supine manually resisted lower trunk rotations: 2 x 10, with isometric hold at end range and quick stretch in lengthened position to faciliate mm contraction Supine manually resisted hip abduction and adduction: hooklying: 2 x 10 Supine bridge: 2 x 8, bil hip approximation for mm facilitation through knees Supine double knee to chest: active: 2 x 8  PT Education - 02/17/20 1336    Education Details Access Code: [email or web address]  URL: [email or web address]  Date: 02/17/2020  Prepared by: Markus Jarvis    Exercises  Bilateral Bent Leg Lift - 1 x daily - 7 x weekly - 2 sets - 8 reps  Supine Bridge - 1 x daily - 7 x weekly - 2 sets - 10 reps  Bent Knee Fallouts - 1 x daily - 7 x weekly - 2 sets - 10 reps            PT Short Term Goals - 02/04/20 1310      PT SHORT TERM GOAL #1   Title Pt will be able to complete all bed mobility with CGA for increased independence and decreased caregiver burden.    Baseline Min A with bed mobility and mod A for transfers 01/08/20. 02/04/20 min assist with sit to supine,  supervision with rolling and supine to sit.    Time 3    Period Weeks    Status Partially Met    Target Date 01/29/20      PT SHORT TERM GOAL #2   Title Pt will be able to perform sit to stand transfer with RW from W/C with max A +1.    Baseline Total Assist 01/08/20. 02/04/20 mod assist to rise at [facility] pulling on bar with legs blocked.    Time 3    Period Weeks    Status On-going    Target Date 01/29/20      PT SHORT TERM GOAL #3   Title Pt will be able to maintain sitting balance for 5 minutes at EOB with NMR activities.    Baseline <30 seconds 01/08/20. 02/04/20 >5 min supervision at edge of mat.    Time 3    Period Weeks    Status Achieved    Target Date 01/29/20      PT SHORT TERM GOAL #4   Title Pt will be able to complete squat-pivot transfers with CGA for increased independence and decreased caregiver burden.    Baseline min assist w/c to/from mat    Time 3    Period Weeks    Status On-going    Target Date 01/30/20             PT Long Term Goals - 01/08/20 1758      PT LONG TERM GOAL #1   Title Pt will be able to complete all bed mobility with supervision for increased independence and decreased caregiver burden.    Baseline Min A with bed mobility and mod A for transfers 01/08/20    Time 10    Period Weeks    Status New    Target Date 03/18/20      PT LONG TERM GOAL #2   Title Pt will be able to perform sit to stand transfer with RW from W/C with mod A to help with ADLs.    Baseline Total Assist 01/08/20    Time 10    Period Weeks    Status New    Target Date 03/18/20      PT LONG TERM GOAL #3   Title Pt will be able to maintain sitting balance for >8 minutes at EOB with NMR activities.    Baseline <30 seconds 01/08/20    Time 10    Period Weeks    Status New    Target Date 03/18/20      PT LONG TERM GOAL #4   Title Pt will  be able to complete squat-pivot transfers with supervision for increased independence and decreased caregiver burden.     Baseline Mod A    Time 10    Period Weeks    Status New    Target Date 03/18/20                 Plan - 02/17/20 1325    Clinical Impression Statement Pt tolerated session well. Pt able to perform slide board transfer with min A only. Pt and caregiver educated on doing slide board transfer at home to improve independence and functional strength and reduce caregiver burden. Updated HEP    Comorbidities ESRD, Type-II DM, HTN, legal blindness, schizophrenia, secondary hyperparathyroidism, and quadriparesis    Examination-Activity Limitations Bed Mobility;Locomotion Level;Transfers;Squat;Stairs;Stand;Toileting    Examination-Participation Restrictions Community Activity;Driving;Interpersonal Relationship;Cleaning;Laundry    Stability/Clinical Decision Making Evolving/Moderate complexity    Rehab Potential Fair    PT Frequency 1x / week    PT Duration 3 weeks    PT Treatment/Interventions ADLs/Self Care Home Management;Gait training;Stair training;Functional mobility training;Therapeutic activities;Therapeutic exercise;Balance training;Neuromuscular re-education;DME Instruction;Cognitive remediation;Patient/family education;Wheelchair mobility training;Manual techniques;Visual/perceptual remediation/compensation;Orthotic Fit/Training    PT Next Visit Plan Continue with functional strengthening with transfers, sitting balance activities, standing at [place], Sci[employer]    PT Home Exercise Plan Access Code: [facility]; [email or web address]           Patient will benefit from skilled therapeutic intervention in order to improve the following deficits and impairments:  Abnormal gait,Decreased range of motion,Difficulty walking,Impaired UE functional use,Decreased activity tolerance,Impaired perceived functional ability,Impaired vision/preception,Decreased balance,Decreased cognition,Decreased strength,Decreased mobility,Impaired sensation  Visit Diagnosis: Muscle weakness (generalized)  Abnormal  posture     Problem List Patient Active Problem List   Diagnosis Date Noted  . Acute respiratory failure with hypoxia ([place]) 03/08/2015  . HCAP (healthcare-associated pneumonia) 03/08/2015  . Chest pain 03/08/2015  . Quadriparesis (muscle weakness)   . ESRD (end stage renal disease) on dialysis ([place]) 02/24/2015  . Constipation, acute 02/24/2015  . Normocytic anemia 02/24/2015  . Occult GI bleeding 02/24/2015  . Hypertension 02/24/2015  . Thrombocytopenia ([place]) 02/24/2015  . Hypothermia 02/24/2015  . Elevated lactic acid level 02/24/2015  . Hyperthyroidism 02/24/2015  . Anemia due to GI blood loss 02/24/2015  . Schizophrenia ([facility]) 02/24/2015  . Constipation   . Chronic osteomyelitis ([place]) 06/26/2013    Kerrie Pleasure, PT 02/17/2020, 1:47 PM  [facility] [address] [place], [place], [address] Phone: [phone]   Fax:  [phone]  Name: WYLAND RASTETTER MRN: [id number] Date of Birth: [birth year]

## 2020-02-19 ENCOUNTER — Ambulatory Visit: Payer: Medicaid Other

## 2020-02-24 ENCOUNTER — Other Ambulatory Visit: Payer: Self-pay

## 2020-02-24 ENCOUNTER — Ambulatory Visit: Payer: Medicaid Other | Admitting: Physical Therapy

## 2020-02-24 ENCOUNTER — Encounter: Payer: Self-pay | Admitting: Physical Therapy

## 2020-02-24 DIAGNOSIS — M6281 Muscle weakness (generalized): Secondary | ICD-10-CM | POA: Diagnosis not present

## 2020-02-24 DIAGNOSIS — R293 Abnormal posture: Secondary | ICD-10-CM

## 2020-02-24 NOTE — Therapy (Addendum)
[facility] [address] [place], [place], [address] Phone: [phone]   Fax:  [phone]  Physical Therapy Treatment  Patient Details  Name: [name] MRN: [id number] Date of Birth: 23-Oct-1970 Referring Provider (PT): Lucianne Lei, MD   Encounter Date: 02/24/2020   PT End of Session - 02/24/20 0946    Visit Number 6    Number of Visits 14    Date for PT Re-Evaluation 03/19/20    Authorization Type Medicaid    Authorization Time Period 3 visits from 01/15/20 through 02/04/20; 3 visist from 02/17/20-03/08/20    Authorization - Visit Number 2    Authorization - Number of Visits 3    PT Start Time 0945   pt running late due to icy weather   PT Stop Time 1015    PT Time Calculation (min) 30 min    Equipment Utilized During Treatment Gait belt;Other (comment)   Steady lift assist   Activity Tolerance Patient tolerated treatment well;No increased pain;Patient limited by fatigue    Behavior During Therapy Flat affect;WFL for tasks assessed/performed           Past Medical History:  Diagnosis Date  . Acute respiratory failure with hypoxia ([place])   . Anemia   . Chest pain   . Constipation   . Depression   . Diabetes mellitus   . ESRD (end stage renal disease) on dialysis [facility])    "[place] off 68; MWF" (02/25/2015)  . HCAP (healthcare-associated pneumonia)   . Hypertension   . Hyperthyroidism   . Hyperthyroidism   . Quadriparesis (muscle weakness)   . Schizo-affective schizophrenia [facility])     Past Surgical History:  Procedure Laterality Date  . DIALYSIS FISTULA CREATION    . MULTIPLE EXTRACTIONS WITH ALVEOLOPLASTY  01/17/2011   Procedure: MULTIPLE EXTRACION WITH ALVEOLOPLASTY;  Surgeon: Gae Bon;  Location: [facility] OR;  Service: Oral Surgery;  Laterality: Bilateral;  Extraction of number one, sixteen, seventeen, and thirty two.     There were no vitals filed for this visit.   Subjective  Assessment - 02/24/20 0945    Subjective No new complaints. No falls. Has not done the new ex's as he reports he "was getting over a cold".    Patient is accompained by: Family member   caregiver Collie Siad   Pertinent History PMH: ESRD, Type-II DM, HTN, legal blindness, schizophrenia, secondary hyperparathyroidism, and quadriparesis,    Limitations Sitting;Lifting;Standing;Walking;Writing;House hold activities    Patient Stated Goals Pt would like to be able to walk again. Caregiver would like him to be able to help more with activities.    Currently in Pain? No/denies                [facility] Adult PT Treatment/Exercise - 02/24/20 0947      Transfers   Transfers Sit to Stand;Stand to Sit;Stand Pivot Transfers    Sit to Stand 3: Mod assist;With upper extremity assist;From bed;From chair/3-in-1    Sit to Stand Details Verbal cues for technique;Verbal cues for sequencing;Manual facilitation for weight shifting;Tactile cues for weight shifting;Tactile cues for initiation    Sit to Stand Details (indicate cue type and reason) cues to scoot to edge, for anterior weight shifting and to power up through LE's with standing. cues for knee and trunk extension once in standing.    Stand to Sit 3: Mod assist;With upper extremity assist;To bed;To chair/3-in-1;Uncontrolled descent    Stand to Sit Details (indicate cue type and reason)  Tactile cues for initiation;Tactile cues for weight shifting;Verbal cues for sequencing;Verbal cues for technique;Manual facilitation for weight shifting;Manual facilitation for weight bearing    Stand to Sit Details cues for slow, controlled descent with assistance needed.    Squat Pivot Transfers 3: Mod assist;2: Max assist;With upper extremity assistance    Squat Pivot Transfer Details (indicate cue type and reason) max assist from wheelchair to edge of mat, then mod assist from edge of mat to wheelchair. cues needed on technique, knee/trunk extension with knees blocked due to  buckling with transfer toward mat.      Exercises   Exercises Other Exercises    Other Exercises  seated at edge of mat to work on unsupported sitting concurrent with LE strengthening ex's: with feet on rocker board in ant/post direction had pt "rock" the board to work on ankle strengthening/ROM with assistance needed for minimal movements; then with foot on pillowcase worked on sliding foot backwards/forwards for 5-6 reps on each leg with min assistance needed for controlling path of movements with pt activly moving foot.      Knee/Hip Exercises: Aerobic   Other Aerobic Scifit UE/LE's level 1.5 x 8 mintues with goal 15-20 rpm working on reciprocal movements, range of motion with cues to move through full available range and to keep going to maintain speed. .                PT Short Term Goals - 02/24/20 1046      PT SHORT TERM GOAL #1   Title Pt will be able to complete all bed mobility with CGA for increased independence and decreased caregiver burden.    Baseline Min A with bed mobility and mod A for transfers 01/08/20. 02/04/20 min assist with sit to supine, supervision with rolling and supine to sit.    Status Partially Met      PT SHORT TERM GOAL #2   Title Pt will be able to perform sit to stand transfer with RW from W/C with max A +1.    Baseline Total Assist 01/08/20. 02/04/20 mod assist to rise at [facility] pulling on bar with legs blocked.    Status Not Met      PT SHORT TERM GOAL #3   Title Pt will be able to maintain sitting balance for 5 minutes at EOB with NMR activities.    Baseline <30 seconds 01/08/20. 02/04/20 >5 min supervision at edge of mat.    Status Achieved      PT SHORT TERM GOAL #4   Title Pt will be able to complete squat-pivot transfers with CGA for increased independence and decreased caregiver burden.    Baseline min assist w/c to/from mat    Status Not Met             PT Long Term Goals - 01/08/20 1758      PT LONG TERM GOAL #1   Title Pt will  be able to complete all bed mobility with supervision for increased independence and decreased caregiver burden.    Baseline Min A with bed mobility and mod A for transfers 01/08/20    Time 10    Period Weeks    Status New    Target Date 03/18/20      PT LONG TERM GOAL #2   Title Pt will be able to perform sit to stand transfer with RW from W/C with mod A to help with ADLs.    Baseline Total Assist 01/08/20. Addend: 02/24/20 Pt is  mod/ max assist to stand at [facility].   Time 10    Period Weeks    Status New    Target Date 03/18/20      PT LONG TERM GOAL #3   Title Pt will be able to maintain sitting balance for >8 minutes at EOB with NMR activities.    Baseline <30 seconds 01/08/20. Addend: 02/24/20 Pt sat >5 min with activities   Time 10    Period Weeks    Status New    Target Date 03/18/20      PT LONG TERM GOAL #4   Title Pt will be able to complete squat-pivot transfers with supervision for increased independence and decreased caregiver burden.    Baseline Mod A . Addend: 02/24/20 min/mod assist   Time 10    Period Weeks    Status New    Target Date 03/18/20                 Plan - 02/24/20 0947    Clinical Impression Statement Today's skilled session continued to focus on transfer training and LE strengthening with no issues noted or reported by pt in session. Pt continues to need mod/max assist with transfers except for slide board transfers which are min guard assist. Pt's caregiver reports they have not used the slide board at home since it was done in therapy last session.  Addendum: Pt will continue to benefit from skilled PT to continue to work on improving functional mobility and strength.   Comorbidities ESRD, Type-II DM, HTN, legal blindness, schizophrenia, secondary hyperparathyroidism, and quadriparesis    Examination-Activity Limitations Bed Mobility;Locomotion Level;Transfers;Squat;Stairs;Stand;Toileting    Examination-Participation Restrictions Community  Activity;Driving;Interpersonal Relationship;Cleaning;Laundry    Stability/Clinical Decision Making Evolving/Moderate complexity    Rehab Potential Fair    PT Frequency 1x / week , followed by 2x/week for 3 weeks then 1x/week for 4 weeks   PT Duration 3 weeks    PT Treatment/Interventions ADLs/Self Care Home Management;Gait training;Stair training;Functional mobility training;Therapeutic activities;Therapeutic exercise;Balance training;Neuromuscular re-education;DME Instruction;Cognitive remediation;Patient/family education;Wheelchair mobility training;Manual techniques;Visual/perceptual remediation/compensation;Orthotic Fit/Training    PT Next Visit Plan need to submit for more visits after next session with updated baselines. Continue with functional strengthening with transfers, sitting balance activities, standing at [place], [employer]    PT Home Exercise Plan Access Code: [facility]; [email or web address]           Patient will benefit from skilled therapeutic intervention in order to improve the following deficits and impairments:  Abnormal gait,Decreased range of motion,Difficulty walking,Impaired UE functional use,Decreased activity tolerance,Impaired perceived functional ability,Impaired vision/preception,Decreased balance,Decreased cognition,Decreased strength,Decreased mobility,Impaired sensation  Visit Diagnosis: Muscle weakness (generalized)  Abnormal posture     Problem List Patient Active Problem List   Diagnosis Date Noted  . Acute respiratory failure with hypoxia ([facility]) 03/08/2015  . HCAP (healthcare-associated pneumonia) 03/08/2015  . Chest pain 03/08/2015  . Quadriparesis (muscle weakness)   . ESRD (end stage renal disease) on dialysis ([facility]) 02/24/2015  . Constipation, acute 02/24/2015  . Normocytic anemia 02/24/2015  . Occult GI bleeding 02/24/2015  . Hypertension 02/24/2015  . Thrombocytopenia ([facility]) 02/24/2015  . Hypothermia 02/24/2015  . Elevated lactic acid level 02/24/2015  .  Hyperthyroidism 02/24/2015  . Anemia due to GI blood loss 02/24/2015  . Schizophrenia ([facility]) 02/24/2015  . Constipation   . Chronic osteomyelitis ([facility]) 06/26/2013    Sallyanne Kuster, PTA, [facility] [address] [place], [place] [address] [phone] 02/24/20, 10:51 AM   Elmer Bales, PT, DPT, NCS  Name: [name] MRN: [id number] Date of Birth: May 05, 1970

## 2020-02-26 ENCOUNTER — Ambulatory Visit: Payer: Medicaid Other

## 2020-03-02 ENCOUNTER — Ambulatory Visit: Payer: Medicaid Other

## 2020-03-04 ENCOUNTER — Ambulatory Visit: Payer: Medicaid Other | Admitting: Physical Therapy

## 2020-03-09 ENCOUNTER — Ambulatory Visit: Payer: Medicaid Other

## 2020-03-11 ENCOUNTER — Ambulatory Visit: Payer: Medicaid Other

## 2020-03-16 ENCOUNTER — Ambulatory Visit: Payer: Medicaid Other | Attending: Family Medicine

## 2020-03-16 DIAGNOSIS — R293 Abnormal posture: Secondary | ICD-10-CM | POA: Insufficient documentation

## 2020-03-16 DIAGNOSIS — M6281 Muscle weakness (generalized): Secondary | ICD-10-CM | POA: Insufficient documentation

## 2020-03-23 ENCOUNTER — Ambulatory Visit: Payer: Medicaid Other

## 2020-03-29 ENCOUNTER — Encounter (HOSPITAL_COMMUNITY): Payer: Self-pay

## 2020-03-29 ENCOUNTER — Emergency Department (HOSPITAL_COMMUNITY): Payer: Medicaid Other

## 2020-03-29 ENCOUNTER — Emergency Department (HOSPITAL_COMMUNITY)
Admission: EM | Admit: 2020-03-29 | Discharge: 2020-03-29 | Disposition: A | Discharge status: Home / Self Care | Payer: Medicaid Other | Attending: Emergency Medicine | Admitting: Emergency Medicine

## 2020-03-29 ENCOUNTER — Other Ambulatory Visit: Payer: Self-pay

## 2020-03-29 DIAGNOSIS — E039 Hypothyroidism, unspecified: Secondary | ICD-10-CM | POA: Diagnosis not present

## 2020-03-29 DIAGNOSIS — E162 Hypoglycemia, unspecified: Secondary | ICD-10-CM

## 2020-03-29 DIAGNOSIS — Z79899 Other long term (current) drug therapy: Secondary | ICD-10-CM | POA: Diagnosis not present

## 2020-03-29 DIAGNOSIS — I120 Hypertensive chronic kidney disease with stage 5 chronic kidney disease or end stage renal disease: Secondary | ICD-10-CM | POA: Insufficient documentation

## 2020-03-29 DIAGNOSIS — E1122 Type 2 diabetes mellitus with diabetic chronic kidney disease: Secondary | ICD-10-CM | POA: Diagnosis not present

## 2020-03-29 DIAGNOSIS — N186 End stage renal disease: Secondary | ICD-10-CM | POA: Diagnosis not present

## 2020-03-29 DIAGNOSIS — Z794 Long term (current) use of insulin: Secondary | ICD-10-CM | POA: Insufficient documentation

## 2020-03-29 DIAGNOSIS — M6281 Muscle weakness (generalized): Secondary | ICD-10-CM | POA: Insufficient documentation

## 2020-03-29 DIAGNOSIS — E11649 Type 2 diabetes mellitus with hypoglycemia without coma: Secondary | ICD-10-CM | POA: Diagnosis not present

## 2020-03-29 DIAGNOSIS — R4781 Slurred speech: Secondary | ICD-10-CM | POA: Insufficient documentation

## 2020-03-29 DIAGNOSIS — Z992 Dependence on renal dialysis: Secondary | ICD-10-CM | POA: Insufficient documentation

## 2020-03-29 DIAGNOSIS — L84 Corns and callosities: Secondary | ICD-10-CM | POA: Insufficient documentation

## 2020-03-29 LAB — CBC WITH DIFFERENTIAL/PLATELET
Abs Immature Granulocytes: 0.02 10*3/uL (ref 0.00–0.07)
Basophils Absolute: 0.1 10*3/uL (ref 0.0–0.1)
Basophils Relative: 1 %
Eosinophils Absolute: 0.2 10*3/uL (ref 0.0–0.5)
Eosinophils Relative: 2 %
HCT: 39 % (ref 39.0–52.0)
Hemoglobin: 11.8 g/dL — ABNORMAL LOW (ref 13.0–17.0)
Immature Granulocytes: 0 %
Lymphocytes Relative: 19 %
Lymphs Abs: 2 10*3/uL (ref 0.7–4.0)
MCH: 29.9 pg (ref 26.0–34.0)
MCHC: 30.3 g/dL (ref 30.0–36.0)
MCV: 98.7 fL (ref 80.0–100.0)
Monocytes Absolute: 1.2 10*3/uL — ABNORMAL HIGH (ref 0.1–1.0)
Monocytes Relative: 12 %
Neutro Abs: 6.8 10*3/uL (ref 1.7–7.7)
Neutrophils Relative %: 66 %
Platelets: 206 10*3/uL (ref 150–400)
RBC: 3.95 MIL/uL — ABNORMAL LOW (ref 4.22–5.81)
RDW: 15.1 % (ref 11.5–15.5)
WBC: 10.2 10*3/uL (ref 4.0–10.5)
nRBC: 0 % (ref 0.0–0.2)

## 2020-03-29 LAB — PROTIME-INR
INR: 1.1 (ref 0.8–1.2)
Prothrombin Time: 14.1 seconds (ref 11.4–15.2)

## 2020-03-29 LAB — COMPREHENSIVE METABOLIC PANEL
ALT: 29 U/L (ref 0–44)
AST: 34 U/L (ref 15–41)
Albumin: 4 g/dL (ref 3.5–5.0)
Alkaline Phosphatase: 183 U/L — ABNORMAL HIGH (ref 38–126)
Anion gap: 17 — ABNORMAL HIGH (ref 5–15)
BUN: 85 mg/dL — ABNORMAL HIGH (ref 6–20)
CO2: 24 mmol/L (ref 22–32)
Calcium: 9.7 mg/dL (ref 8.9–10.3)
Chloride: 106 mmol/L (ref 98–111)
Creatinine, Ser: 8.68 mg/dL — ABNORMAL HIGH (ref 0.61–1.24)
GFR, Estimated: 7 mL/min — ABNORMAL LOW (ref >60)
Glucose, Bld: 52 mg/dL — ABNORMAL LOW (ref 70–99)
Potassium: 5.2 mmol/L — ABNORMAL HIGH (ref 3.5–5.1)
Sodium: 147 mmol/L — ABNORMAL HIGH (ref 135–145)
Total Bilirubin: 0.7 mg/dL (ref 0.3–1.2)
Total Protein: 9 g/dL — ABNORMAL HIGH (ref 6.5–8.1)

## 2020-03-29 LAB — CBG MONITORING, ED
Glucose-Capillary: 57 mg/dL — ABNORMAL LOW (ref 70–99)
Glucose-Capillary: 85 mg/dL (ref 70–99)

## 2020-03-29 LAB — APTT: aPTT: 34 seconds (ref 24–36)

## 2020-03-29 MED ORDER — AMLODIPINE BESYLATE 5 MG PO TABS
10.0000 mg | ORAL_TABLET | Freq: Once | ORAL | Status: AC
  Administered 2020-03-29: 10 mg via ORAL
  Filled 2020-03-29: qty 2

## 2020-03-29 MED ORDER — LORAZEPAM 2 MG/ML IJ SOLN
1.0000 mg | Freq: Once | INTRAMUSCULAR | Status: AC
  Administered 2020-03-29: 1 mg via INTRAVENOUS
  Filled 2020-03-29: qty 1

## 2020-03-29 MED ORDER — DEXTROSE 50 % IV SOLN
1.0000 | Freq: Once | INTRAVENOUS | Status: AC
  Administered 2020-03-29: 50 mL via INTRAVENOUS
  Filled 2020-03-29: qty 50

## 2020-03-29 MED ORDER — SODIUM ZIRCONIUM CYCLOSILICATE 10 G PO PACK
10.0000 g | PACK | Freq: Once | ORAL | Status: AC
  Administered 2020-03-29: 10 g via ORAL
  Filled 2020-03-29: qty 1

## 2020-03-29 NOTE — ED Triage Notes (Addendum)
Pt arrived via walk in with caregiver, per caregiver, pt went out to eat last night, vomitted after, chills, "not acting right" since. States she noticed some slurred speech earlier this morning, none at this time. Grip strengths equal. No facial droop. States she has also noticed some twitching throughout the day. Pt wheelchair bound. States he fell this morning at dialysis.

## 2020-03-29 NOTE — ED Notes (Signed)
XR at bedside

## 2020-03-29 NOTE — Discharge Instructions (Addendum)
You came to the emergency department to be evaluated for your slurred speech.  On the emergency department your slurred speech resolved.  The CT scan of your head showed no acute abnormality.  The MRI of your head showed no signs of stroke that could have caused your symptoms.  Your lab work showed that your potassium was slightly elevated and you were given Lokelma to help reduce this until you can receive dialysis tomorrow morning.  Your blood sugar was found to be low and you received intravenous glucose to bring your sugar up.  Get help right away if: You continue to have hypoglycemia symptoms after eating or drinking something that contains 15 grams of fast-acting carbohydrate and you cannot get your blood glucose above 70 mg/dL (3.9 mmol/L) while following the 15:15 rule. Your blood glucose is at or below 54 mg/dL (3 mmol/L). You have a seizure. You faint. You have facial asymmetry You have weakness on one side of your body You develop new slurred speech

## 2020-03-29 NOTE — ED Notes (Signed)
PA notified of patients elevated BP

## 2020-03-29 NOTE — ED Notes (Signed)
Pt being transported to MRI.

## 2020-03-29 NOTE — ED Provider Notes (Signed)
[place] DEPT Provider Note   CSN: [id number] Arrival date & time: 03/29/20  1212     History Chief Complaint  Patient presents with  . Altered Mental Status    [name] is a 50 y.o. male history of ESRD (on dialysis M,W,F), hypertension, hypothyroidism, schizoaffective schizophrenia.  Patient is brought to the department by his caregiver.  He reports that patient has a history of mental retardation however mental status baseline is alert to person place and time.  She reports that patient developed slurred speech last night after returning from dinner at approximately 2100.  Reports that patient woke this morning and his slurred speech was worse than previously.  She also reports that patient was not as talkative as he normally is and taking longer to respond to questions.  She denies any episodes of unresponsiveness, facial symmetry, one sided weakness.  States that patient is wheelchair-bound however is able to stand and pivot with assistance.  Today while transferring to  wheelchair at dialysis center she reports patient had generalized weakness.  Reports that last week he has been dealing with spikes in drops in his blood sugar however this morning blood sugar was found to be 189 at 0700 and 176 at 1045.  Caregiver also reports that patient had some "twitching," in his jaw this morning.  Per caregiver patient had one episode of vomiting last night.  No hematemesis or coffee-ground emesis.  Patient alert to person, place, and time.  Patient denies any fevers, chills, cough, shortness of breath, chest pain, abdominal pain, nausea, diarrhea, blood in stool, Dizziness, facial symmetry, headaches, lightheadedness, numbness, seizures, syncope, weakness.  HPI     Past Medical History:  Diagnosis Date  . Acute respiratory failure with hypoxia ([place])   . Anemia   . Chest pain   . Constipation   . Depression   . Diabetes mellitus   . ESRD (end stage  renal disease) on dialysis [facility])    "[place] off 68; MWF" (02/25/2015)  . HCAP (healthcare-associated pneumonia)   . Hypertension   . Hyperthyroidism   . Hyperthyroidism   . Quadriparesis (muscle weakness)   . Schizo-affective schizophrenia [facility])     Patient Active Problem List   Diagnosis Date Noted  . Acute respiratory failure with hypoxia ([place]) 03/08/2015  . HCAP (healthcare-associated pneumonia) 03/08/2015  . Chest pain 03/08/2015  . Quadriparesis (muscle weakness)   . ESRD (end stage renal disease) on dialysis ([place]) 02/24/2015  . Constipation, acute 02/24/2015  . Normocytic anemia 02/24/2015  . Occult GI bleeding 02/24/2015  . Hypertension 02/24/2015  . Thrombocytopenia ([place]) 02/24/2015  . Hypothermia 02/24/2015  . Elevated lactic acid level 02/24/2015  . Hyperthyroidism 02/24/2015  . Anemia due to GI blood loss 02/24/2015  . Schizophrenia ([place]) 02/24/2015  . Constipation   . Chronic osteomyelitis ([place]) 06/26/2013    Past Surgical History:  Procedure Laterality Date  . DIALYSIS FISTULA CREATION    . MULTIPLE EXTRACTIONS WITH ALVEOLOPLASTY  01/17/2011   Procedure: MULTIPLE EXTRACION WITH ALVEOLOPLASTY;  Surgeon: Gae Bon;  Location: [facility] OR;  Service: Oral Surgery;  Laterality: Bilateral;  Extraction of number one, sixteen, seventeen, and thirty two.        Family History  Family history unknown: Yes    Social History   Tobacco Use  . Smoking status: Never Smoker  Substance Use Topics  . Alcohol use: No  . Drug use: No    Home Medications Prior to Admission medications  Medication Sig Start Date End Date Taking? Authorizing Provider  amLODipine (NORVASC) 10 MG tablet Take 1 tablet (10 mg total) by mouth at bedtime. 02/27/15   Kinnie Feil, MD  bisacodyl (DULCOLAX) 10 MG suppository Place 1 suppository (10 mg total) rectally daily as needed for moderate constipation. Patient not taking: Reported on 01/08/2020 02/27/15   Kinnie Feil, MD  cefTAZidime 2 g in dextrose 5 % 50 mL Inject 2 g into the vein once. LAST dose on HD 03/12/15 03/11/15   Tat, Shanon Brow, MD  cinacalcet (SENSIPAR) 30 MG tablet Take 30 mg by mouth See admin instructions. Tuesday, Thursday & Saturday    [provider]  cloNIDine (CATAPRES) 0.1 MG tablet Take 1 tablet (0.1 mg total) by mouth 3 (three) times daily. 02/27/15   Kinnie Feil, MD  hydrALAZINE (APRESOLINE) 100 MG tablet Take 1 tablet (100 mg total) by mouth 3 (three) times daily. 02/27/15   Kinnie Feil, MD  insulin glargine (LANTUS) 100 UNIT/ML injection Inject 0.12 mLs (12 Units total) into the skin daily. 03/11/15   Orson Eva, MD  insulin lispro (HUMALOG) 100 UNIT/ML injection Inject 5 Units into the skin 3 (three) times daily before meals. Inject additional units for blood sugar levels that are 301-350=6 units, 351-400=8 units.    [provider]  labetalol (NORMODYNE) 100 MG tablet Take 1 tablet (100 mg total) by mouth 2 (two) times daily. 02/27/15   Kinnie Feil, MD  LORazepam (ATIVAN) 1 MG tablet Take 1 mg by mouth 3 (three) times daily. For anxiety     [provider]  multivitamin (RENA-VIT) TABS tablet Take 1 tablet by mouth daily.      [provider]  nitroGLYCERIN (NITROSTAT) 0.4 MG SL tablet Place 0.4 mg under the tongue every 5 (five) minutes as needed. For chest pain  Patient not taking: Reported on 01/08/2020    [provider]  ofloxacin (FLOXIN) 0.3 % OTIC solution Place 3 drops into the left ear 2 (two) times daily. For up to one week. Patient not taking: Reported on 01/23/2019 02/27/18   Vanessa Kick, MD  pantoprazole (PROTONIX) 40 MG tablet Take 40 mg by mouth daily.      [provider]  prednisoLONE acetate (PRED FORTE) 1 % ophthalmic suspension Place 1 drop into the left eye 2 (two) times daily as needed (inflammation).  Patient not taking: Reported on 01/08/2020 10/20/14   [provider]  risperiDONE  (RISPERDAL) 2 MG tablet Take 2 mg by mouth 2 (two) times daily.      [provider]  senna-docusate (SENOKOT-S) 8.6-50 MG tablet Take 1 tablet by mouth 2 (two) times daily. 02/27/15   Kinnie Feil, MD  sevelamer (RENVELA) 800 MG tablet Take 800 mg by mouth 3 (three) times daily.      [provider]  simvastatin (ZOCOR) 20 MG tablet Take 20 mg by mouth at bedtime.      [provider]  temazepam (RESTORIL) 15 MG capsule Take 15 mg by mouth at bedtime.      [provider]  Vancomycin (VANCOCIN) 750 MG/150ML SOLN Inject 150 mLs (750 mg total) into the vein every Monday, Wednesday, and Friday with hemodialysis. LAST dose on HD 03/12/15 03/11/15   TatShanon Brow, MD    Allergies    Lactose intolerance (gi), Polymyxin b, and Sulfamethoxazole  Review of Systems   Review of Systems  Constitutional: Negative for chills and fever.  HENT: Negative for  trouble swallowing.   Eyes: Negative for visual disturbance.  Respiratory: Negative for cough and shortness of breath.   Cardiovascular: Negative for chest pain.  Gastrointestinal: Positive for vomiting. Negative for abdominal distention, abdominal pain, anal bleeding, blood in stool, constipation, diarrhea, nausea and rectal pain.  Genitourinary:       Anuria   Musculoskeletal: Negative for back pain, myalgias, neck pain and neck stiffness.  Skin: Positive for wound. Negative for color change and rash.  Neurological: Positive for speech difficulty. Negative for dizziness, tremors, seizures, syncope, facial asymmetry, weakness, light-headedness, numbness and headaches.  Psychiatric/Behavioral: Negative for confusion.    Physical Exam Updated Vital Signs BP (!) 147/87   Pulse 94   Temp 99 F (37.2 C) (Oral)   Resp 12   SpO2 99%   Physical Exam Vitals and nursing note reviewed.  Constitutional:      General: He is not in acute distress.    Appearance: He is not ill-appearing, toxic-appearing or  diaphoretic.  HENT:     Head: Normocephalic and atraumatic. No raccoon eyes, abrasion, masses or laceration.     Jaw: No trismus or pain on movement.  Eyes:     General: No scleral icterus.       Right eye: No discharge.        Left eye: No discharge.     Extraocular Movements: Extraocular movements intact.     Pupils: Pupils are equal, round, and reactive to light.     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     Pulses:          Dorsalis pedis pulses are 3+ on the right side and 3+ on the left side.     Heart sounds: Normal heart sounds.  Pulmonary:     Effort: Pulmonary effort is normal. No respiratory distress.     Breath sounds: No stridor. No wheezing, rhonchi or rales.  Abdominal:     General: There is no distension. There are no signs of injury.     Palpations: Abdomen is soft. There is no mass or pulsatile mass.     Tenderness: There is no abdominal tenderness. There is no guarding or rebound.  Musculoskeletal:     Cervical back: Normal range of motion and neck supple. No rigidity.     Right lower leg: No edema.     Left lower leg: No edema.     Comments: Fistula to right upper extremity, palpable thrill, no erythema or streaking noted  Feet:     Right foot:     Skin integrity: Callus and dry skin present. No ulcer, blister, skin breakdown, erythema or warmth.     Toenail Condition: Right toenails are abnormally thick.     Left foot:     Skin integrity: Ulcer, callus and dry skin present. No blister, skin breakdown, erythema or warmth.     Toenail Condition: Left toenails are abnormally thick.  Skin:    General: Skin is warm and dry.     Coloration: Skin is not jaundiced or pale.     Findings: No erythema.  Neurological:     General: No focal deficit present.     Mental Status: He is alert and oriented to person, place, and time.     GCS: GCS eye subscore is 4. GCS verbal subscore is 5. GCS motor subscore is 6.      Cranial Nerves: Cranial nerves are intact. No  cranial nerve deficit or facial asymmetry.     Sensory: Sensation is intact.     Motor: Weakness (bilateral lower extremities ) present. No tremor, seizure activity or pronator drift.     Comments: CN III-XII intact; performed in supine position due to patient being wheelchair bound, +5 strength to bilateral upper extremities, equal grip strength, patient able to move both lower extremities minimally however unable to lift and hold legs against gravity  Psychiatric:        Behavior: Behavior is cooperative.     ED Results / Procedures / Treatments   Labs (all labs ordered are listed, but only abnormal results are displayed) Labs Reviewed  COMPREHENSIVE METABOLIC PANEL - Abnormal; Notable for the following components:      Result Value   Sodium 147 (*)    Potassium 5.2 (*)    Glucose, Bld 52 (*)    BUN 85 (*)    Creatinine, Ser 8.68 (*)    Total Protein 9.0 (*)    Alkaline Phosphatase 183 (*)    GFR, Estimated 7 (*)    Anion gap 17 (*)    All other components within normal limits  CBC WITH DIFFERENTIAL/PLATELET - Abnormal; Notable for the following components:   RBC 3.95 (*)    Hemoglobin 11.8 (*)    Monocytes Absolute 1.2 (*)    All other components within normal limits  CBG MONITORING, ED - Abnormal; Notable for the following components:   Glucose-Capillary 57 (*)    All other components within normal limits  PROTIME-INR  APTT  URINALYSIS, ROUTINE W REFLEX MICROSCOPIC  I-STAT CHEM 8, ED  CBG MONITORING, ED    EKG None  Radiology CT Head Wo Contrast  Result Date: 03/29/2020 CLINICAL DATA:  Altered mental status, slurred speech EXAM: CT HEAD WITHOUT CONTRAST TECHNIQUE: Contiguous axial images were obtained from the base of the skull through the vertex without intravenous contrast. COMPARISON:  None. FINDINGS: Brain: No evidence of acute infarction, hemorrhage, hydrocephalus, extra-axial collection or mass lesion/mass effect.  Low-density changes within the periventricular and subcortical white matter bilaterally, greater than expected for patient's age. Vascular: No hyperdense vessel or unexpected calcification. Skull: Normal. Negative for fracture or focal lesion. Sinuses/Orbits: Partial opacification of the left mastoid air cells. The visualized paranasal sinuses are clear. Images of the bilateral globes demonstrate dislocation of the lens on the left (series 6, image 43). Left globe is small relative to the right. There is relative increased attenuation within the posterior chambers of the bilateral globes (series 6, image 19 and 42), possibly reflecting choroid or retinal detachment, or possibly postsurgical. Other: None. IMPRESSION: 1. No acute intracranial findings. 2. Low-density changes within the periventricular and subcortical white matter bilaterally, greater than expected for patient's age. Findings are nonspecific and may represent chronic small vessel ischemic disease, vasculitis, or demyelinating disease. Consider further evaluation with MRI of the brain. 3. Dislocation of the left lens with small left globe, age indeterminate. Relative increased attenuation within the posterior chambers of the bilateral globes may represent choroid or retinal detachment, or possibly postsurgical. Recommend correlation with ophthalmologic history. Follow-up with ophthalmology recommended. 4. Partial opacification of the left mastoid air cells. Electronically Signed   By: Davina Poke D.O.   On: 03/29/2020 14:55   MR Brain Wo Contrast (neuro protocol)  Result Date: 03/29/2020 CLINICAL DATA:  Acute neuro deficit. Mental status change. Slurred speech. Fall. Dialysis patient. EXAM: MRI HEAD WITHOUT CONTRAST TECHNIQUE: Multiplanar, multiecho pulse sequences of the brain and surrounding  structures were obtained without intravenous contrast. COMPARISON:  CT head 03/29/2020 FINDINGS: Brain: Negative for acute infarct.  Negative for  hemorrhage or mass. Generalized atrophy. Extensive periventricular deep white matter hyperintensity bilaterally without restricted diffusion. Brainstem intact. Vascular: Normal arterial flow voids Skull and upper cervical spine: No focal skeletal abnormality. Sinuses/Orbits: Mild mucosal edema right maxillary sinus. Left mastoid effusion Right sub retinal effusion with dislocation of the right lens. Contracted left globe with dislocated and calcified lens and calcified posterior choroid. Other: None IMPRESSION: Negative for acute infarct. Generalized atrophy with moderate white matter changes. White matter changes are most likely due to chronic microvascular ischemia. Correlate with risk factors for small vessel disease. Electronically Signed   By: Franchot Gallo M.D.   On: 03/29/2020 20:22   DG Chest Portable 1 View  Result Date: 03/29/2020 CLINICAL DATA:  50 year old male with history of chills. EXAM: PORTABLE CHEST 1 VIEW COMPARISON:  Chest x-ray 01/22/2019. FINDINGS: Lung volumes are normal. No consolidative airspace disease. No pleural effusions. No pneumothorax. No pulmonary nodule or mass noted. Pulmonary vasculature and the cardiomediastinal silhouette are within normal limits. IMPRESSION: No radiographic evidence of acute cardiopulmonary disease. Electronically Signed   By: Vinnie Langton M.D.   On: 03/29/2020 14:11    Procedures Procedures   Medications Ordered in ED Medications  dextrose 50 % solution 50 mL (50 mLs Intravenous Given 03/29/20 1354)  amLODipine (NORVASC) tablet 10 mg (10 mg Oral Given 03/29/20 1937)  LORazepam (ATIVAN) injection 1 mg (1 mg Intravenous Given 03/29/20 1939)  sodium zirconium cyclosilicate (LOKELMA) packet 10 g (10 g Oral Given 03/29/20 2116)    ED Course  I have reviewed the triage vital signs and the nursing notes.  Pertinent labs & imaging results that were available during my care of the patient were reviewed by me and considered in my medical decision  making (see chart for details).    MDM Rules/Calculators/A&P                          Alert and oriented 50 year old male no acute distress, nontoxic appearing.  Is brought by caregiver who reports that she noticed patient had slurred speech beginning at 2100 yesterday.  Patient's caregiver reports that she last saw him normal approximately 1500 yesterday.  Patient on dialysis last dialysis received Friday.  Per caregiver patient [id number] at 0700 and 176 at 1045 this morning.  Caregiver denies any facial asymmetry, one-sided weakness.  Per caregiver patient is wheelchair-bound.    On physical exam CN III-XII intact; performed in supine position due to patient being wheelchair bound, +5 strength to bilateral upper extremities, equal grip strength, patient able to move both lower extremities minimally however unable to lift and hold legs against gravity.  Patient noted to have slurred speech.    POC CBG, i-STAT Chem-8, CMP, CBC, APTT, INR, noncontrast head CT, EKG, chest x-ray ordered.  Sugar was noted to be 57 on POC CBG.  1 amp D50 ordered.  Will reassess patient after he receives D50.  14:15 reassessed patient after he received 1 amp of D50.  Speech still noted to be slurred.  However patient's caregiver reports that speech is improved.  Noncontrast head CT showed no acute intracranial abnormality.  Will obtain MRI to assess for any acute infarct.  CBC shows RBC and hemoglobin slightly decreased at 3.95 and 11.8 respectively; findings likely secondary to anemia of chronic disease CMP shows sodium and potassium slightly elevated at 147 and  5.2 respectively.  BUN/creatinine elevated at 85 and 8.68, anion gap noted at 17 bicarb within normal limits.  Patient did not receive dialysis today; this is likely the cause of his increased BUN and creatinine.  Anion gap likely due to uremia  On serial reexamination patient and caregiver report resolution of his slurred speech.  Patient is noted to have no  focal neurological deficits.  1900 Caregiver and patient are agitated about wait for MRI.  Requesting to leave.  Care giver contacted legal guardian, [name] with provider in the room.  Unable to reach the legal guardian message was left.  It was explained to caregiver and patient that he is unable to leave AMA unless his legal guardian give permission.    MRI Showed no acute infarct; generalized atrophy with moderate white matter changes.  White matter changes are most likely due to chronic microvascular ischemia.    No sign of infection, hemorrhage or acute infarct as cause for patient's slurred speech.  Hypoglycemia could represent potential cause of patient's alert speech however unlikely due to the fact that patient's sugar was slightly elevated this morning when symptoms were present.  No clear etiology for symptoms at this time.    Patient and caregiver continue to report resolution of slurred speech.  No focal neurological deficits noted.  Patient was given home dose of his Norvasc.  Patient was given dose of Lokelma.  Patient's caregiver reports that patient will receive dialysis tomorrow morning.  Advised patient and his caregiver to follow-up with primary care provider.  Discussed results, findings, treatment and follow up. Patient and caregiver advised of return precautions. Patient and caregiver verbalized understanding and agreed with plan.  Patient was discussed with and evaluated by Dr. Almyra Free.   Final Clinical Impression(s) / ED Diagnoses Final diagnoses:  Slurred speech  Hypoglycemia    Rx / DC Orders ED Discharge Orders    None       Dyann Ruddle 03/30/20 0038    Luna Fuse, MD 04/02/20 1455

## 2020-03-29 NOTE — ED Notes (Signed)
MRI notified and stated will be up to get patient shortly

## 2020-03-30 ENCOUNTER — Ambulatory Visit: Payer: Medicaid Other

## 2020-04-01 ENCOUNTER — Other Ambulatory Visit: Payer: Self-pay

## 2020-04-01 ENCOUNTER — Ambulatory Visit: Payer: Medicaid Other

## 2020-04-01 DIAGNOSIS — R293 Abnormal posture: Secondary | ICD-10-CM

## 2020-04-01 DIAGNOSIS — M6281 Muscle weakness (generalized): Secondary | ICD-10-CM | POA: Diagnosis present

## 2020-04-01 NOTE — Patient Instructions (Signed)
Access Code: [email or web address] URL: [email or web address] Date: 04/01/2020 Prepared by: Cherly Anderson  Exercises Beginner Bridge - 1 x daily - 5 x weekly - 1 sets - 5-10 reps Supine Heel Slide - 1 x daily - 5 x weekly - 1 sets - 5-10 reps Seated Heel Raise - 1 x daily - 5 x weekly - 1 sets - 5-10 reps Seated Long Arc Quad - 1 x daily - 5 x weekly - 1 sets - 10 reps

## 2020-04-02 NOTE — Therapy (Signed)
[facility] [address] [place], [place], [address] Phone: [phone]   Fax:  [phone]  Physical Therapy Treatment  Patient Details  Name: [name] MRN: [id number] Date of Birth: Aug 11, 1970 Referring Provider (PT): Lucianne Lei, MD   Encounter Date: 04/01/2020   PT End of Session - 04/01/20 1530    Visit Number 7    Number of Visits 14    Date for PT Re-Evaluation 03/19/20    Authorization Type Medicaid    Authorization Time Period 3 visits from 01/15/20 through 02/04/20; 3 visist from 02/17/20-03/08/20, 4 visits 2/8-3-7    Authorization - Visit Number 1    Authorization - Number of Visits 4    PT Start Time 1528    PT Stop Time 1620    PT Time Calculation (min) 52 min    Equipment Utilized During Treatment Gait belt;Other (comment)   Steady lift assist   Activity Tolerance Patient tolerated treatment well;No increased pain;Patient limited by fatigue    Behavior During Therapy Flat affect;WFL for tasks assessed/performed           Past Medical History:  Diagnosis Date  . Acute respiratory failure with hypoxia ([place])   . Anemia   . Chest pain   . Constipation   . Depression   . Diabetes mellitus   . ESRD (end stage renal disease) on dialysis [facility])    "[place] off 68; MWF" (02/25/2015)  . HCAP (healthcare-associated pneumonia)   . Hypertension   . Hyperthyroidism   . Hyperthyroidism   . Quadriparesis (muscle weakness)   . Schizo-affective schizophrenia [facility])     Past Surgical History:  Procedure Laterality Date  . DIALYSIS FISTULA CREATION    . MULTIPLE EXTRACTIONS WITH ALVEOLOPLASTY  01/17/2011   Procedure: MULTIPLE EXTRACION WITH ALVEOLOPLASTY;  Surgeon: Gae Bon;  Location: [facility] OR;  Service: Oral Surgery;  Laterality: Bilateral;  Extraction of number one, sixteen, seventeen, and thirty two.     There were no vitals filed for this visit.   Subjective Assessment -  04/01/20 1530    Subjective Pt reports he had a couple complications. Was in ER earlier this week as had altered mental status and some slurred speech. Imaging was negative and nothing else was found.    Patient is accompained by: Family member   caregiver Collie Siad   Pertinent History PMH: ESRD, Type-II DM, HTN, legal blindness, schizophrenia, secondary hyperparathyroidism, and quadriparesis,    Limitations Sitting;Lifting;Standing;Walking;Writing;House hold activities    Patient Stated Goals Pt would like to be able to walk again. Caregiver would like him to be able to help more with activities.    Currently in Pain? No/denies                             [facility] Adult PT Treatment/Exercise - 04/01/20 1533      Bed Mobility   Bed Mobility Supine to Sit;Sit to Supine    Supine to Sit Supervision/Verbal cueing    Sit to Supine Minimal Assistance - Patient > 75%    Sit to Supine - Details (indicate cue type and reason) at legs      Transfers   Transfers Lateral/Scoot Transfers    Lateral/Scoot Transfers 5: Supervision;4: Min guard    Lateral/Scoot Transfer Details (indicate cue type and reason) w/c to/from mat x 2 each direction with keeping left arm rest in place initially going to left as pt  reports he does not flip at home but removed when returning to the right. Pt has more difficulty going to right needing more scoots to complete so removing arm rest provided increased safety. Pt able to perform going to left with more ease clearing bottom to mat each time then scooting to finish turn. Advised removing arm rest at home with return to chair to right for improved safety.      Exercises   Exercises Other Exercises    Other Exercises  PT reviewed HEP from [facility] as noted below.      Knee/Hip Exercises: Aerobic   Other Aerobic SciFit with BUE and BLE level 2 x 6 min for strengthening and ROM. Pt was cued to push through legs.         Exercises Beginner Bridge - 1 x daily -  5 x weekly - 1 sets - 5-10 reps  -pt needed verbal cues for correct form. Only slight lift not able to clear bottom. PT stabilizing at knees. Performed x 10  Supine Heel Slide - 1 x daily - 5 x weekly - 1 sets - 5-10 reps  -performed x 10 each leg with min assist in to flexion and PT resisting in to extension. Seated Heel Raise - 1 x daily - 5 x weekly - 1 sets - 5-10 reps  -pt only able to raise slightly right more than left x 5 Seated Long Arc Quad - 1 x daily - 5 x weekly - 1 sets - 10 reps  -performed x 5 seated only through minimal range with PT raising mat up to not have friction of floor catching shoe.          PT Education - 04/02/20 1110    Education Details Pt was educated on importance of performing HEP at home due to limited therapy visits if wants to see more carryover. Reviewed HEP and caregiver observing throughout so can assist. Instructed to add in supplying resistance in to knee extension with heel slides from caregiver.    Person(s) Educated Patient;Caregiver(s)    Methods Explanation;Demonstration    Comprehension Verbalized understanding;Returned demonstration            PT Short Term Goals - 02/24/20 1046      PT SHORT TERM GOAL #1   Title Pt will be able to complete all bed mobility with CGA for increased independence and decreased caregiver burden.    Baseline Min A with bed mobility and mod A for transfers 01/08/20. 02/04/20 min assist with sit to supine, supervision with rolling and supine to sit.    Status Partially Met      PT SHORT TERM GOAL #2   Title Pt will be able to perform sit to stand transfer with RW from W/C with max A +1.    Baseline Total Assist 01/08/20. 02/04/20 mod assist to rise at [facility] pulling on bar with legs blocked.    Status Not Met      PT SHORT TERM GOAL #3   Title Pt will be able to maintain sitting balance for 5 minutes at EOB with NMR activities.    Baseline <30 seconds 01/08/20. 02/04/20 >5 min supervision at edge of mat.     Status Achieved      PT SHORT TERM GOAL #4   Title Pt will be able to complete squat-pivot transfers with CGA for increased independence and decreased caregiver burden.    Baseline min assist w/c to/from mat    Status Not Met  PT Long Term Goals - 04/02/20 1106      PT LONG TERM GOAL #1   Title Pt will be able to complete all bed mobility with supervision for increased independence and decreased caregiver burden. (updated target dates to 3/1 due to missed visits)    Baseline Min A with bed mobility and mod A for transfers 01/08/20    Time 10    Period Weeks    Status New    Target Date 04/06/20      PT LONG TERM GOAL #2   Title Pt will be able to perform sit to stand transfer with RW from W/C with mod A to help with ADLs.    Baseline Total Assist 01/08/20    Time 10    Period Weeks    Status New    Target Date 04/06/20      PT LONG TERM GOAL #3   Title Pt will be able to maintain sitting balance for >8 minutes at EOB with NMR activities.    Baseline <30 seconds 01/08/20    Time 10    Period Weeks    Status New    Target Date 04/06/20      PT LONG TERM GOAL #4   Title Pt will be able to complete squat-pivot transfers with supervision for increased independence and decreased caregiver burden.    Baseline Mod A    Time 10    Period Weeks    Status New    Target Date 04/06/20                 Plan - 04/02/20 1112    Clinical Impression Statement Pt returns after a month between visits due to missed visits. Having issues with compliance on HEP and PT explained importance of pt performing exercises with caregiver at home due to limited therapy visits. Pt did have good participation today. Limited movement in BLE with exercises with heel slides being the best. PT continued to work on improving safety with lateral scoot transfer w/c to/from mat. Advised to remove armrest on side he is going towards. Easier for pt to transfer to the left.    Comorbidities ESRD,  Type-II DM, HTN, legal blindness, schizophrenia, secondary hyperparathyroidism, and quadriparesis    Examination-Activity Limitations Bed Mobility;Locomotion Level;Transfers;Squat;Stairs;Stand;Toileting    Examination-Participation Restrictions Community Activity;Driving;Interpersonal Relationship;Cleaning;Laundry    Stability/Clinical Decision Making Evolving/Moderate complexity    Rehab Potential Fair    PT Frequency 1x / week   followed by 2x/week for 3 weeks then 1x/week for 4 weeks.   PT Duration 3 weeks    PT Treatment/Interventions ADLs/Self Care Home Management;Gait training;Stair training;Functional mobility training;Therapeutic activities;Therapeutic exercise;Balance training;Neuromuscular re-education;DME Instruction;Cognitive remediation;Patient/family education;Wheelchair mobility training;Manual techniques;Visual/perceptual remediation/compensation;Orthotic Fit/Training    PT Next Visit Plan Check goals and pending progress request more medicaid auth with possible recert versus d/c. Pt has missed multiple visits which has limited progress as well. Continue with functional strengthening with transfers, sitting balance activities, standing at [place], SciFit    PT Home Exercise Plan Access Code: [email or web address]    Consulted and Agree with Plan of Care Other (Comment)    Family Member Consulted Caregiver, Manuela Schwartz           Patient will benefit from skilled therapeutic intervention in order to improve the following deficits and impairments:  Abnormal gait,Decreased range of motion,Difficulty walking,Impaired UE functional use,Decreased activity tolerance,Impaired perceived functional ability,Impaired vision/preception,Decreased balance,Decreased cognition,Decreased strength,Decreased mobility,Impaired sensation  Visit Diagnosis: Muscle weakness (generalized)  Abnormal posture  Problem List Patient Active Problem List   Diagnosis Date Noted  . Acute respiratory failure with  hypoxia ([place]) 03/08/2015  . HCAP (healthcare-associated pneumonia) 03/08/2015  . Chest pain 03/08/2015  . Quadriparesis (muscle weakness)   . ESRD (end stage renal disease) on dialysis ([place]) 02/24/2015  . Constipation, acute 02/24/2015  . Normocytic anemia 02/24/2015  . Occult GI bleeding 02/24/2015  . Hypertension 02/24/2015  . Thrombocytopenia ([place]) 02/24/2015  . Hypothermia 02/24/2015  . Elevated lactic acid level 02/24/2015  . Hyperthyroidism 02/24/2015  . Anemia due to GI blood loss 02/24/2015  . Schizophrenia ([place]) 02/24/2015  . Constipation   . Chronic osteomyelitis ([place]) 06/26/2013    Electa Sniff, PT, DPT, NCS 04/02/2020, 11:16 AM  [facility] [address] [place], [place], [address] Phone: [phone]   Fax:  [phone]  Name: [name] MRN: [id number] Date of Birth: February 21, 1970

## 2020-04-06 ENCOUNTER — Other Ambulatory Visit: Payer: Self-pay

## 2020-04-06 ENCOUNTER — Ambulatory Visit: Payer: Medicaid Other | Attending: Family Medicine

## 2020-04-06 DIAGNOSIS — R2689 Other abnormalities of gait and mobility: Secondary | ICD-10-CM | POA: Insufficient documentation

## 2020-04-06 DIAGNOSIS — M6281 Muscle weakness (generalized): Secondary | ICD-10-CM | POA: Insufficient documentation

## 2020-04-06 DIAGNOSIS — R293 Abnormal posture: Secondary | ICD-10-CM | POA: Diagnosis not present

## 2020-04-06 NOTE — Therapy (Signed)
[facility] [address] [place], [place], [address] Phone: [phone]   Fax:  [phone]  Physical Therapy Treatment  Patient Details  Name: [name] MRN: [id number] Date of Birth: 08-18-1970 Referring Provider (PT): Lucianne Lei, MD   Encounter Date: 04/06/2020   PT End of Session - 04/06/20 1318    Visit Number 8    Number of Visits 17    Date for PT Re-Evaluation --    Authorization Type Medicaid    Authorization Time Period 3 visits from 01/15/20 through 02/04/20; 3 visist from 02/17/20-03/08/20, 4 visits 2/8-3-7    Authorization - Visit Number 2    Authorization - Number of Visits 4    PT Start Time 4917    PT Stop Time 1405    PT Time Calculation (min) 52 min    Equipment Utilized During Treatment Gait belt    Activity Tolerance Patient tolerated treatment well;No increased pain;Patient limited by fatigue    Behavior During Therapy Flat affect;WFL for tasks assessed/performed           Past Medical History:  Diagnosis Date  . Acute respiratory failure with hypoxia ([place])   . Anemia   . Chest pain   . Constipation   . Depression   . Diabetes mellitus   . ESRD (end stage renal disease) on dialysis [facility])    "[facility] off 68; MWF" (02/25/2015)  . HCAP (healthcare-associated pneumonia)   . Hypertension   . Hyperthyroidism   . Hyperthyroidism   . Quadriparesis (muscle weakness)   . Schizo-affective schizophrenia [facility])     Past Surgical History:  Procedure Laterality Date  . DIALYSIS FISTULA CREATION    . MULTIPLE EXTRACTIONS WITH ALVEOLOPLASTY  01/17/2011   Procedure: MULTIPLE EXTRACION WITH ALVEOLOPLASTY;  Surgeon: Gae Bon;  Location: [facility] OR;  Service: Oral Surgery;  Laterality: Bilateral;  Extraction of number one, sixteen, seventeen, and thirty two.     There were no vitals filed for this visit.   Subjective Assessment - 04/06/20 1319    Subjective Pt reports he tried  a couple of the exercises in bed. Did the heel slides and bridges per caregiver.    Patient is accompained by: Family member   caregiver Collie Siad   Pertinent History PMH: ESRD, Type-II DM, HTN, legal blindness, schizophrenia, secondary hyperparathyroidism, and quadriparesis,    Limitations Sitting;Lifting;Standing;Walking;Writing;House hold activities    Patient Stated Goals Pt would like to be able to walk again. Caregiver would like him to be able to help more with activities.    Currently in Pain? No/denies                             [facility] Adult PT Treatment/Exercise - 04/06/20 1320      Transfers   Transfers Sit to Stand;Stand to Sit;Lateral/Scoot Transfers    Sit to Stand 3: Mod assist    Sit to Stand Details Tactile cues for placement;Verbal cues for technique    Sit to Stand Details (indicate cue type and reason) Pt performed pulling up on // bars. PT blocking knees to prevent bucking and verbal cues to keep chest up for more upright posture.    Lateral/Scoot Transfers 5: Supervision;4: Min Holiday representative Details (indicate cue type and reason) w/c to/from mat with arm rest removed. Pt was given verbal cues for hand placement.      Neuro Re-ed  Neuro Re-ed Details  Standing in // bars 3 bouts: first time with only PT and unable to get knees completely straight along with trunk. PT got assistance for 2nd 2 bouts and stood x 1 min and x 30 sec. PT blocking at knees and providing cues to tighten quads squeeze bottom for more erect posture. 2nd therapist at trunk to help get trunk upright but once was straight pt able to hold more with CGA at trunk. Mod assist of PT at legs. Seated on mat >10 min performing NMR activities: coming down on forearm and reaching other arm across body x 5 each side, holding PTs hands pushing her away and then pulling back while maintaining upright posture, maintaining upright posture with manual pertubations x 30 sec. Pt more  challenged with being pushed to the left. Seated reverse crunk against green theraband to work on back extensors more x 10.      Exercises   Exercises Other Exercises      Knee/Hip Exercises: Aerobic   Other Aerobic Sci-Fit level 2.5 with BUE and BLE x 6 min then 2 min with only legs for strengthening, ROM and improving activity tolerance.                  PT Education - 04/06/20 1417    Education Details Instructed pt and caregiver about having to request more auth and new recert plan for 2x/week for 3 weeks and then 1x/week for 3 weeks. Also instructed to perform seated edge of bed with caregiver coming down on forearm and reaching across to each side as well as reverse crunch with green theraband for back extensors.    Person(s) Educated Patient;Caregiver(s)    Methods Explanation;Demonstration    Comprehension Verbalized understanding;Returned demonstration            PT Short Term Goals - 02/24/20 1046      PT SHORT TERM GOAL #1   Title Pt will be able to complete all bed mobility with CGA for increased independence and decreased caregiver burden.    Baseline Min A with bed mobility and mod A for transfers 01/08/20. 02/04/20 min assist with sit to supine, supervision with rolling and supine to sit.    Status Partially Met      PT SHORT TERM GOAL #2   Title Pt will be able to perform sit to stand transfer with RW from W/C with max A +1.    Baseline Total Assist 01/08/20. 02/04/20 mod assist to rise at [facility] pulling on bar with legs blocked.    Status Not Met      PT SHORT TERM GOAL #3   Title Pt will be able to maintain sitting balance for 5 minutes at EOB with NMR activities.    Baseline <30 seconds 01/08/20. 02/04/20 >5 min supervision at edge of mat.    Status Achieved      PT SHORT TERM GOAL #4   Title Pt will be able to complete squat-pivot transfers with CGA for increased independence and decreased caregiver burden.    Baseline min assist w/c to/from mat     Status Not Met             PT Long Term Goals - 04/06/20 1638      PT LONG TERM GOAL #1   Title Pt will be able to complete all bed mobility with supervision for increased independence and decreased caregiver burden. (updated target dates to 3/1 due to missed visits)    Baseline  Min A with bed mobility and mod A for transfers 01/08/20. 04/06/20 supervision with supine to sit, min assist at legs with sit to supine    Time 10    Period Weeks    Status Partially Met      PT LONG TERM GOAL #2   Title Pt will be able to perform sit to stand transfer with RW from W/C with mod A to help with ADLs.    Baseline Total Assist 01/08/20. Performed in // bars mod assist for sit to stand pulling on bars and PT blocking legs 04/06/20    Time 10    Period Weeks    Status Not Met      PT LONG TERM GOAL #3   Title Pt will be able to maintain sitting balance for >8 minutes at EOB with NMR activities.    Baseline <30 seconds 01/08/20. 04/06/20 Pt sat >10 minutes edge of mat performing NMR activities    Time 10    Period Weeks    Status Achieved      PT LONG TERM GOAL #4   Title Pt will be able to complete squat-pivot transfers with supervision for increased independence and decreased caregiver burden.    Baseline 04/06/20 lateral scoot transfers supervision/CGA.    Time 10    Period Weeks    Status Partially Met           Updated PT goals:  PT Short Term Goals - 04/06/20 1648      PT SHORT TERM GOAL #1   Title STGs=LTGs           PT Long Term Goals - 04/06/20 1648      PT LONG TERM GOAL #1   Title Pt will be able to complete all bed mobility with supervision for increased independence and decreased caregiver burden. (updated target dates to 3/1 due to missed visits)    Baseline 04/06/20 supervision with supine to sit, min assist at legs with sit to supine    Time 6    Period Weeks    Status On-going    Target Date 05/18/20      PT LONG TERM GOAL #2   Title Pt will be able to perform sit to  stand transfer in // bars or at sink min assist for improved strength.    Baseline Performed in // bars mod assist for sit to stand pulling on bars and PT blocking legs 04/06/20    Time 6    Period Weeks    Status Revised    Target Date 05/18/20      PT LONG TERM GOAL #3   Title Pt will be able to maintain standing in // bars x 3 min min assist for improved standing balance to assist with ADLs.    Baseline 04/06/20 1 min mod assist of 2 people.    Time 6    Period Weeks    Status New    Target Date 05/18/20      PT LONG TERM GOAL #4   Title Pt will be able to complete squat-pivot/lateral scoot transfers with supervision for increased independence and decreased caregiver burden.    Baseline 04/06/20 lateral scoot transfers supervision/CGA.    Time 6    Period Weeks    Status Revised    Target Date 05/18/20      PT LONG TERM GOAL #5   Title Pt will be able to ambulate 8' in // bars mod assist.  Baseline nonambulatory    Time 6    Period Weeks    Status New    Target Date 05/18/20                Plan - 04/06/20 1640    Clinical Impression Statement PT reassessed LTGs today. Pt is making gradual progress. Has been limited due to missed visits but starting to arrive more consistently with afternoon appointments. Pt needs verbal cuing and guidance with tactile assist due to being blind. Have been working on lateral scoot transfers with removing arm rest which he is now able to perform supervision/CGA. Pt is supervision with supine to sit and min assist with sit to supine to help legs up at times. Pt just started to work on standing in // bars with PT blocking legs and assist at trunk mod assist with longest bout of 1 min. Pt is trying to be more consistent with HEP with caregiver assist. Pt will benefit from continued PT to continue to work on functional strengthening, transfers, standing to maximize function.    Comorbidities ESRD, Type-II DM, HTN, legal blindness, schizophrenia,  secondary hyperparathyroidism, and quadriparesis    Examination-Activity Limitations Bed Mobility;Locomotion Level;Transfers;Squat;Stairs;Stand;Toileting    Examination-Participation Restrictions Community Activity;Driving;Interpersonal Relationship;Cleaning;Laundry    Stability/Clinical Decision Making Evolving/Moderate complexity    Rehab Potential Fair    PT Frequency 2x / week   followed by 1x/week for 3 weeks   PT Duration 3 weeks    PT Treatment/Interventions ADLs/Self Care Home Management;Gait training;Stair training;Functional mobility training;Therapeutic activities;Therapeutic exercise;Balance training;Neuromuscular re-education;DME Instruction;Cognitive remediation;Patient/family education;Wheelchair mobility training;Manual techniques;Visual/perceptual remediation/compensation;Orthotic Fit/Training    PT Next Visit Plan Warm up on SciFit x 8 min. Continue with functional strengthening with transfers, sitting balance activities to engage core more as well as back extensors. Standing in // bars. May need second person to help get trunk upright to start.    PT Home Exercise Plan Access Code: [email or web address]    Consulted and Agree with Plan of Care Other (Comment)    Family Member Consulted Caregiver, Manuela Schwartz           Patient will benefit from skilled therapeutic intervention in order to improve the following deficits and impairments:  Abnormal gait,Decreased range of motion,Difficulty walking,Impaired UE functional use,Decreased activity tolerance,Impaired perceived functional ability,Impaired vision/preception,Decreased balance,Decreased cognition,Decreased strength,Decreased mobility,Impaired sensation  Visit Diagnosis: Abnormal posture  Muscle weakness (generalized)  Other abnormalities of gait and mobility     Problem List Patient Active Problem List   Diagnosis Date Noted  . Acute respiratory failure with hypoxia ([place]) 03/08/2015  . HCAP (healthcare-associated  pneumonia) 03/08/2015  . Chest pain 03/08/2015  . Quadriparesis (muscle weakness)   . ESRD (end stage renal disease) on dialysis ([place]) 02/24/2015  . Constipation, acute 02/24/2015  . Normocytic anemia 02/24/2015  . Occult GI bleeding 02/24/2015  . Hypertension 02/24/2015  . Thrombocytopenia ([place]) 02/24/2015  . Hypothermia 02/24/2015  . Elevated lactic acid level 02/24/2015  . Hyperthyroidism 02/24/2015  . Anemia due to GI blood loss 02/24/2015  . Schizophrenia ([place]) 02/24/2015  . Constipation   . Chronic osteomyelitis ([place]) 06/26/2013    Electa Sniff, PT, DPT, NCS 04/06/2020, 4:47 PM  [facility] [address] [place], [place], [address] Phone: [phone]   Fax:  [phone]  Name: CORVIN SORBO MRN: [id number] Date of Birth: 1970-06-06

## 2020-04-20 ENCOUNTER — Other Ambulatory Visit: Payer: Self-pay

## 2020-04-20 ENCOUNTER — Ambulatory Visit: Payer: Medicaid Other

## 2020-04-20 DIAGNOSIS — R293 Abnormal posture: Secondary | ICD-10-CM

## 2020-04-20 DIAGNOSIS — M6281 Muscle weakness (generalized): Secondary | ICD-10-CM

## 2020-04-20 NOTE — Therapy (Signed)
[facility] [address] [place], [place], [address] Phone: [phone]   Fax:  [phone]  Physical Therapy Treatment  Patient Details  Name: [name] MRN: [id number] Date of Birth: Jul 28, 1970 Referring Provider (PT): Lucianne Lei, MD   Encounter Date: 04/20/2020   PT End of Session - 04/20/20 1434    Visit Number 9    Number of Visits 17    Authorization Type Medicaid    Authorization Time Period 3 visits from 01/15/20 through 02/04/20; 3 visist from 02/17/20-03/08/20, 4 visits 2/8-3-7, 9 visits 3/15-4/25    Authorization - Visit Number 1    Authorization - Number of Visits 9    PT Start Time [id number]    PT Stop Time 1446    PT Time Calculation (min) 43 min    Equipment Utilized During Treatment Gait belt    Activity Tolerance Patient tolerated treatment well;No increased pain;Patient limited by fatigue    Behavior During Therapy Flat affect;WFL for tasks assessed/performed           Past Medical History:  Diagnosis Date  . Acute respiratory failure with hypoxia ([place])   . Anemia   . Chest pain   . Constipation   . Depression   . Diabetes mellitus   . ESRD (end stage renal disease) on dialysis [facility])    "[place] off 68; MWF" (02/25/2015)  . HCAP (healthcare-associated pneumonia)   . Hypertension   . Hyperthyroidism   . Hyperthyroidism   . Quadriparesis (muscle weakness)   . Schizo-affective schizophrenia [facility])     Past Surgical History:  Procedure Laterality Date  . DIALYSIS FISTULA CREATION    . MULTIPLE EXTRACTIONS WITH ALVEOLOPLASTY  01/17/2011   Procedure: MULTIPLE EXTRACION WITH ALVEOLOPLASTY;  Surgeon: Gae Bon;  Location: [facility] OR;  Service: Oral Surgery;  Laterality: Bilateral;  Extraction of number one, sixteen, seventeen, and thirty two.     There were no vitals filed for this visit.   Subjective Assessment - 04/20/20 1435    Subjective Pt reports he is excited to try  the bike and standing again. Has been working on some of the arm exercises. Still transferring with arm rest in place at home.    Patient is accompained by: Family member   caregiver Collie Siad   Pertinent History PMH: ESRD, Type-II DM, HTN, legal blindness, schizophrenia, secondary hyperparathyroidism, and quadriparesis,    Limitations Sitting;Lifting;Standing;Walking;Writing;House hold activities    Patient Stated Goals Pt would like to be able to walk again. Caregiver would like him to be able to help more with activities.    Currently in Pain? No/denies                             [facility] Adult PT Treatment/Exercise - 04/20/20 1436      Transfers   Transfers Sit to Stand;Stand to Sit    Sit to Stand 3: Mod assist   +2   Sit to Stand Details Verbal cues for technique;Tactile cues for placement    Sit to Stand Details (indicate cue type and reason) Pt performed pulling up on // bars. PT blocking knees to prevent bucking and verbal cues to keep chest up for more upright posture. Rehab tech assisting to get trunk upright min assist      Neuro Re-ed    Neuro Re-ed Details  Standing in // bars x 1 min 50 sec then 2 min. PT blocking  knees and providing tactile cues at bottom to bring hips forward. Pt was given verbal cues to keep chest up and try to straighten knees and tighten bottom. Rehab tech CGA at chest to try to help maintain upright posture.      Exercises   Exercises Other Exercises    Other Exercises  Seated in w/c, reverse crunch coming foreward and then PT resisting in to extension x 10. Shoulder depression in to arm rests x 10 with verbal cues for technique.      Knee/Hip Exercises: Aerobic   Other Aerobic Sci-Fit level 2.5 with BUE and BLE x 65mn for strengthening, ROM and improving activity tolerance. PT assisted to stabilize w/c and provided tactile cues to try to keep right leg in more neutral position for some time.                    PT Short Term Goals  - 04/06/20 1648      PT SHORT TERM GOAL #1   Title STGs=LTGs             PT Long Term Goals - 04/06/20 1648      PT LONG TERM GOAL #1   Title Pt will be able to complete all bed mobility with supervision for increased independence and decreased caregiver burden. (updated target dates to 3/1 due to missed visits)    Baseline 04/06/20 supervision with supine to sit, min assist at legs with sit to supine    Time 6    Period Weeks    Status On-going    Target Date 05/18/20      PT LONG TERM GOAL #2   Title Pt will be able to perform sit to stand transfer in // bars or at sink min assist for improved strength.    Baseline Performed in // bars mod assist for sit to stand pulling on bars and PT blocking legs 04/06/20    Time 6    Period Weeks    Status Revised    Target Date 05/18/20      PT LONG TERM GOAL #3   Title Pt will be able to maintain standing in // bars x 3 min min assist for improved standing balance to assist with ADLs.    Baseline 04/06/20 1 min mod assist of 2 people.    Time 6    Period Weeks    Status New    Target Date 05/18/20      PT LONG TERM GOAL #4   Title Pt will be able to complete squat-pivot/lateral scoot transfers with supervision for increased independence and decreased caregiver burden.    Baseline 04/06/20 lateral scoot transfers supervision/CGA.    Time 6    Period Weeks    Status Revised    Target Date 05/18/20      PT LONG TERM GOAL #5   Title Pt will be able to ambulate 8' in // bars mod assist.    Baseline nonambulatory    Time 6    Period Weeks    Status New    Target Date 05/18/20                 Plan - 04/20/20 1853    Clinical Impression Statement Pt was able to get more upright posture in standing quicker today with less assist. Increased standing time.    Comorbidities ESRD, Type-II DM, HTN, legal blindness, schizophrenia, secondary hyperparathyroidism, and quadriparesis    Examination-Activity Limitations Bed  Mobility;Locomotion  Level;Transfers;Squat;Stairs;Stand;Toileting    Examination-Participation Restrictions Community Activity;Driving;Interpersonal Relationship;Cleaning;Laundry    Stability/Clinical Decision Making Evolving/Moderate complexity    Rehab Potential Fair    PT Frequency 2x / week   followed by 1x/week for 3 weeks   PT Duration 3 weeks    PT Treatment/Interventions ADLs/Self Care Home Management;Gait training;Stair training;Functional mobility training;Therapeutic activities;Therapeutic exercise;Balance training;Neuromuscular re-education;DME Instruction;Cognitive remediation;Patient/family education;Wheelchair mobility training;Manual techniques;Visual/perceptual remediation/compensation;Orthotic Fit/Training    PT Next Visit Plan Warm up on SciFit x 8 min. Continue with functional strengthening with transfers, sitting balance activities to engage core more as well as back extensors. Standing in // bars. May need second person to help get trunk upright to start.    PT Home Exercise Plan Access Code: [email or web address]    Consulted and Agree with Plan of Care Other (Comment)    Family Member Consulted Caregiver, Manuela Schwartz           Patient will benefit from skilled therapeutic intervention in order to improve the following deficits and impairments:  Abnormal gait,Decreased range of motion,Difficulty walking,Impaired UE functional use,Decreased activity tolerance,Impaired perceived functional ability,Impaired vision/preception,Decreased balance,Decreased cognition,Decreased strength,Decreased mobility,Impaired sensation  Visit Diagnosis: Muscle weakness (generalized)  Abnormal posture     Problem List Patient Active Problem List   Diagnosis Date Noted  . Acute respiratory failure with hypoxia ([place]) 03/08/2015  . HCAP (healthcare-associated pneumonia) 03/08/2015  . Chest pain 03/08/2015  . Quadriparesis (muscle weakness)   . ESRD (end stage renal disease) on dialysis ([place])  02/24/2015  . Constipation, acute 02/24/2015  . Normocytic anemia 02/24/2015  . Occult GI bleeding 02/24/2015  . Hypertension 02/24/2015  . Thrombocytopenia ([place]) 02/24/2015  . Hypothermia 02/24/2015  . Elevated lactic acid level 02/24/2015  . Hyperthyroidism 02/24/2015  . Anemia due to GI blood loss 02/24/2015  . Schizophrenia ([place]) 02/24/2015  . Constipation   . Chronic osteomyelitis ([place]) 06/26/2013    Electa Sniff, PT, DPT, NCS 04/20/2020, 6:55 PM  [facility] [address] [place], [place], [address] Phone: [phone]   Fax:  [phone]  Name: [name] MRN: [id number] Date of Birth: Jun 23, 1970

## 2020-04-22 ENCOUNTER — Ambulatory Visit: Payer: Medicaid Other

## 2020-04-27 ENCOUNTER — Ambulatory Visit: Payer: Medicaid Other | Admitting: Physical Therapy

## 2020-04-27 ENCOUNTER — Other Ambulatory Visit: Payer: Self-pay

## 2020-04-27 ENCOUNTER — Encounter: Payer: Self-pay | Admitting: Physical Therapy

## 2020-04-27 DIAGNOSIS — R293 Abnormal posture: Secondary | ICD-10-CM

## 2020-04-27 DIAGNOSIS — M6281 Muscle weakness (generalized): Secondary | ICD-10-CM

## 2020-04-27 DIAGNOSIS — R2689 Other abnormalities of gait and mobility: Secondary | ICD-10-CM

## 2020-04-28 NOTE — Therapy (Signed)
[facility] [address] [place], [place], [address] Phone: [phone]   Fax:  [phone]  Physical Therapy Treatment  Patient Details  Name: [name] MRN: [id number] Date of Birth: 05/18/1970 Referring Provider (PT): Lucianne Lei, MD   Encounter Date: 04/27/2020   PT End of Session - 04/27/20 1409    Visit Number 10    Number of Visits 17    Authorization Type Medicaid    Authorization Time Period 3 visits from 01/15/20 through 02/04/20; 3 visist from 02/17/20-03/08/20, 4 visits 2/8-3-7, 9 visits 3/15-4/25    Authorization - Visit Number 2    Authorization - Number of Visits 9    PT Start Time [id number]    PT Stop Time [id number]    PT Time Calculation (min) 40 min    Equipment Utilized During Treatment Gait belt    Activity Tolerance Patient tolerated treatment well;No increased pain;Patient limited by fatigue    Behavior During Therapy Flat affect;WFL for tasks assessed/performed           Past Medical History:  Diagnosis Date  . Acute respiratory failure with hypoxia ([place])   . Anemia   . Chest pain   . Constipation   . Depression   . Diabetes mellitus   . ESRD (end stage renal disease) on dialysis [facility])    "[place] off 68; MWF" (02/25/2015)  . HCAP (healthcare-associated pneumonia)   . Hypertension   . Hyperthyroidism   . Hyperthyroidism   . Quadriparesis (muscle weakness)   . Schizo-affective schizophrenia [facility])     Past Surgical History:  Procedure Laterality Date  . DIALYSIS FISTULA CREATION    . MULTIPLE EXTRACTIONS WITH ALVEOLOPLASTY  01/17/2011   Procedure: MULTIPLE EXTRACION WITH ALVEOLOPLASTY;  Surgeon: Gae Bon;  Location: [facility] OR;  Service: Oral Surgery;  Laterality: Bilateral;  Extraction of number one, sixteen, seventeen, and thirty two.     There were no vitals filed for this visit.   Subjective Assessment - 04/27/20 1408    Subjective Pt asking to work on the bike  and on standing today. No falls or pain to report.    Patient is accompained by: Family member   caregiver Collie Siad   Pertinent History PMH: ESRD, Type-II DM, HTN, legal blindness, schizophrenia, secondary hyperparathyroidism, and quadriparesis,    Limitations Sitting;Lifting;Standing;Walking;Writing;House hold activities    Patient Stated Goals Pt would like to be able to walk again. Caregiver would like him to be able to help more with activities.    Currently in Pain? No/denies                   [facility] Adult PT Treatment/Exercise - 04/27/20 1410      Transfers   Transfers Sit to Stand;Stand to Sit    Sit to Stand 3: Mod assist    Sit to Stand Details Verbal cues for technique;Tactile cues for placement    Stand to Sit 3: Mod assist;With upper extremity assist;To bed;To chair/3-in-1;Uncontrolled descent    Stand to Sit Details (indicate cue type and reason) Tactile cues for initiation;Tactile cues for weight shifting;Verbal cues for sequencing;Verbal cues for technique;Manual facilitation for weight shifting;Manual facilitation for weight bearing    Lateral/Scoot Transfers 5: Supervision;4: Min guard    Lateral/Scoot Transfer Details (indicate cue type and reason) verbal cues only needed for hand placement with transfers and for weight shifitng.    Comments stood for ~2 minutes in paralllel bars with mod>min assist with pad  at pelvis, knees blocked and tech guarding trunk. cues for upright posture and hip/knee extension with standing. Heavy UE reliance on bars with standing.      Exercises   Exercises Other Exercises    Other Exercises  for strengthening at edge of mat: green band resisted reverse cruches with inverted chair behind pt and resisted being applied as pt leans back. cues for slow/controlled return to sitting as well for 10 reps. Then with green band- shoulder horizontal abdcution x 10 reps with cues/assist to keep UE's just below shoulder level.      Knee/Hip Exercises:  Aerobic   Other Aerobic Sci-Fit level 2.5 with BUE and BLE x 15mn for strengthening, ROM and improving activity tolerance. PT assisted to stabilize w/c and provided tactile cues to try to keep right leg in more neutral position for some time.                    PT Short Term Goals - 04/06/20 1648      PT SHORT TERM GOAL #1   Title STGs=LTGs             PT Long Term Goals - 04/06/20 1648      PT LONG TERM GOAL #1   Title Pt will be able to complete all bed mobility with supervision for increased independence and decreased caregiver burden. (updated target dates to 3/1 due to missed visits)    Baseline 04/06/20 supervision with supine to sit, min assist at legs with sit to supine    Time 6    Period Weeks    Status On-going    Target Date 05/18/20      PT LONG TERM GOAL #2   Title Pt will be able to perform sit to stand transfer in // bars or at sink min assist for improved strength.    Baseline Performed in // bars mod assist for sit to stand pulling on bars and PT blocking legs 04/06/20    Time 6    Period Weeks    Status Revised    Target Date 05/18/20      PT LONG TERM GOAL #3   Title Pt will be able to maintain standing in // bars x 3 min min assist for improved standing balance to assist with ADLs.    Baseline 04/06/20 1 min mod assist of 2 people.    Time 6    Period Weeks    Status New    Target Date 05/18/20      PT LONG TERM GOAL #4   Title Pt will be able to complete squat-pivot/lateral scoot transfers with supervision for increased independence and decreased caregiver burden.    Baseline 04/06/20 lateral scoot transfers supervision/CGA.    Time 6    Period Weeks    Status Revised    Target Date 05/18/20      PT LONG TERM GOAL #5   Title Pt will be able to ambulate 8' in // bars mod assist.    Baseline nonambulatory    Time 6    Period Weeks    Status New    Target Date 05/18/20                 Plan - 04/27/20 1409    Clinical Impression  Statement Today's skilled session continued to focus on lateral transfers, strengthening and standing with assist/cues needed on technique throughout. No issues noted or reported in session. The pt is making progress and  should benefit from continued PT to progress toward unmet goals.    Comorbidities ESRD, Type-II DM, HTN, legal blindness, schizophrenia, secondary hyperparathyroidism, and quadriparesis    Examination-Activity Limitations Bed Mobility;Locomotion Level;Transfers;Squat;Stairs;Stand;Toileting    Examination-Participation Restrictions Community Activity;Driving;Interpersonal Relationship;Cleaning;Laundry    Stability/Clinical Decision Making Evolving/Moderate complexity    Rehab Potential Fair    PT Frequency 2x / week   followed by 1x/week for 3 weeks   PT Duration 3 weeks    PT Treatment/Interventions ADLs/Self Care Home Management;Gait training;Stair training;Functional mobility training;Therapeutic activities;Therapeutic exercise;Balance training;Neuromuscular re-education;DME Instruction;Cognitive remediation;Patient/family education;Wheelchair mobility training;Manual techniques;Visual/perceptual remediation/compensation;Orthotic Fit/Training    PT Next Visit Plan Warm up on SciFit x 8 min. Continue with functional strengthening with transfers, sitting balance activities to engage core more as well as back extensors. Standing in // bars. May need second person to help get trunk upright to start.    PT Home Exercise Plan Access Code: [email or web address]    Consulted and Agree with Plan of Care Other (Comment)    Family Member Consulted Caregiver, Manuela Schwartz           Patient will benefit from skilled therapeutic intervention in order to improve the following deficits and impairments:  Abnormal gait,Decreased range of motion,Difficulty walking,Impaired UE functional use,Decreased activity tolerance,Impaired perceived functional ability,Impaired vision/preception,Decreased  balance,Decreased cognition,Decreased strength,Decreased mobility,Impaired sensation  Visit Diagnosis: Muscle weakness (generalized)  Abnormal posture  Other abnormalities of gait and mobility     Problem List Patient Active Problem List   Diagnosis Date Noted  . Acute respiratory failure with hypoxia ([place]) 03/08/2015  . HCAP (healthcare-associated pneumonia) 03/08/2015  . Chest pain 03/08/2015  . Quadriparesis (muscle weakness)   . ESRD (end stage renal disease) on dialysis ([place]) 02/24/2015  . Constipation, acute 02/24/2015  . Normocytic anemia 02/24/2015  . Occult GI bleeding 02/24/2015  . Hypertension 02/24/2015  . Thrombocytopenia ([place]) 02/24/2015  . Hypothermia 02/24/2015  . Elevated lactic acid level 02/24/2015  . Hyperthyroidism 02/24/2015  . Anemia due to GI blood loss 02/24/2015  . Schizophrenia ([place]) 02/24/2015  . Constipation   . Chronic osteomyelitis ([place]) 06/26/2013    Willow Ora, PTA, [facility] [address], [place], [place] [address] [phone] 04/28/20, 11:50 AM   Name: [name] MRN: [id number] Date of Birth: [birth year]

## 2020-04-29 ENCOUNTER — Other Ambulatory Visit: Payer: Self-pay

## 2020-04-29 ENCOUNTER — Ambulatory Visit: Payer: Medicaid Other

## 2020-04-29 DIAGNOSIS — M6281 Muscle weakness (generalized): Secondary | ICD-10-CM

## 2020-04-29 DIAGNOSIS — R293 Abnormal posture: Secondary | ICD-10-CM

## 2020-04-29 NOTE — Therapy (Signed)
[facility] [address] [place], [place], [address] Phone: [phone]   Fax:  [phone]  Physical Therapy Treatment  Patient Details  Name: [name] MRN: [id number] Date of Birth: 1970/04/15 Referring Provider (PT): Lucianne Lei, MD   Encounter Date: 04/29/2020   PT End of Session - 04/29/20 1017    Visit Number 11    Number of Visits 17    Authorization Type Medicaid    Authorization Time Period 3 visits from 01/15/20 through 02/04/20; 3 visist from 02/17/20-03/08/20, 4 visits 2/8-3-7, 9 visits 3/15-4/25    Authorization - Visit Number 3    Authorization - Number of Visits 9    PT Start Time [id number]    PT Stop Time 1059    PT Time Calculation (min) 44 min    Equipment Utilized During Treatment Gait belt    Activity Tolerance Patient tolerated treatment well;No increased pain;Patient limited by fatigue    Behavior During Therapy Flat affect;WFL for tasks assessed/performed           Past Medical History:  Diagnosis Date  . Acute respiratory failure with hypoxia ([place])   . Anemia   . Chest pain   . Constipation   . Depression   . Diabetes mellitus   . ESRD (end stage renal disease) on dialysis [facility])    "[place] off 68; MWF" (02/25/2015)  . HCAP (healthcare-associated pneumonia)   . Hypertension   . Hyperthyroidism   . Hyperthyroidism   . Quadriparesis (muscle weakness)   . Schizo-affective schizophrenia [facility])     Past Surgical History:  Procedure Laterality Date  . DIALYSIS FISTULA CREATION    . MULTIPLE EXTRACTIONS WITH ALVEOLOPLASTY  01/17/2011   Procedure: MULTIPLE EXTRACION WITH ALVEOLOPLASTY;  Surgeon: Gae Bon;  Location: [facility] OR;  Service: Oral Surgery;  Laterality: Bilateral;  Extraction of number one, sixteen, seventeen, and thirty two.     There were no vitals filed for this visit.   Subjective Assessment - 04/29/20 1017    Subjective Pt denies any new issues.     Patient is accompained by: Family member   caregiver Collie Siad   Pertinent History PMH: ESRD, Type-II DM, HTN, legal blindness, schizophrenia, secondary hyperparathyroidism, and quadriparesis,    Limitations Sitting;Lifting;Standing;Walking;Writing;House hold activities    Patient Stated Goals Pt would like to be able to walk again. Caregiver would like him to be able to help more with activities.    Currently in Pain? No/denies                             [facility] Adult PT Treatment/Exercise - 04/29/20 1017      Transfers   Transfers Lateral/Scoot Transfers;Stand to Sit    Sit to Stand 4: Min assist;3: Mod assist    Sit to Stand Details Verbal cues for technique    Sit to Stand Details (indicate cue type and reason) Pt performed sit to stand from raised mat at [place] today. PT assisted to guide hands to bar and then cued pt to lean forward and tighten gluts to help with rising. Tactile cues at chest for more upright posture.    Stand to Sit 4: Min assist    Stand to Sit Details Holding to [place] to lower.    Lateral/Scoot Transfers 5: Supervision    Lateral/Scoot Transfer Details (indicate cue type and reason) verbal cues for hand placement after PT assisted to flip up  arm rest. Pt needed a couple scoots to get across. Pt was cued to keep weight forward.      Neuro Re-ed    Neuro Re-ed Details  Pt stood in [place] x 4 trials with 50 sec, 30 sec, 2 sec and 30 sec      Exercises   Exercises Other Exercises    Other Exercises  Seated edge of mat: reverse crunch x 10 against green theraband then pushing down through arms to lift bottom x 10 with verbal cues to keep weight forward      Knee/Hip Exercises: Aerobic   Other Aerobic Sci-Fit level 2.5 with BUE and BLE x 71mn for strengthening, ROM and improving activity tolerance. PT assisted to stabilize w/c and provided tactile cues to try to keep right leg in more neutral position for some time.                  PT Education -  04/29/20 1111    Education Details Pt to continue with current HEP    Person(s) Educated Patient;Caregiver(s)    Methods Explanation    Comprehension Verbalized understanding            PT Short Term Goals - 04/06/20 1648      PT SHORT TERM GOAL #1   Title STGs=LTGs             PT Long Term Goals - 04/06/20 1648      PT LONG TERM GOAL #1   Title Pt will be able to complete all bed mobility with supervision for increased independence and decreased caregiver burden. (updated target dates to 3/1 due to missed visits)    Baseline 04/06/20 supervision with supine to sit, min assist at legs with sit to supine    Time 6    Period Weeks    Status On-going    Target Date 05/18/20      PT LONG TERM GOAL #2   Title Pt will be able to perform sit to stand transfer in // bars or at sink min assist for improved strength.    Baseline Performed in // bars mod assist for sit to stand pulling on bars and PT blocking legs 04/06/20    Time 6    Period Weeks    Status Revised    Target Date 05/18/20      PT LONG TERM GOAL #3   Title Pt will be able to maintain standing in // bars x 3 min min assist for improved standing balance to assist with ADLs.    Baseline 04/06/20 1 min mod assist of 2 people.    Time 6    Period Weeks    Status New    Target Date 05/18/20      PT LONG TERM GOAL #4   Title Pt will be able to complete squat-pivot/lateral scoot transfers with supervision for increased independence and decreased caregiver burden.    Baseline 04/06/20 lateral scoot transfers supervision/CGA.    Time 6    Period Weeks    Status Revised    Target Date 05/18/20      PT LONG TERM GOAL #5   Title Pt will be able to ambulate 8' in // bars mod assist.    Baseline nonambulatory    Time 6    Period Weeks    Status New    Target Date 05/18/20                 Plan -  04/29/20 1112    Clinical Impression Statement PT continued to work more on transfers. Pt able to perform with lateral  scoot transfer with armrest removed supervision with verbal cuing for hand placement. Trialed standing in [place] again today versus bars and was more challenging for pt to keep upright posture and was not able to maintain as long as could not rely on arms as much. Did get straighter than prior attempts on [place]. Pt continues to feel good with movement on SciFit.    Comorbidities ESRD, Type-II DM, HTN, legal blindness, schizophrenia, secondary hyperparathyroidism, and quadriparesis    Examination-Activity Limitations Bed Mobility;Locomotion Level;Transfers;Squat;Stairs;Stand;Toileting    Examination-Participation Restrictions Community Activity;Driving;Interpersonal Relationship;Cleaning;Laundry    Stability/Clinical Decision Making Evolving/Moderate complexity    Rehab Potential Fair    PT Frequency 2x / week   followed by 1x/week for 3 weeks   PT Duration 3 weeks    PT Treatment/Interventions ADLs/Self Care Home Management;Gait training;Stair training;Functional mobility training;Therapeutic activities;Therapeutic exercise;Balance training;Neuromuscular re-education;DME Instruction;Cognitive remediation;Patient/family education;Wheelchair mobility training;Manual techniques;Visual/perceptual remediation/compensation;Orthotic Fit/Training    PT Next Visit Plan Warm up on SciFit x 8 min. Continue with functional strengthening with transfers, sitting balance activities to engage core more as well as back extensors. Standing in // bars. May need second person to help get trunk upright to start with PT blocking legs.    PT Home Exercise Plan Access Code: [email or web address]    Consulted and Agree with Plan of Care Other (Comment)    Family Member Consulted Caregiver, Manuela Schwartz           Patient will benefit from skilled therapeutic intervention in order to improve the following deficits and impairments:  Abnormal gait,Decreased range of motion,Difficulty walking,Impaired UE functional use,Decreased activity  tolerance,Impaired perceived functional ability,Impaired vision/preception,Decreased balance,Decreased cognition,Decreased strength,Decreased mobility,Impaired sensation  Visit Diagnosis: Abnormal posture  Muscle weakness (generalized)     Problem List Patient Active Problem List   Diagnosis Date Noted  . Acute respiratory failure with hypoxia ([place]) 03/08/2015  . HCAP (healthcare-associated pneumonia) 03/08/2015  . Chest pain 03/08/2015  . Quadriparesis (muscle weakness)   . ESRD (end stage renal disease) on dialysis ([place]) 02/24/2015  . Constipation, acute 02/24/2015  . Normocytic anemia 02/24/2015  . Occult GI bleeding 02/24/2015  . Hypertension 02/24/2015  . Thrombocytopenia ([place]) 02/24/2015  . Hypothermia 02/24/2015  . Elevated lactic acid level 02/24/2015  . Hyperthyroidism 02/24/2015  . Anemia due to GI blood loss 02/24/2015  . Schizophrenia ([place]) 02/24/2015  . Constipation   . Chronic osteomyelitis ([place]) 06/26/2013    Electa Sniff, PT, DPT, NCS 04/29/2020, 11:15 AM  [facility] [address] [place], [place], [address] Phone: [phone]   Fax:  [phone]  Name: [name] MRN: [id number] Date of Birth: 22-Dec-1970

## 2020-05-04 ENCOUNTER — Ambulatory Visit: Payer: Medicaid Other | Admitting: Physical Therapy

## 2020-05-06 ENCOUNTER — Encounter: Payer: Self-pay | Admitting: Physical Therapy

## 2020-05-06 ENCOUNTER — Ambulatory Visit: Payer: Medicaid Other | Admitting: Physical Therapy

## 2020-05-06 ENCOUNTER — Other Ambulatory Visit: Payer: Self-pay

## 2020-05-06 DIAGNOSIS — R2689 Other abnormalities of gait and mobility: Secondary | ICD-10-CM

## 2020-05-06 DIAGNOSIS — R293 Abnormal posture: Secondary | ICD-10-CM | POA: Diagnosis not present

## 2020-05-06 DIAGNOSIS — M6281 Muscle weakness (generalized): Secondary | ICD-10-CM

## 2020-05-06 NOTE — Therapy (Addendum)
[facility] [address] [place], [place], [address] Phone: [phone]   Fax:  [phone]  Physical Therapy Treatment  Patient Details  Name: LYNNE VALLAS MRN: [id number] Date of Birth: [birth year] Referring Provider (PT): Lucianne Lei, MD   Encounter Date: 05/06/2020   PT End of Session - 05/06/20 1406    Visit Number 12    Number of Visits 17    Authorization Type Medicaid    Authorization Time Period 3 visits from 01/15/20 through 02/04/20; 3 visist from 02/17/20-03/08/20, 4 visits 2/8-3-7, 9 visits 3/15-4/25    Authorization - Visit Number 4    Authorization - Number of Visits 9    PT Start Time [id number]    PT Stop Time [id number]    PT Time Calculation (min) 43 min    Equipment Utilized During Treatment Gait belt    Activity Tolerance Patient tolerated treatment well;No increased pain;Patient limited by fatigue    Behavior During Therapy Flat affect;WFL for tasks assessed/performed           Past Medical History:  Diagnosis Date  . Acute respiratory failure with hypoxia ([place])   . Anemia   . Chest pain   . Constipation   . Depression   . Diabetes mellitus   . ESRD (end stage renal disease) on dialysis [facility])    "[place] off 68; MWF" (02/25/2015)  . HCAP (healthcare-associated pneumonia)   . Hypertension   . Hyperthyroidism   . Hyperthyroidism   . Quadriparesis (muscle weakness)   . Schizo-affective schizophrenia [facility])     Past Surgical History:  Procedure Laterality Date  . DIALYSIS FISTULA CREATION    . MULTIPLE EXTRACTIONS WITH ALVEOLOPLASTY  01/17/2011   Procedure: MULTIPLE EXTRACION WITH ALVEOLOPLASTY;  Surgeon: Gae Bon;  [facility];  Service: Oral Surgery;  Laterality: Bilateral;  Extraction of number one, sixteen, seventeen, and thirty two.     There were no vitals filed for this visit.   Subjective Assessment - 05/06/20 1406    Subjective No new complaints. Caregiver  reports they called on Tuesday and were told all 2pm appts had been cancelled as the bathrooms were not working so they did not come.    Patient is accompained by: Family member   caregiver Collie Siad   Pertinent History PMH: ESRD, Type-II DM, HTN, legal blindness, schizophrenia, secondary hyperparathyroidism, and quadriparesis,    Limitations Sitting;Lifting;Standing;Walking;Writing;House hold activities    Patient Stated Goals Pt would like to be able to walk again. Caregiver would like him to be able to help more with activities.    Currently in Pain? No/denies                 [facility] Adult PT Treatment/Exercise - 05/06/20 1407      Transfers   Transfers Sit to Stand;Stand to Sit;Lateral/Scoot Transfers    Sit to Stand 3: Mod assist;With upper extremity assist;From chair/3-in-1    Sit to Stand Details Tactile cues for weight shifting    Sit to Stand Details (indicate cue type and reason) assist to power up to standing in parallel bars with assist/facilitation at pelvis, bil knees blocked with each stand.    Stand to Sit 3: Mod assist;With upper extremity assist;To chair/3-in-1    Stand to Sit Details (indicate cue type and reason) Tactile cues for initiation;Tactile cues for weight shifting;Verbal cues for sequencing;Verbal cues for technique;Manual facilitation for weight shifting;Manual facilitation for weight bearing    Stand to Sit Details  assist to control descent to wheelchair after both stands with pt using UE assist.    Comments stood for ~2 minutes in paralllel bars with mod with pad at pelvis, knees blocked and tech guarding trunk. cues for upright posture and hip/knee extension with standing. Heavy UE reliance on bars with standing.      Knee/Hip Exercises: Aerobic   Stepper Seated in wheelchair- working on use of floor stepper with band resistance to see if a floor stepper will work at home. Pt was able to press down on the pedals with cues on technique. Pt's caregiver plans to look  into getting one for home use. Advised to get the stepper pedals, not circle pedals as pt most likely would not be able to propel a circle pedal bike. Caregiver verbalized understanding.    Other Aerobic Sci-Fit level 2.5 with rpm ~20 with BUE and BLE x 8 min for strengthening, ROM and improving activity tolerance. PT assisted to stabilize w/c and provided tactile cues to try to keep right leg in more neutral position for some time.                    PT Short Term Goals - 04/06/20 1648      PT SHORT TERM GOAL #1   Title STGs=LTGs             PT Long Term Goals - 04/06/20 1648      PT LONG TERM GOAL #1   Title Pt will be able to complete all bed mobility with supervision for increased independence and decreased caregiver burden. (updated target dates to 3/1 due to missed visits)    Baseline 04/06/20 supervision with supine to sit, min assist at legs with sit to supine    Time 6    Period Weeks    Status On-going    Target Date 05/18/20      PT LONG TERM GOAL #2   Title Pt will be able to perform sit to stand transfer in // bars or at sink min assist for improved strength.    Baseline Performed in // bars mod assist for sit to stand pulling on bars and PT blocking legs 04/06/20    Time 6    Period Weeks    Status Revised    Target Date 05/18/20      PT LONG TERM GOAL #3   Title Pt will be able to maintain standing in // bars x 3 min min assist for improved standing balance to assist with ADLs.    Baseline 04/06/20 1 min mod assist of 2 people.    Time 6    Period Weeks    Status New    Target Date 05/18/20      PT LONG TERM GOAL #4   Title Pt will be able to complete squat-pivot/lateral scoot transfers with supervision for increased independence and decreased caregiver burden.    Baseline 04/06/20 lateral scoot transfers supervision/CGA.    Time 6    Period Weeks    Status Revised    Target Date 05/18/20      PT LONG TERM GOAL #5   Title Pt will be able to ambulate  8' in // bars mod assist.    Baseline nonambulatory    Time 6    Period Weeks    Status New    Target Date 05/18/20                 Plan - 05/06/20  2018    Clinical Impression Statement Today's skilled session continued to focus on strengthening and standing with UE support. Pt continues to need cues for posture with facilitation at time due to forward lean and bil knee blocked due to buckling. Pt's caregiver asked about a way to work his legs only at home similar to the [employer]. Showed the pt and caregiver a floor stepper, Pt able to press the pedals of the bike with cues. Pt's caregiver plans to look into one for him to use at home. The pt should benefit from continued PT to progress toward unmet goals.    Personal Factors and Comorbidities Comorbidity 3+;Past/Current Experience;Social Background;Time since onset of injury/illness/exacerbation    Comorbidities ESRD, Type-II DM, HTN, legal blindness, schizophrenia, secondary hyperparathyroidism, and quadriparesis    Examination-Activity Limitations --    Stability/Clinical Decision Making Evolving/Moderate complexity    Clinical Decision Making Moderate    Rehab Potential Fair    PT Frequency 2x / week   followed by 1x week for 3 weeks   PT Duration 3 weeks    PT Treatment/Interventions ADLs/Self Care Home Management;Gait training;Stair training;Functional mobility training;Therapeutic activities;Therapeutic exercise;Balance training;Neuromuscular re-education;DME Instruction;Cognitive remediation;Patient/family education;Wheelchair mobility training;Manual techniques;Visual/perceptual remediation/compensation;Orthotic Fit/Training    PT Next Visit Plan Warm up on SciFit x 8 min. Continue with functional strengthening with transfers, sitting balance activities to engage core more as well as back extensors. Standing in // bars. May need second person to help get trunk upright to start with PT blocking legs.    PT Home Exercise Plan Access  Code: [id number]; [id number]    Consulted and Agree with Plan of Care Other (Comment)    Family Member Consulted Caregiver, Manuela Schwartz           Patient will benefit from skilled therapeutic intervention in order to improve the following deficits and impairments:  Abnormal gait,Decreased range of motion,Difficulty walking,Impaired UE functional use,Decreased activity tolerance,Impaired perceived functional ability,Impaired vision/preception,Decreased balance,Decreased cognition,Decreased strength,Decreased mobility,Impaired sensation  Visit Diagnosis: Abnormal posture  Muscle weakness (generalized)  Other abnormalities of gait and mobility     Problem List Patient Active Problem List   Diagnosis Date Noted  . Acute respiratory failure with hypoxia ([place]) 03/08/2015  . HCAP (healthcare-associated pneumonia) 03/08/2015  . Chest pain 03/08/2015  . Quadriparesis (muscle weakness)   . ESRD (end stage renal disease) on dialysis ([place]) 02/24/2015  . Constipation, acute 02/24/2015  . Normocytic anemia 02/24/2015  . Occult GI bleeding 02/24/2015  . Hypertension 02/24/2015  . Thrombocytopenia ([place]) 02/24/2015  . Hypothermia 02/24/2015  . Elevated lactic acid level 02/24/2015  . Hyperthyroidism 02/24/2015  . Anemia due to GI blood loss 02/24/2015  . Schizophrenia ([place]) 02/24/2015  . Constipation   . Chronic osteomyelitis ([place]) 06/26/2013   Willow Ora, PTA, [facility] [address], [place], [place] [address] [phone] 05/06/20, 9:24 PM   Name: DARA LOMANTO MRN: [id number] Date of Birth: February 09, 1970

## 2020-05-11 ENCOUNTER — Encounter: Payer: Self-pay | Admitting: Physical Therapy

## 2020-05-11 ENCOUNTER — Ambulatory Visit: Payer: Medicaid Other | Attending: Family Medicine | Admitting: Physical Therapy

## 2020-05-11 ENCOUNTER — Other Ambulatory Visit: Payer: Self-pay

## 2020-05-11 DIAGNOSIS — M6281 Muscle weakness (generalized): Secondary | ICD-10-CM

## 2020-05-11 DIAGNOSIS — R293 Abnormal posture: Secondary | ICD-10-CM | POA: Insufficient documentation

## 2020-05-11 DIAGNOSIS — R2689 Other abnormalities of gait and mobility: Secondary | ICD-10-CM | POA: Diagnosis present

## 2020-05-12 NOTE — Therapy (Signed)
[facility] [address] [place], [place], [address] Phone: [phone]   Fax:  [phone]  Physical Therapy Treatment  Patient Details  Name: [name] MRN: [id number] Date of Birth: 1970/03/02 Referring Provider (PT): Lucianne Lei, MD   Encounter Date: 05/11/2020   PT End of Session - 05/11/20 1407    Visit Number 13    Number of Visits 17    Authorization Type Medicaid    Authorization Time Period 3 visits from 01/15/20 through 02/04/20; 3 visist from 02/17/20-03/08/20, 4 visits 2/8-3-7, 9 visits 3/15-4/25    Authorization - Visit Number 5    Authorization - Number of Visits 9    PT Start Time [id number]    PT Stop Time [id number]    PT Time Calculation (min) 40 min    Equipment Utilized During Treatment Gait belt    Activity Tolerance Patient tolerated treatment well;No increased pain;Patient limited by fatigue    Behavior During Therapy Flat affect;WFL for tasks assessed/performed           Past Medical History:  Diagnosis Date  . Acute respiratory failure with hypoxia ([place])   . Anemia   . Chest pain   . Constipation   . Depression   . Diabetes mellitus   . ESRD (end stage renal disease) on dialysis [facility])    "[place] off 68; MWF" (02/25/2015)  . HCAP (healthcare-associated pneumonia)   . Hypertension   . Hyperthyroidism   . Hyperthyroidism   . Quadriparesis (muscle weakness)   . Schizo-affective schizophrenia [facility])     Past Surgical History:  Procedure Laterality Date  . DIALYSIS FISTULA CREATION    . MULTIPLE EXTRACTIONS WITH ALVEOLOPLASTY  01/17/2011   Procedure: MULTIPLE EXTRACION WITH ALVEOLOPLASTY;  Surgeon: Gae Bon;  Location: [facility] OR;  Service: Oral Surgery;  Laterality: Bilateral;  Extraction of number one, sixteen, seventeen, and thirty two.     There were no vitals filed for this visit.   Subjective Assessment - 05/11/20 1407    Subjective No new complaints. No falls or  pain to report. Pt states the HEP is going well at home.    Patient is accompained by: Family member   caregiver Collie Siad   Pertinent History PMH: ESRD, Type-II DM, HTN, legal blindness, schizophrenia, secondary hyperparathyroidism, and quadriparesis,    Patient Stated Goals Pt would like to be able to walk again. Caregiver would like him to be able to help more with activities.    Currently in Pain? No/denies               [facility] Adult PT Treatment/Exercise - 05/11/20 1408      Transfers   Transfers Sit to Stand;Stand to Sit;Lateral/Scoot Transfers    Lateral/Scoot Transfers 5: Supervision    Lateral/Scoot Transfer Details (indicate cue type and reason) wheelchair<>mat table with directional cues needed due to pt being legally blind. increased time needed, no physical assistance needed.      Neuro Re-ed    Neuro Re-ed Details  for strengthening/sitting balance: lateral elbow taps to mat x 10 each way with cues on form/technique. Then with 2# weighted ball- UE raises,  then chest press x 10 reps each with emphasis on tall positure in midline position. Min guard assist for safety      Exercises   Exercises Other Exercises    Other Exercises  Seated edge of mat with inverted chair/pillows behind pt: reverse crunch x 10 against green theraband with empahssis  on controlled movements/staying in midline postion without UE support, min guard assist for safety.      Knee/Hip Exercises: Aerobic   Other Aerobic Sci-Fit level 2.5 with rpm ~20 with BUE and BLE x 8 min for strengthening, ROM and improving activity tolerance. PT assisted to stabilize w/c and provided tactile cues to try to keep right leg in more neutral position for some time.                    PT Short Term Goals - 04/06/20 1648      PT SHORT TERM GOAL #1   Title STGs=LTGs             PT Long Term Goals - 04/06/20 1648      PT LONG TERM GOAL #1   Title Pt will be able to complete all bed mobility with supervision  for increased independence and decreased caregiver burden. (updated target dates to 3/1 due to missed visits)    Baseline 04/06/20 supervision with supine to sit, min assist at legs with sit to supine    Time 6    Period Weeks    Status On-going    Target Date 05/18/20      PT LONG TERM GOAL #2   Title Pt will be able to perform sit to stand transfer in // bars or at sink min assist for improved strength.    Baseline Performed in // bars mod assist for sit to stand pulling on bars and PT blocking legs 04/06/20    Time 6    Period Weeks    Status Revised    Target Date 05/18/20      PT LONG TERM GOAL #3   Title Pt will be able to maintain standing in // bars x 3 min min assist for improved standing balance to assist with ADLs.    Baseline 04/06/20 1 min mod assist of 2 people.    Time 6    Period Weeks    Status New    Target Date 05/18/20      PT LONG TERM GOAL #4   Title Pt will be able to complete squat-pivot/lateral scoot transfers with supervision for increased independence and decreased caregiver burden.    Baseline 04/06/20 lateral scoot transfers supervision/CGA.    Time 6    Period Weeks    Status Revised    Target Date 05/18/20      PT LONG TERM GOAL #5   Title Pt will be able to ambulate 8' in // bars mod assist.    Baseline nonambulatory    Time 6    Period Weeks    Status New    Target Date 05/18/20                 Plan - 05/11/20 1408    Clinical Impression Statement Today's skilled session continued to focus on strengthening and lateral transfers wtih no issues noted or reported. Unable to work on standing this session as parallel bars unavailable. The pt should benefit from continued PT to continue to address transfer, strengthening and standing.    Personal Factors and Comorbidities Comorbidity 3+;Past/Current Experience;Social Background;Time since onset of injury/illness/exacerbation    Comorbidities ESRD, Type-II DM, HTN, legal blindness, schizophrenia,  secondary hyperparathyroidism, and quadriparesis    Stability/Clinical Decision Making Evolving/Moderate complexity    Rehab Potential Fair    PT Frequency 2x / week   followed by 1x week for 3 weeks   PT Duration  3 weeks    PT Treatment/Interventions ADLs/Self Care Home Management;Gait training;Stair training;Functional mobility training;Therapeutic activities;Therapeutic exercise;Balance training;Neuromuscular re-education;DME Instruction;Cognitive remediation;Patient/family education;Wheelchair mobility training;Manual techniques;Visual/perceptual remediation/compensation;Orthotic Fit/Training    PT Next Visit Plan Warm up on SciFit x 8 min. Continue with functional strengthening with transfers, sitting balance activities to engage core more as well as back extensors. Standing in // bars. May need second person to help get trunk upright to start with PT blocking legs.    PT Home Exercise Plan Access Code: [email or web address]    Consulted and Agree with Plan of Care Other (Comment)    Family Member Consulted Caregiver, Manuela Schwartz           Patient will benefit from skilled therapeutic intervention in order to improve the following deficits and impairments:  Abnormal gait,Decreased range of motion,Difficulty walking,Impaired UE functional use,Decreased activity tolerance,Impaired perceived functional ability,Impaired vision/preception,Decreased balance,Decreased cognition,Decreased strength,Decreased mobility,Impaired sensation  Visit Diagnosis: Abnormal posture  Muscle weakness (generalized)  Other abnormalities of gait and mobility     Problem List Patient Active Problem List   Diagnosis Date Noted  . Acute respiratory failure with hypoxia ([place]) 03/08/2015  . HCAP (healthcare-associated pneumonia) 03/08/2015  . Chest pain 03/08/2015  . Quadriparesis (muscle weakness)   . ESRD (end stage renal disease) on dialysis ([place]) 02/24/2015  . Constipation, acute 02/24/2015  . Normocytic anemia  02/24/2015  . Occult GI bleeding 02/24/2015  . Hypertension 02/24/2015  . Thrombocytopenia ([place]) 02/24/2015  . Hypothermia 02/24/2015  . Elevated lactic acid level 02/24/2015  . Hyperthyroidism 02/24/2015  . Anemia due to GI blood loss 02/24/2015  . Schizophrenia ([place]) 02/24/2015  . Constipation   . Chronic osteomyelitis ([place]) 06/26/2013    Willow Ora, PTA, [facility] [address], [place], [place] [address] [phone] 05/12/20, 6:38 PM   Name: [name] MRN: [id number] Date of Birth: 19-Apr-1970

## 2020-05-18 ENCOUNTER — Other Ambulatory Visit: Payer: Self-pay

## 2020-05-18 ENCOUNTER — Ambulatory Visit: Payer: Medicaid Other

## 2020-05-18 DIAGNOSIS — M6281 Muscle weakness (generalized): Secondary | ICD-10-CM

## 2020-05-18 DIAGNOSIS — R293 Abnormal posture: Secondary | ICD-10-CM

## 2020-05-18 DIAGNOSIS — R2689 Other abnormalities of gait and mobility: Secondary | ICD-10-CM

## 2020-05-18 NOTE — Therapy (Signed)
[facility] [address] [place], [place], [address] Phone: [phone]   Fax:  [phone]  Physical Therapy Treatment  Patient Details  Name: [name] MRN: [id number] Date of Birth: 1970/08/24 Referring Provider (PT): Lucianne Lei, MD   Encounter Date: 05/18/2020   PT End of Session - 05/18/20 1409    Visit Number 14    Number of Visits 17    Authorization Type Medicaid    Authorization Time Period 3 visits from 01/15/20 through 02/04/20; 3 visist from 02/17/20-03/08/20, 4 visits 2/8-3-7, 9 visits 3/15-4/25    Authorization - Visit Number 6    Authorization - Number of Visits 9    PT Start Time 3818   pt arrived late   PT Stop Time 1447    PT Time Calculation (min) 39 min    Equipment Utilized During Treatment Gait belt    Activity Tolerance Patient tolerated treatment well;No increased pain;Patient limited by fatigue    Behavior During Therapy Flat affect;WFL for tasks assessed/performed           Past Medical History:  Diagnosis Date  . Acute respiratory failure with hypoxia ([place])   . Anemia   . Chest pain   . Constipation   . Depression   . Diabetes mellitus   . ESRD (end stage renal disease) on dialysis [facility])    "[place] off 68; MWF" (02/25/2015)  . HCAP (healthcare-associated pneumonia)   . Hypertension   . Hyperthyroidism   . Hyperthyroidism   . Quadriparesis (muscle weakness)   . Schizo-affective schizophrenia [facility])     Past Surgical History:  Procedure Laterality Date  . DIALYSIS FISTULA CREATION    . MULTIPLE EXTRACTIONS WITH ALVEOLOPLASTY  01/17/2011   Procedure: MULTIPLE EXTRACION WITH ALVEOLOPLASTY;  Surgeon: Gae Bon;  Location: [facility] OR;  Service: Oral Surgery;  Laterality: Bilateral;  Extraction of number one, sixteen, seventeen, and thirty two.     There were no vitals filed for this visit.   Subjective Assessment - 05/18/20 1409    Subjective Pt reports  that wrists are sore on and off.    Patient is accompained by: Family member   caregiver Collie Siad   Pertinent History PMH: ESRD, Type-II DM, HTN, legal blindness, schizophrenia, secondary hyperparathyroidism, and quadriparesis,    Patient Stated Goals Pt would like to be able to walk again. Caregiver would like him to be able to help more with activities.    Currently in Pain? No/denies                             [facility] Adult PT Treatment/Exercise - 05/18/20 1410      Transfers   Transfers Stand to Sit;Sit to Stand    Sit to Stand 3: Mod assist;2: Max assist   +2   Sit to Stand Details Verbal cues for technique    Sit to Stand Details (indicate cue type and reason) Pt performed sit to stand x 3 in // bars pulling on bars mod assist with getting upright quickly with PT blocking legs. Rehab tech only standing by today. With sit to stand pulling at sink with pillow in front to pad knees to black against counter max assist +2 to rise with assist to keep trunk more upright and bottom tucked.    Stand to Sit 3: Mod assist    Stand to Sit Details (indicate cue type and reason) Verbal cues for  technique    Comments Pt stood 3 bouts in // bars: 1 min, 1 min 40 sec, 1 min 40 sec. PT was in front blocking knees and assisting to pull hips forward. Pt was cued to try to bring hands forward just a little to not be hanging on arms but to try to put more weight through legs. Rehab tech just standing by for safety today.Then at counter x 2 bouts about 20 sec with max assist +2 with pillow in front of knees to pad to block against cabinets. Verbal and tactile cues to try to keep chest up and tuck bottom more.      Exercises   Exercises Other Exercises      Knee/Hip Exercises: Aerobic   Other Aerobic SciFit x 8  min level 3 with BUE and BLE with PT cuing pt to try to keep right hip in a more neutral position as tends to internally rotate some. Pt kept RPMs >20.                    PT  Short Term Goals - 04/06/20 1648      PT SHORT TERM GOAL #1   Title STGs=LTGs             PT Long Term Goals - 05/18/20 1544      PT LONG TERM GOAL #1   Title Pt will be able to complete all bed mobility with supervision for increased independence and decreased caregiver burden. (updated target dates to 3/1 due to missed visits)    Baseline 04/06/20 supervision with supine to sit, min assist at legs with sit to supine    Time 6    Period Weeks    Status On-going      PT LONG TERM GOAL #2   Title Pt will be able to perform sit to stand transfer in // bars or at sink min assist for improved strength.    Baseline Performed in // bars mod assist for sit to stand pulling on bars and PT blocking legs 04/06/20. 05/18/20 Mod assist at // bars, max assist +2 at sink with knees blocked    Time 6    Period Weeks    Status Not Met      PT LONG TERM GOAL #3   Title Pt will be able to maintain standing in // bars x 3 min min assist for improved standing balance to assist with ADLs.    Baseline 04/06/20 1 min mod assist of 2 people.. 05/18/20 1 min 40 sec mod assist to block legs    Time 6    Period Weeks    Status Not Met      PT LONG TERM GOAL #4   Title Pt will be able to complete squat-pivot/lateral scoot transfers with supervision for increased independence and decreased caregiver burden.    Baseline 04/06/20 lateral scoot transfers supervision/CGA.    Time 6    Period Weeks    Status Revised      PT LONG TERM GOAL #5   Title Pt will be able to ambulate 8' in // bars mod assist.    Baseline nonambulatory    Time 6    Period Weeks    Status New                 Plan - 05/18/20 1548    Clinical Impression Statement PT continued to work on strengthening, sit to stand and standing tolerance today. Pt tolerated  increased resistance on Sci Fit well today. He was able to stand in // bars with only PT assist today with more upright posture throughout. PT still has to block knees. Trialed  standing at counter for first time which did require more asssistance to rise with pt having more difficulty getting upright as could not rely on arms as much. PT discussed with pt with plan to recert next visit 1x/week for 4 more weeks to see if we can get him to a point he can stand at sink with caregiver assist which would help more with ADLs. Pt in agreement.    Personal Factors and Comorbidities Comorbidity 3+;Past/Current Experience;Social Background;Time since onset of injury/illness/exacerbation    Comorbidities ESRD, Type-II DM, HTN, legal blindness, schizophrenia, secondary hyperparathyroidism, and quadriparesis    Stability/Clinical Decision Making Evolving/Moderate complexity    Rehab Potential Fair    PT Frequency 2x / week   followed by 1x week for 3 weeks   PT Duration 3 weeks    PT Treatment/Interventions ADLs/Self Care Home Management;Gait training;Stair training;Functional mobility training;Therapeutic activities;Therapeutic exercise;Balance training;Neuromuscular re-education;DME Instruction;Cognitive remediation;Patient/family education;Wheelchair mobility training;Manual techniques;Visual/perceptual remediation/compensation;Orthotic Fit/Training    PT Next Visit Plan [place]- can you please check remaining goals next visit and request 1x/week for 4 more weeks on recert and with medicaid. I would think most goals would remain the same unless you need to adjust slightly but would remove the gait goal at this time. Big focus will be on transfers, improving core stability and functional strengthening to try to be able to stand at sink. His caregiver was supposed to call back to schedule. Make sure she did. Thank you! Warm up on SciFit x 8 min. Continue with functional strengthening with transfers, sitting balance activities to engage core more as well as back extensors. Standing in // bars. May need second person to help get trunk upright to start with PT blocking legs.    PT Home Exercise  Plan Access Code: [email or web address]    Consulted and Agree with Plan of Care Other (Comment)    Family Member Consulted Caregiver, Manuela Schwartz           Patient will benefit from skilled therapeutic intervention in order to improve the following deficits and impairments:  Abnormal gait,Decreased range of motion,Difficulty walking,Impaired UE functional use,Decreased activity tolerance,Impaired perceived functional ability,Impaired vision/preception,Decreased balance,Decreased cognition,Decreased strength,Decreased mobility,Impaired sensation  Visit Diagnosis: Other abnormalities of gait and mobility  Muscle weakness (generalized)  Abnormal posture     Problem List Patient Active Problem List   Diagnosis Date Noted  . Acute respiratory failure with hypoxia ([place]) 03/08/2015  . HCAP (healthcare-associated pneumonia) 03/08/2015  . Chest pain 03/08/2015  . Quadriparesis (muscle weakness)   . ESRD (end stage renal disease) on dialysis ([place]) 02/24/2015  . Constipation, acute 02/24/2015  . Normocytic anemia 02/24/2015  . Occult GI bleeding 02/24/2015  . Hypertension 02/24/2015  . Thrombocytopenia ([place]) 02/24/2015  . Hypothermia 02/24/2015  . Elevated lactic acid level 02/24/2015  . Hyperthyroidism 02/24/2015  . Anemia due to GI blood loss 02/24/2015  . Schizophrenia ([place]) 02/24/2015  . Constipation   . Chronic osteomyelitis ([place]) 06/26/2013    Electa Sniff, PT, DPT, NCS 05/18/2020, 3:54 PM  [facility] [address] [place], [place], [address] Phone: [phone]   Fax:  [phone]  Name: [name] MRN: [id number] Date of Birth: Apr 20, 1970

## 2020-05-25 ENCOUNTER — Ambulatory Visit: Payer: Medicaid Other | Admitting: Physical Therapy

## 2020-05-25 ENCOUNTER — Other Ambulatory Visit: Payer: Self-pay

## 2020-05-25 ENCOUNTER — Encounter: Payer: Self-pay | Admitting: Physical Therapy

## 2020-05-25 DIAGNOSIS — M6281 Muscle weakness (generalized): Secondary | ICD-10-CM

## 2020-05-25 DIAGNOSIS — R2689 Other abnormalities of gait and mobility: Secondary | ICD-10-CM

## 2020-05-25 DIAGNOSIS — R293 Abnormal posture: Secondary | ICD-10-CM | POA: Diagnosis not present

## 2020-05-25 NOTE — Therapy (Signed)
[facility] [address] [place], [place], [address] Phone: [phone]   Fax:  [phone]  Physical Therapy Treatment  Patient Details  Name: [name] MRN: [id number] Date of Birth: 1970/11/11 Referring Provider (PT): Lucianne Lei, MD   Encounter Date: 05/25/2020   PT End of Session - 05/25/20 1608    Visit Number 15    Number of Visits 17    Authorization Type Medicaid    Authorization Time Period 3 visits from 01/15/20 through 02/04/20; 3 visist from 02/17/20-03/08/20, 4 visits 2/8-3-7, 9 visits 3/15-4/25    Authorization - Visit Number 6    Authorization - Number of Visits 10    PT Start Time 9169    PT Stop Time 1450    PT Time Calculation (min) 45 min    Equipment Utilized During Treatment Gait belt    Activity Tolerance Patient tolerated treatment well;No increased pain;Patient limited by fatigue    Behavior During Therapy Flat affect;WFL for tasks assessed/performed           Past Medical History:  Diagnosis Date  . Acute respiratory failure with hypoxia ([place])   . Anemia   . Chest pain   . Constipation   . Depression   . Diabetes mellitus   . ESRD (end stage renal disease) on dialysis [facility])    "[place] off 68; MWF" (02/25/2015)  . HCAP (healthcare-associated pneumonia)   . Hypertension   . Hyperthyroidism   . Hyperthyroidism   . Quadriparesis (muscle weakness)   . Schizo-affective schizophrenia [facility])     Past Surgical History:  Procedure Laterality Date  . DIALYSIS FISTULA CREATION    . MULTIPLE EXTRACTIONS WITH ALVEOLOPLASTY  01/17/2011   Procedure: MULTIPLE EXTRACION WITH ALVEOLOPLASTY;  Surgeon: Gae Bon;  Location: [facility] OR;  Service: Oral Surgery;  Laterality: Bilateral;  Extraction of number one, sixteen, seventeen, and thirty two.     There were no vitals filed for this visit.   Subjective Assessment - 05/25/20 1554    Subjective Pt reports no new changes or  problems - states he is trying to stand some at home with assistance    Patient is accompained by: Family member   caregiver Collie Siad   Pertinent History PMH: ESRD, Type-II DM, HTN, legal blindness, schizophrenia, secondary hyperparathyroidism, and quadriparesis,    Patient Stated Goals Pt would like to be able to walk again. Caregiver would like him to be able to help more with activities.    Currently in Pain? No/denies                             [facility] Adult PT Treatment/Exercise - 05/25/20 1555      Bed Mobility   Bed Mobility Rolling Right;Rolling Left;Sit to Supine;Supine to Sit    Rolling Right Contact Guard/Touching assist    Rolling Left Contact Guard/Touching assist    Supine to Sit Moderate Assistance - Patient 50-74%    Sitting - Scoot to Edge of Bed Contact Guard/Touching assist    Sit to Supine Moderate Assistance - Patient 50-74%    Sit to Supine - Details (indicate cue type and reason) pt needs mod assist to transfer LE's onto mat table from sitting to sidelying position      Transfers   Transfers Lateral/Scoot Transfers    Sit to Stand 3: Mod assist    Sit to Stand Details Verbal cues for technique;Tactile cues for  placement    Sit to Stand Details (indicate cue type and reason) Pt attempted sit to stand transfer from wheelchair to // bars with pt pulling up with bil. UE's on bars; assisted to standing position with seat harness/belt used with standing frame with mod assist of 2nd person, with PT blocking knees to prevent buckling; pt unable to stand in today's session    Stand to Sit 4: Min assist    Lateral/Scoot Transfers 4: Min assist;3: Mod assist   min assist w/c to mat:  mod assist mat to w/c toward Lt side     Neuro Re-ed    Neuro Re-ed Details  Pt performed trunk flexion/extension on side of mat with pt lying back on 1 pillow on inverted chair - pulling forward for abdominal strengthening 10 reps with CGA;  attempted sitting on SitFit but pt reported  "this is too hard" so this acitvity for trunk control/strengthening  was discontinued      Knee/Hip Exercises: Aerobic   Other Aerobic SciFit level 2.5 x 8" from wheelchair - cues to attempt to abduct LLE to reduce adduction in order to achieve more neutral alignment                    PT Short Term Goals - 04/06/20 1648      PT SHORT TERM GOAL #1   Title STGs=LTGs             PT Long Term Goals - 05/25/20 1609      PT LONG TERM GOAL #1   Title Pt will be able to complete all bed mobility with supervision for increased independence and decreased caregiver burden. (updated target dates to 3/1 due to missed visits)    Baseline 04/06/20 supervision with supine to sit, min assist at legs with sit to supine; 05-25-20 -Goal not met as pt requires mod assist to transfer LE's onto mat from sitting to sidelying position    Time 6    Period Weeks    Status Not Met      PT LONG TERM GOAL #2   Title Pt will be able to perform sit to stand transfer in // bars or at sink min assist for improved strength.    Baseline Performed in // bars mod assist for sit to stand pulling on bars and PT blocking legs 04/06/20. 05/18/20 Mod assist at // bars, max assist +2 at sink with knees blocked    Time 6    Period Weeks    Status Not Met      PT LONG TERM GOAL #3   Title Pt will be able to maintain standing in // bars x 3 min min assist for improved standing balance to assist with ADLs.    Baseline 04/06/20 1 min mod assist of 2 people.. 05/18/20 1 min 40 sec mod assist to block legs    Time 6    Period Weeks    Status Not Met      PT LONG TERM GOAL #4   Title Pt will be able to complete squat-pivot/lateral scoot transfers with supervision for increased independence and decreased caregiver burden.    Baseline 04/06/20 lateral scoot transfers supervision/CGA.; 05-25-20 - pt required min assist for w/c to mat and mod assist for mat to w/c toward Lt side    Time 6    Period Weeks    Status Not Met       PT LONG TERM GOAL #5   Title  Pt will be able to ambulate 8' in // bars mod assist.    Baseline nonambulatory ; 05-25-20 - deferred - pt remains nonambulatory - had difficulty with standing with assistance on 05-25-20    Time 6    Period Weeks    Status Deferred                 Plan - 05/25/20 1627    Clinical Impression Statement Pt has attended 7/9 authorized visits form 04-20-20 - 05-31-20.  No LTG's have been met; performance appears to fluctuate as pt unable to stand in today's session, however, pt was able to stand for 1" during session on 05-18-20 per chart note.  Attribute pt's inconsistency with possibly being with new therapist and not feeling as comfortable as he does with primary PT; pt is blind which may contribute to fear and hesistancy with mobility.  LTG's remain unchanged as they have not been consistently achieved; gait goal is deferred due to pt remaining nonambulatory and unable to stand in today's session (05-25-20).    Personal Factors and Comorbidities Comorbidity 3+;Past/Current Experience;Social Background;Time since onset of injury/illness/exacerbation    Comorbidities ESRD, Type-II DM, HTN, legal blindness, schizophrenia, secondary hyperparathyroidism, and quadriparesis    Stability/Clinical Decision Making Evolving/Moderate complexity    Rehab Potential Fair    PT Frequency 1x / week   followed by 1x week for 3 weeks   PT Duration 4 weeks    PT Treatment/Interventions ADLs/Self Care Home Management;Gait training;Stair training;Functional mobility training;Therapeutic activities;Therapeutic exercise;Balance training;Neuromuscular re-education;DME Instruction;Cognitive remediation;Patient/family education;Wheelchair mobility training;Manual techniques;Visual/perceptual remediation/compensation;Orthotic Fit/Training    PT Next Visit Plan Big focus will be on transfers, improving core stability and functional strengthening to try to be able to stand at sink. His caregiver  was supposed to call back to schedule. Make sure she did. Thank you! Warm up on SciFit x 8 min. Continue with functional strengthening with transfers, sitting balance activities to engage core more as well as back extensors. Standing in // bars. May need second person to help get trunk upright to start with PT blocking legs.    PT Home Exercise Plan Access Code: [email or web address]    Consulted and Agree with Plan of Care Other (Comment)    Family Member Consulted Caregiver, Manuela Schwartz           Patient will benefit from skilled therapeutic intervention in order to improve the following deficits and impairments:  Abnormal gait,Decreased range of motion,Difficulty walking,Impaired UE functional use,Decreased activity tolerance,Impaired perceived functional ability,Impaired vision/preception,Decreased balance,Decreased cognition,Decreased strength,Decreased mobility,Impaired sensation  Visit Diagnosis: Other abnormalities of gait and mobility - Plan: PT plan of care cert/re-cert  Muscle weakness (generalized) - Plan: PT plan of care cert/re-cert     Problem List Patient Active Problem List   Diagnosis Date Noted  . Acute respiratory failure with hypoxia ([place]) 03/08/2015  . HCAP (healthcare-associated pneumonia) 03/08/2015  . Chest pain 03/08/2015  . Quadriparesis (muscle weakness)   . ESRD (end stage renal disease) on dialysis ([place]) 02/24/2015  . Constipation, acute 02/24/2015  . Normocytic anemia 02/24/2015  . Occult GI bleeding 02/24/2015  . Hypertension 02/24/2015  . Thrombocytopenia ([place]) 02/24/2015  . Hypothermia 02/24/2015  . Elevated lactic acid level 02/24/2015  . Hyperthyroidism 02/24/2015  . Anemia due to GI blood loss 02/24/2015  . Schizophrenia ([place]) 02/24/2015  . Constipation   . Chronic osteomyelitis ([place]) 06/26/2013    Needham Biggins, [place], PT 05/25/2020, 4:40 PM  [facility] [address]  [place], [place], [address] Phone: [phone]   Fax:  [phone]  Name: [name] MRN: [id number] Date of Birth: November 19, 1970

## 2020-05-31 ENCOUNTER — Ambulatory Visit: Payer: Medicaid Other | Admitting: Physical Therapy

## 2020-06-01 ENCOUNTER — Ambulatory Visit: Payer: Medicaid Other

## 2020-06-08 ENCOUNTER — Ambulatory Visit: Payer: Medicaid Other | Attending: Family Medicine

## 2020-06-08 ENCOUNTER — Other Ambulatory Visit: Payer: Self-pay

## 2020-06-08 DIAGNOSIS — M6281 Muscle weakness (generalized): Secondary | ICD-10-CM | POA: Insufficient documentation

## 2020-06-08 DIAGNOSIS — R293 Abnormal posture: Secondary | ICD-10-CM | POA: Diagnosis present

## 2020-06-08 NOTE — Therapy (Signed)
[facility] [address] [place], [place], [address] Phone: [phone]   Fax:  [phone]  Physical Therapy Treatment  Patient Details  Name: [name] MRN: [id number] Date of Birth: 03-20-1970 Referring Provider (PT): Lucianne Lei, MD   Encounter Date: 06/08/2020   PT End of Session - 06/08/20 1407    Visit Number 16    Number of Visits 19    Authorization Type Medicaid    Authorization Time Period 3 visits from 01/15/20 through 02/04/20; 3 visist from 02/17/20-03/08/20, 4 visits 2/8-3-7, 9 visits 3/15-4/25, 5/3-5/30 4 visits    Authorization - Visit Number 1    Authorization - Number of Visits 4    PT Start Time 0347    PT Stop Time 1445    PT Time Calculation (min) 40 min    Equipment Utilized During Treatment Gait belt    Activity Tolerance Patient tolerated treatment well;No increased pain;Patient limited by fatigue    Behavior During Therapy Flat affect;WFL for tasks assessed/performed           Past Medical History:  Diagnosis Date  . Acute respiratory failure with hypoxia ([place])   . Anemia   . Chest pain   . Constipation   . Depression   . Diabetes mellitus   . ESRD (end stage renal disease) on dialysis [facility])    "[place] off 68; MWF" (02/25/2015)  . HCAP (healthcare-associated pneumonia)   . Hypertension   . Hyperthyroidism   . Hyperthyroidism   . Quadriparesis (muscle weakness)   . Schizo-affective schizophrenia [facility])     Past Surgical History:  Procedure Laterality Date  . DIALYSIS FISTULA CREATION    . MULTIPLE EXTRACTIONS WITH ALVEOLOPLASTY  01/17/2011   Procedure: MULTIPLE EXTRACION WITH ALVEOLOPLASTY;  Surgeon: Gae Bon;  Location: [facility] OR;  Service: Oral Surgery;  Laterality: Bilateral;  Extraction of number one, sixteen, seventeen, and thirty two.     There were no vitals filed for this visit.   Subjective Assessment - 06/08/20 1408    Subjective Pt reports he  is doing well. States they switched his w/c at dialysis and it is messed up.    Patient is accompained by: Family member   caregiver Collie Siad   Pertinent History PMH: ESRD, Type-II DM, HTN, legal blindness, schizophrenia, secondary hyperparathyroidism, and quadriparesis,    Patient Stated Goals Pt would like to be able to walk again. Caregiver would like him to be able to help more with activities.    Currently in Pain? No/denies                             [facility] Adult PT Treatment/Exercise - 06/08/20 1408      Transfers   Transfers --      Therapeutic Activites    Therapeutic Activities Other Therapeutic Activities    Other Therapeutic Activities Assessed left w/c brake. Screw is bent making brake difficult to lock only touching a small part of wheels. PT discussed with pt and caregiver, Manuela Schwartz, about need to get fixed. Manuela Schwartz reported that the doctor is trying to see if new w/c can be ordered but has not been 5 years. PT advised that since it came from [employer] they should take there for them to repair.      Exercises   Exercises Other Exercises    Other Exercises  Seated in w/c: bilateral scapular retraction 10 x 2 with green theraband,  trunk extension/flexion with green theraband resistance 10 x 2 with feet on floor, pushing through hands on armrests to raise bottom x 10 with verbal cues for form.      Knee/Hip Exercises: Aerobic   Other Aerobic Sci Fit level 2.5 x 11 min with tactile cues at times to try to get more right hip abduction as tends to adduct and IR. PT also stabilizing w/c due to broken left brake.                  PT Education - 06/08/20 1547    Education Details Education with pt and caregiver on how to go about getting brake on w/c fixed at [employer] as was supplier of chair. Discussed being sure to have someone with him to stabilize chair with any transfers due to safety risk with brake not locking correctly.    Person(s) Educated Patient;Caregiver(s)     Methods Explanation    Comprehension Verbalized understanding            PT Short Term Goals - 04/06/20 1648      PT SHORT TERM GOAL #1   Title STGs=LTGs             PT Long Term Goals - 06/08/20 1549      PT LONG TERM GOAL #1   Title Pt will be able to complete all bed mobility with supervision for increased independence and decreased caregiver burden. ( LTGs due 06/29/20)    Baseline 04/06/20 supervision with supine to sit, min assist at legs with sit to supine; 05-25-20 -Goal not met as pt requires mod assist to transfer LE's onto mat from sitting to sidelying position    Time 4    Period Weeks    Status Not Met    Target Date 06/29/20      PT LONG TERM GOAL #2   Title Pt will be able to perform sit to stand transfer in // bars or at sink min assist for improved strength.    Baseline Performed in // bars mod assist for sit to stand pulling on bars and PT blocking legs 04/06/20. 05/18/20 Mod assist at // bars, max assist +2 at sink with knees blocked    Time 4    Period Weeks    Status Not Met      PT LONG TERM GOAL #3   Title Pt will be able to maintain standing in // bars x 3 min min assist for improved standing balance to assist with ADLs.    Baseline 04/06/20 1 min mod assist of 2 people.. 05/18/20 1 min 40 sec mod assist to block legs    Time 4    Period Weeks    Status Not Met      PT LONG TERM GOAL #4   Title Pt will be able to complete squat-pivot/lateral scoot transfers with supervision for increased independence and decreased caregiver burden.    Baseline 04/06/20 lateral scoot transfers supervision/CGA.; 05-25-20 - pt required min assist for w/c to mat and mod assist for mat to w/c toward Lt side    Time 4    Period Weeks    Status Not Met      PT LONG TERM GOAL #5   Title Pt will be able to ambulate 8' in // bars mod assist.    Baseline nonambulatory ; 05-25-20 - deferred - pt remains nonambulatory - had difficulty with standing with assistance on 05-25-20    Time 4  Period Weeks    Status Deferred                 Plan - 06/08/20 1550    Clinical Impression Statement Pt able to increase time on SciFit today without difficulty. He reports legs feeling good with the movement and also feels like arms are working. [place] and transfers today due to w/c not locking correctly as left brake is bent. Instructed caregiver on how she could get it fixed.    Personal Factors and Comorbidities Comorbidity 3+;Past/Current Experience;Social Background;Time since onset of injury/illness/exacerbation    Comorbidities ESRD, Type-II DM, HTN, legal blindness, schizophrenia, secondary hyperparathyroidism, and quadriparesis    Stability/Clinical Decision Making Evolving/Moderate complexity    Rehab Potential Fair    PT Frequency 1x / week    PT Duration 4 weeks    PT Treatment/Interventions ADLs/Self Care Home Management;Gait training;Stair training;Functional mobility training;Therapeutic activities;Therapeutic exercise;Balance training;Neuromuscular re-education;DME Instruction;Cognitive remediation;Patient/family education;Wheelchair mobility training;Manual techniques;Visual/perceptual remediation/compensation;Orthotic Fit/Training    PT Next Visit Plan Did w/c brake get fixed? If so big focus will be on transfers, improving core stability and functional strengthening to try to be able to stand at sink.  Warm up on SciFit x 8 min. Continue with functional strengthening with transfers, sitting balance activities to engage core more as well as back extensors. Standing in // bars or at sink. May need second person to help get trunk upright to start with PT blocking legs.    PT Home Exercise Plan Access Code: [email or web address]    Consulted and Agree with Plan of Care Other (Comment)    Family Member Consulted Caregiver, Manuela Schwartz           Patient will benefit from skilled therapeutic intervention in order to improve the following deficits and impairments:  Abnormal  gait,Decreased range of motion,Difficulty walking,Impaired UE functional use,Decreased activity tolerance,Impaired perceived functional ability,Impaired vision/preception,Decreased balance,Decreased cognition,Decreased strength,Decreased mobility,Impaired sensation  Visit Diagnosis: Muscle weakness (generalized)  Abnormal posture     Problem List Patient Active Problem List   Diagnosis Date Noted  . Acute respiratory failure with hypoxia ([place]) 03/08/2015  . HCAP (healthcare-associated pneumonia) 03/08/2015  . Chest pain 03/08/2015  . Quadriparesis (muscle weakness)   . ESRD (end stage renal disease) on dialysis ([place]) 02/24/2015  . Constipation, acute 02/24/2015  . Normocytic anemia 02/24/2015  . Occult GI bleeding 02/24/2015  . Hypertension 02/24/2015  . Thrombocytopenia ([place]) 02/24/2015  . Hypothermia 02/24/2015  . Elevated lactic acid level 02/24/2015  . Hyperthyroidism 02/24/2015  . Anemia due to GI blood loss 02/24/2015  . Schizophrenia ([place]) 02/24/2015  . Constipation   . Chronic osteomyelitis ([place]) 06/26/2013    Electa Sniff, PT, DPT, NCS 06/08/2020, 3:53 PM  [facility] [address] [place], [place], [address] Phone: [phone]   Fax:  [phone]  Name: [name] MRN: [id number] Date of Birth: 1970/08/17

## 2020-06-15 ENCOUNTER — Other Ambulatory Visit: Payer: Self-pay

## 2020-06-15 ENCOUNTER — Ambulatory Visit: Payer: Medicaid Other | Admitting: Physical Therapy

## 2020-06-15 ENCOUNTER — Encounter: Payer: Self-pay | Admitting: Physical Therapy

## 2020-06-15 DIAGNOSIS — M6281 Muscle weakness (generalized): Secondary | ICD-10-CM | POA: Diagnosis not present

## 2020-06-15 DIAGNOSIS — R293 Abnormal posture: Secondary | ICD-10-CM

## 2020-06-15 NOTE — Therapy (Signed)
[facility] [address] [place], [place], [address] Phone: [phone]   Fax:  [phone]  Physical Therapy Treatment  Patient Details  Name: [name] MRN: [id number] Date of Birth: 1971/01/22 Referring Provider (PT): Lucianne Lei, MD   Encounter Date: 06/15/2020   PT End of Session - 06/15/20 1418    Visit Number 17    Number of Visits 19    Authorization Type Medicaid    Authorization Time Period 3 visits from 01/15/20 through 02/04/20; 3 visist from 02/17/20-03/08/20, 4 visits 2/8-3-7, 9 visits 3/15-4/25, 5/3-5/30 4 visits    Authorization - Visit Number 2    Authorization - Number of Visits 4    PT Start Time 1410   pt running late for appt   PT Stop Time 1445    PT Time Calculation (min) 35 min    Equipment Utilized During Treatment Gait belt    Activity Tolerance Patient tolerated treatment well;No increased pain;Patient limited by fatigue    Behavior During Therapy Flat affect;WFL for tasks assessed/performed           Past Medical History:  Diagnosis Date  . Acute respiratory failure with hypoxia ([place])   . Anemia   . Chest pain   . Constipation   . Depression   . Diabetes mellitus   . ESRD (end stage renal disease) on dialysis [facility])    "[place] off 68; MWF" (02/25/2015)  . HCAP (healthcare-associated pneumonia)   . Hypertension   . Hyperthyroidism   . Hyperthyroidism   . Quadriparesis (muscle weakness)   . Schizo-affective schizophrenia [facility])     Past Surgical History:  Procedure Laterality Date  . DIALYSIS FISTULA CREATION    . MULTIPLE EXTRACTIONS WITH ALVEOLOPLASTY  01/17/2011   Procedure: MULTIPLE EXTRACION WITH ALVEOLOPLASTY;  Surgeon: Gae Bon;  Location: [facility] OR;  Service: Oral Surgery;  Laterality: Bilateral;  Extraction of number one, sixteen, seventeen, and thirty two.     There were no vitals filed for this visit.   Subjective Assessment - 06/15/20 1414     Subjective Dialysis still has not corrected the wheelchair mix up. The head nurse at diaylsis has filed a grievence about his chair being switched/misplaced. His primary MD has also placed order for a new wheelchair. Have not taken the broken one to [facility] as yet.  No pain or falls to report.    Patient is accompained by: Family member   caregiver Collie Siad   Pertinent History PMH: ESRD, Type-II DM, HTN, legal blindness, schizophrenia, secondary hyperparathyroidism, and quadriparesis,    Limitations Sitting;Lifting;Standing;Walking;Writing;House hold activities    Patient Stated Goals Pt would like to be able to walk again. Caregiver would like him to be able to help more with activities.    Currently in Pain? No/denies                   [facility] Adult PT Treatment/Exercise - 06/15/20 1420      Transfers   Transfers Sit to Stand;Stand to Sit    Sit to Stand 3: Mod assist;From chair/3-in-1;With upper extremity assist    Sit to Stand Details Verbal cues for technique;Tactile cues for placement    Stand to Sit 4: Min assist;With upper extremity assist;To chair/3-in-1    Stand to Sit Details (indicate cue type and reason) Verbal cues for technique    Transfer Cueing attempted x2 in parallel bars with wheel blocked due to broken brake. . On 1st stand knees  buckling with PTA not in position to fully block them, therefore pt returned to wheelchair. Achieved standing on 2cd attempt with bil knees blocked. cues needed on posture, hip/knee extension with assist at pelvis by PTA and rehab tech min guard to min assist for trunk posture due to anterior lean at times. Pt able to stand ~1.5 minutes this rep.      Self-Care   Self-Care Other Self-Care Comments    Other Self-Care Comments  Reinforced the need to get brake fixed for safety with transfers. Pt's caregiver hoping to get new wheelchair. Discussed need for 5 years before insurance covers new wheelchair and need to fix this one until something can be  done with Dialysis switching the chairs (if something comes from this). Caregiver and pt verbalized understanding.      Knee/Hip Exercises: Aerobic   Other Aerobic Sci Fit level 2.5 x 34min with tactile cues at times to try to get more right hip abduction as tends to adduct and IR. PT also stabilizing w/c due to broken left brake.                    PT Short Term Goals - 04/06/20 1648      PT SHORT TERM GOAL #1   Title STGs=LTGs             PT Long Term Goals - 06/08/20 1549      PT LONG TERM GOAL #1   Title Pt will be able to complete all bed mobility with supervision for increased independence and decreased caregiver burden. ( LTGs due 06/29/20)    Baseline 04/06/20 supervision with supine to sit, min assist at legs with sit to supine; 05-25-20 -Goal not met as pt requires mod assist to transfer LE's onto mat from sitting to sidelying position    Time 4    Period Weeks    Status Not Met    Target Date 06/29/20      PT LONG TERM GOAL #2   Title Pt will be able to perform sit to stand transfer in // bars or at sink min assist for improved strength.    Baseline Performed in // bars mod assist for sit to stand pulling on bars and PT blocking legs 04/06/20. 05/18/20 Mod assist at // bars, max assist +2 at sink with knees blocked    Time 4    Period Weeks    Status Not Met      PT LONG TERM GOAL #3   Title Pt will be able to maintain standing in // bars x 3 min min assist for improved standing balance to assist with ADLs.    Baseline 04/06/20 1 min mod assist of 2 people.. 05/18/20 1 min 40 sec mod assist to block legs    Time 4    Period Weeks    Status Not Met      PT LONG TERM GOAL #4   Title Pt will be able to complete squat-pivot/lateral scoot transfers with supervision for increased independence and decreased caregiver burden.    Baseline 04/06/20 lateral scoot transfers supervision/CGA.; 05-25-20 - pt required min assist for w/c to mat and mod assist for mat to w/c toward  Lt side    Time 4    Period Weeks    Status Not Met      PT LONG TERM GOAL #5   Title Pt will be able to ambulate 8' in // bars mod assist.    Baseline  nonambulatory ; 05-25-20 - deferred - pt remains nonambulatory - had difficulty with standing with assistance on 05-25-20    Time 4    Period Weeks    Status Deferred                 Plan - 06/15/20 1418    Clinical Impression Statement Today's skilled session continued to focus on strengthening and standing tolerance/balance with assist/cues needed. Also continued to reinforce need to get wheelchair brake fixed for safety and fall prevention. The pt is making slow, steady progress toward goals and should benefit from continued PT to progress toward unmet goals.    Personal Factors and Comorbidities Comorbidity 3+;Past/Current Experience;Social Background;Time since onset of injury/illness/exacerbation    Comorbidities ESRD, Type-II DM, HTN, legal blindness, schizophrenia, secondary hyperparathyroidism, and quadriparesis    Stability/Clinical Decision Making Evolving/Moderate complexity    Rehab Potential Fair    PT Frequency 1x / week    PT Duration 4 weeks    PT Treatment/Interventions ADLs/Self Care Home Management;Gait training;Stair training;Functional mobility training;Therapeutic activities;Therapeutic exercise;Balance training;Neuromuscular re-education;DME Instruction;Cognitive remediation;Patient/family education;Wheelchair mobility training;Manual techniques;Visual/perceptual remediation/compensation;Orthotic Fit/Training    PT Next Visit Plan Did w/c brake get fixed? If so big focus will be on transfers, improving core stability and functional strengthening to try to be able to stand at sink.  Warm up on SciFit x 8 min. Continue with functional strengthening with transfers, sitting balance activities to engage core more as well as back extensors. Standing in // bars or at sink. May need second person to help get trunk upright  to start with PT blocking legs.    PT Home Exercise Plan Access Code: [email or web address]    Consulted and Agree with Plan of Care Other (Comment)    Family Member Consulted Caregiver, Manuela Schwartz           Patient will benefit from skilled therapeutic intervention in order to improve the following deficits and impairments:  Abnormal gait,Decreased range of motion,Difficulty walking,Impaired UE functional use,Decreased activity tolerance,Impaired perceived functional ability,Impaired vision/preception,Decreased balance,Decreased cognition,Decreased strength,Decreased mobility,Impaired sensation  Visit Diagnosis: Muscle weakness (generalized)  Abnormal posture     Problem List Patient Active Problem List   Diagnosis Date Noted  . Acute respiratory failure with hypoxia ([place]) 03/08/2015  . HCAP (healthcare-associated pneumonia) 03/08/2015  . Chest pain 03/08/2015  . Quadriparesis (muscle weakness)   . ESRD (end stage renal disease) on dialysis ([place]) 02/24/2015  . Constipation, acute 02/24/2015  . Normocytic anemia 02/24/2015  . Occult GI bleeding 02/24/2015  . Hypertension 02/24/2015  . Thrombocytopenia ([place]) 02/24/2015  . Hypothermia 02/24/2015  . Elevated lactic acid level 02/24/2015  . Hyperthyroidism 02/24/2015  . Anemia due to GI blood loss 02/24/2015  . Schizophrenia ([place]) 02/24/2015  . Constipation   . Chronic osteomyelitis ([place]) 06/26/2013    Willow Ora, PTA, [facility] [address] [place], [place] [address] [phone] 06/15/20, 11:18 PM   Name: [name] MRN: [id number] Date of Birth: 07/01/1970

## 2020-06-22 ENCOUNTER — Ambulatory Visit: Payer: Medicaid Other

## 2020-06-29 ENCOUNTER — Ambulatory Visit: Payer: Medicaid Other

## 2020-07-10 ENCOUNTER — Other Ambulatory Visit: Payer: Self-pay

## 2020-07-10 ENCOUNTER — Emergency Department (HOSPITAL_COMMUNITY)
Admission: EM | Admit: 2020-07-10 | Discharge: 2020-07-11 | Disposition: A | Discharge status: Home / Self Care | Payer: Medicaid Other | Attending: Emergency Medicine | Admitting: Emergency Medicine

## 2020-07-10 ENCOUNTER — Emergency Department (HOSPITAL_COMMUNITY): Payer: Medicaid Other

## 2020-07-10 ENCOUNTER — Encounter (HOSPITAL_COMMUNITY): Payer: Self-pay | Admitting: Emergency Medicine

## 2020-07-10 DIAGNOSIS — L97519 Non-pressure chronic ulcer of other part of right foot with unspecified severity: Secondary | ICD-10-CM

## 2020-07-10 DIAGNOSIS — Z79899 Other long term (current) drug therapy: Secondary | ICD-10-CM | POA: Diagnosis not present

## 2020-07-10 DIAGNOSIS — T8249XA Other complication of vascular dialysis catheter, initial encounter: Secondary | ICD-10-CM | POA: Insufficient documentation

## 2020-07-10 DIAGNOSIS — Z992 Dependence on renal dialysis: Secondary | ICD-10-CM | POA: Diagnosis not present

## 2020-07-10 DIAGNOSIS — E1122 Type 2 diabetes mellitus with diabetic chronic kidney disease: Secondary | ICD-10-CM | POA: Diagnosis not present

## 2020-07-10 DIAGNOSIS — Y733 Surgical instruments, materials and gastroenterology and urology devices (including sutures) associated with adverse incidents: Secondary | ICD-10-CM | POA: Diagnosis not present

## 2020-07-10 DIAGNOSIS — Z794 Long term (current) use of insulin: Secondary | ICD-10-CM | POA: Diagnosis not present

## 2020-07-10 DIAGNOSIS — N186 End stage renal disease: Secondary | ICD-10-CM | POA: Diagnosis not present

## 2020-07-10 DIAGNOSIS — Z20822 Contact with and (suspected) exposure to covid-19: Secondary | ICD-10-CM | POA: Insufficient documentation

## 2020-07-10 DIAGNOSIS — L97529 Non-pressure chronic ulcer of other part of left foot with unspecified severity: Secondary | ICD-10-CM

## 2020-07-10 DIAGNOSIS — L89519 Pressure ulcer of right ankle, unspecified stage: Secondary | ICD-10-CM | POA: Diagnosis present

## 2020-07-10 DIAGNOSIS — L89529 Pressure ulcer of left ankle, unspecified stage: Secondary | ICD-10-CM | POA: Insufficient documentation

## 2020-07-10 DIAGNOSIS — R079 Chest pain, unspecified: Secondary | ICD-10-CM

## 2020-07-10 DIAGNOSIS — I120 Hypertensive chronic kidney disease with stage 5 chronic kidney disease or end stage renal disease: Secondary | ICD-10-CM | POA: Insufficient documentation

## 2020-07-10 LAB — CBC WITH DIFFERENTIAL/PLATELET
Abs Immature Granulocytes: 0.02 10*3/uL (ref 0.00–0.07)
Basophils Absolute: 0 10*3/uL (ref 0.0–0.1)
Basophils Relative: 1 %
Eosinophils Absolute: 0.2 10*3/uL (ref 0.0–0.5)
Eosinophils Relative: 4 %
HCT: 37.2 % — ABNORMAL LOW (ref 39.0–52.0)
Hemoglobin: 11.4 g/dL — ABNORMAL LOW (ref 13.0–17.0)
Immature Granulocytes: 0 %
Lymphocytes Relative: 22 %
Lymphs Abs: 1.3 10*3/uL (ref 0.7–4.0)
MCH: 30.2 pg (ref 26.0–34.0)
MCHC: 30.6 g/dL (ref 30.0–36.0)
MCV: 98.4 fL (ref 80.0–100.0)
Monocytes Absolute: 0.8 10*3/uL (ref 0.1–1.0)
Monocytes Relative: 13 %
Neutro Abs: 3.7 10*3/uL (ref 1.7–7.7)
Neutrophils Relative %: 60 %
Platelets: 216 10*3/uL (ref 150–400)
RBC: 3.78 MIL/uL — ABNORMAL LOW (ref 4.22–5.81)
RDW: 13.7 % (ref 11.5–15.5)
WBC: 6.1 10*3/uL (ref 4.0–10.5)
nRBC: 0 % (ref 0.0–0.2)

## 2020-07-10 LAB — COMPREHENSIVE METABOLIC PANEL
ALT: 23 U/L (ref 0–44)
AST: 21 U/L (ref 15–41)
Albumin: 3.2 g/dL — ABNORMAL LOW (ref 3.5–5.0)
Alkaline Phosphatase: 148 U/L — ABNORMAL HIGH (ref 38–126)
Anion gap: 14 (ref 5–15)
BUN: 65 mg/dL — ABNORMAL HIGH (ref 6–20)
CO2: 25 mmol/L (ref 22–32)
Calcium: 9.6 mg/dL (ref 8.9–10.3)
Chloride: 101 mmol/L (ref 98–111)
Creatinine, Ser: 9.01 mg/dL — ABNORMAL HIGH (ref 0.61–1.24)
GFR, Estimated: 7 mL/min — ABNORMAL LOW (ref >60)
Glucose, Bld: 218 mg/dL — ABNORMAL HIGH (ref 70–99)
Potassium: 5.3 mmol/L — ABNORMAL HIGH (ref 3.5–5.1)
Sodium: 140 mmol/L (ref 135–145)
Total Bilirubin: 0.6 mg/dL (ref 0.3–1.2)
Total Protein: 8 g/dL (ref 6.5–8.1)

## 2020-07-10 LAB — RESP PANEL BY RT-PCR (FLU A&B, COVID) ARPGX2
Influenza A by PCR: NEGATIVE
Influenza B by PCR: NEGATIVE
SARS Coronavirus 2 by RT PCR: NEGATIVE

## 2020-07-10 NOTE — ED Triage Notes (Signed)
Pt reports he went to dialysis on Wed, missed yesterday however also missed make-up appointment due to caregiver "missing appointment."  Denies any SOB, pains or other symptoms.

## 2020-07-10 NOTE — ED Provider Notes (Addendum)
Emergency Medicine Provider Triage Evaluation Note  [name] , a 50 y.o. male  was evaluated in triage.  Pt complains of missed dialysis because of transportation issues. [facility] M[facility] [place]. Last session was wednesday (6/1). He denies being in any pain.   Review of Systems  Positive: none Negative: Shortness of breath, chest pain  Physical Exam  BP (!) 178/124 (BP Location: Left Arm)   Pulse 98   Temp 98.9 F (37.2 C) (Oral)   Resp 18   SpO2 100%  Gen:   Awake, no distress   Resp:  Normal effort  MSK:   Moves extremities without difficulty  Other:  RLE swelling baseline per patient  Medical Decision Making  Medically screening exam initiated at 8:20 PM.  Appropriate orders placed.  Bruce Donath was informed that the remainder of the evaluation will be completed by another provider, this initial triage assessment does not replace that evaluation, and the importance of remaining in the ED until their evaluation is complete.   Hypertensive in triage. Basic labs, chest xray, ekg and covid swab ordered incase he needs emergent dialysis.   Portions of this note were generated with Lobby[employer]. Dictation errors may occur despite best attempts at proofreading.     Barrie Folk, PA-C 07/10/20 2027    Barrie Folk, PA-C 07/10/20 2027    Valarie Merino, MD 07/11/20 [phone]

## 2020-07-11 ENCOUNTER — Other Ambulatory Visit: Payer: Self-pay

## 2020-07-11 MED ORDER — DOXYCYCLINE HYCLATE 100 MG PO TABS
100.0000 mg | ORAL_TABLET | Freq: Once | ORAL | Status: AC
  Administered 2020-07-11: 100 mg via ORAL
  Filled 2020-07-11: qty 1

## 2020-07-11 MED ORDER — COLLAGENASE 250 UNIT/GM EX OINT
TOPICAL_OINTMENT | Freq: Every day | CUTANEOUS | Status: DC
  Filled 2020-07-11 (×2): qty 30

## 2020-07-11 MED ORDER — AMLODIPINE BESYLATE 5 MG PO TABS
10.0000 mg | ORAL_TABLET | Freq: Once | ORAL | Status: AC
  Administered 2020-07-11: 10 mg via ORAL
  Filled 2020-07-11: qty 2

## 2020-07-11 MED ORDER — HYDRALAZINE HCL 25 MG PO TABS
100.0000 mg | ORAL_TABLET | Freq: Once | ORAL | Status: AC
  Administered 2020-07-11: 100 mg via ORAL
  Filled 2020-07-11: qty 4

## 2020-07-11 MED ORDER — DOXYCYCLINE HYCLATE 100 MG PO CAPS: 100.0000 mg | ORAL_CAPSULE | Freq: Two times a day (BID) | ORAL | 0 refills | Status: AC

## 2020-07-11 NOTE — ED Provider Notes (Signed)
[facility] EMERGENCY DEPARTMENT Provider Note   CSN: [id number] Arrival date & time: 07/10/20  1811     History Chief Complaint  Patient presents with  . Vascular Access Problem    [name] is a 50 y.o. male with a history of dialysis Monday Wednesday Friday presenting with his caretaker with concern for missed dialysis.  The patient has some cognitive deficits, his caretaker provides the majority of history.  She reports that he did miss his dialysis on Friday.  This was due to some personal problems you are having.  They called her dialysis center were told to come get checked out.  The patient himself denies any shortness of breath, chest pain.  He says he feels normal.  He has not taken any of his evening medications since coming to the emergency department yesterday evening.  Unfortunately he did have a prolonged stay in our waiting room due to very high ED volumes.  His next dialysis is due Monday morning.  His caretaker also notes that he has bilateral pressure ulcers on his heels.  He does see a podiatrist and has an upcoming appointment in 2 weeks, but she can call to get it moved up.  She was dressing it with Santyl until recently, but reports her insurance does not cover that.    HPI     Past Medical History:  Diagnosis Date  . Acute respiratory failure with hypoxia ([place])   . Anemia   . Chest pain   . Constipation   . Depression   . Diabetes mellitus   . ESRD (end stage renal disease) on dialysis [facility])    "[place] off 68; MWF" (02/25/2015)  . HCAP (healthcare-associated pneumonia)   . Hypertension   . Hyperthyroidism   . Hyperthyroidism   . Quadriparesis (muscle weakness)   . Schizo-affective schizophrenia [facility])     Patient Active Problem List   Diagnosis Date Noted  . Acute respiratory failure with hypoxia ([place]) 03/08/2015  . HCAP (healthcare-associated pneumonia) 03/08/2015  . Chest pain 03/08/2015  . Quadriparesis  (muscle weakness)   . ESRD (end stage renal disease) on dialysis ([place]) 02/24/2015  . Constipation, acute 02/24/2015  . Normocytic anemia 02/24/2015  . Occult GI bleeding 02/24/2015  . Hypertension 02/24/2015  . Thrombocytopenia ([place]) 02/24/2015  . Hypothermia 02/24/2015  . Elevated lactic acid level 02/24/2015  . Hyperthyroidism 02/24/2015  . Anemia due to GI blood loss 02/24/2015  . Schizophrenia ([place]) 02/24/2015  . Constipation   . Chronic osteomyelitis ([place]) 06/26/2013    Past Surgical History:  Procedure Laterality Date  . DIALYSIS FISTULA CREATION    . MULTIPLE EXTRACTIONS WITH ALVEOLOPLASTY  01/17/2011   Procedure: MULTIPLE EXTRACION WITH ALVEOLOPLASTY;  Surgeon: Gae Bon;  Location: [facility] OR;  Service: Oral Surgery;  Laterality: Bilateral;  Extraction of number one, sixteen, seventeen, and thirty two.        Family History  Family history unknown: Yes    Social History   Tobacco Use  . Smoking status: Never Smoker  Substance Use Topics  . Alcohol use: No  . Drug use: No    Home Medications Prior to Admission medications   Medication Sig Start Date End Date Taking? Authorizing Provider  doxycycline (VIBRAMYCIN) 100 MG capsule Take 1 capsule (100 mg total) by mouth 2 (two) times daily for 10 days. 07/11/20 07/21/20 Yes Staley Budzinski, Carola Rhine, MD  amLODipine (NORVASC) 10 MG tablet Take 1 tablet (10 mg total) by mouth at  bedtime. 02/27/15   Kinnie Feil, MD  bisacodyl (DULCOLAX) 10 MG suppository Place 1 suppository (10 mg total) rectally daily as needed for moderate constipation. Patient not taking: Reported on 01/08/2020 02/27/15   Kinnie Feil, MD  cefTAZidime 2 g in dextrose 5 % 50 mL Inject 2 g into the vein once. LAST dose on HD 03/12/15 03/11/15   Tat, Shanon Brow, MD  cinacalcet (SENSIPAR) 30 MG tablet Take 30 mg by mouth See admin instructions. Tuesday, Thursday & Saturday    [provider]  cloNIDine (CATAPRES) 0.1 MG tablet Take 1 tablet (0.1 mg total)  by mouth 3 (three) times daily. 02/27/15   Kinnie Feil, MD  hydrALAZINE (APRESOLINE) 100 MG tablet Take 1 tablet (100 mg total) by mouth 3 (three) times daily. 02/27/15   Kinnie Feil, MD  insulin glargine (LANTUS) 100 UNIT/ML injection Inject 0.12 mLs (12 Units total) into the skin daily. 03/11/15   Orson Eva, MD  insulin lispro (HUMALOG) 100 UNIT/ML injection Inject 5 Units into the skin 3 (three) times daily before meals. Inject additional units for blood sugar levels that are 301-350=6 units, 351-400=8 units.    [provider]  labetalol (NORMODYNE) 100 MG tablet Take 1 tablet (100 mg total) by mouth 2 (two) times daily. 02/27/15   Kinnie Feil, MD  LORazepam (ATIVAN) 1 MG tablet Take 1 mg by mouth 3 (three) times daily. For anxiety     [provider]  multivitamin (RENA-VIT) TABS tablet Take 1 tablet by mouth daily.      [provider]  nitroGLYCERIN (NITROSTAT) 0.4 MG SL tablet Place 0.4 mg under the tongue every 5 (five) minutes as needed. For chest pain  Patient not taking: Reported on 01/08/2020    [provider]  ofloxacin (FLOXIN) 0.3 % OTIC solution Place 3 drops into the left ear 2 (two) times daily. For up to one week. Patient not taking: Reported on 01/23/2019 02/27/18   Vanessa Kick, MD  pantoprazole (PROTONIX) 40 MG tablet Take 40 mg by mouth daily.      [provider]  prednisoLONE acetate (PRED FORTE) 1 % ophthalmic suspension Place 1 drop into the left eye 2 (two) times daily as needed (inflammation).  Patient not taking: Reported on 01/08/2020 10/20/14   [provider]  risperiDONE (RISPERDAL) 2 MG tablet Take 2 mg by mouth 2 (two) times daily.      [provider]  senna-docusate (SENOKOT-S) 8.6-50 MG tablet Take 1 tablet by mouth 2 (two) times daily. 02/27/15   Kinnie Feil, MD  sevelamer (RENVELA) 800 MG tablet Take 800 mg by mouth 3 (three) times daily.      [provider]   simvastatin (ZOCOR) 20 MG tablet Take 20 mg by mouth at bedtime.      [provider]  temazepam (RESTORIL) 15 MG capsule Take 15 mg by mouth at bedtime.      [provider]  Vancomycin (VANCOCIN) 750 MG/150ML SOLN Inject 150 mLs (750 mg total) into the vein every Monday, Wednesday, and Friday with hemodialysis. LAST dose on HD 03/12/15 03/11/15   TatShanon Brow, MD    Allergies    Lactose intolerance (gi), Polymyxin b, and Sulfamethoxazole  Review of Systems   Review of Systems  Constitutional: Negative for chills and fever.  HENT: Negative for ear pain and sore throat.   Eyes: Negative for pain and visual disturbance.  Respiratory: Negative for cough and shortness of breath.  Cardiovascular: Negative for chest pain and palpitations.  Gastrointestinal: Negative for abdominal pain and vomiting.  Musculoskeletal: Negative for arthralgias and back pain.  Skin: Positive for rash and wound.  Neurological: Negative for seizures and syncope.  All other systems reviewed and are negative.   Physical Exam Updated Vital Signs BP (!) 215/107   Pulse 88   Temp 97.9 F (36.6 C) (Oral)   Resp (!) 0   Ht '6\' 1"'$  (1.854 m)   Wt 117 kg   SpO2 100%   BMI 34.04 kg/m   Physical Exam Constitutional:      General: He is not in acute distress. HENT:     Head: Normocephalic and atraumatic.  Eyes:     Conjunctiva/sclera: Conjunctivae normal.     Pupils: Pupils are equal, round, and reactive to light.  Cardiovascular:     Rate and Rhythm: Normal rate and regular rhythm.     Pulses: Normal pulses.  Pulmonary:     Effort: Pulmonary effort is normal. No respiratory distress.  Abdominal:     General: There is no distension.     Tenderness: There is no abdominal tenderness.  Skin:    General: Skin is warm and dry.     Comments: Pressure ulcers of bilateral heels   Neurological:     General: No focal deficit present.     Mental Status: He is alert. Mental status is at baseline.   Psychiatric:        Mood and Affect: Mood normal.        Behavior: Behavior normal.     ED Results / Procedures / Treatments   Labs (all labs ordered are listed, but only abnormal results are displayed) Labs Reviewed  COMPREHENSIVE METABOLIC PANEL - Abnormal; Notable for the following components:      Result Value   Potassium 5.3 (*)    Glucose, Bld 218 (*)    BUN 65 (*)    Creatinine, Ser 9.01 (*)    Albumin 3.2 (*)    Alkaline Phosphatase 148 (*)    GFR, Estimated 7 (*)    All other components within normal limits  CBC WITH DIFFERENTIAL/PLATELET - Abnormal; Notable for the following components:   RBC 3.78 (*)    Hemoglobin 11.4 (*)    HCT 37.2 (*)    All other components within normal limits  RESP PANEL BY RT-PCR (FLU A&B, COVID) [id number]    EKG EKG Interpretation  Date/Time:  Sunday July 11 2020 02:22:43 EDT Ventricular Rate:  89 PR Interval:  202 QRS Duration: 85 QT Interval:  378 QTC Calculation: 460 R Axis:   66 Text Interpretation: Sinus rhythm Borderline prolonged PR interval Consider left atrial enlargement Confirmed by Octaviano Glow [phone]) on 07/11/2020 1:24:24 PM   Radiology DG Chest 2 View  Result Date: 07/10/2020 CLINICAL DATA:  Missed dialysis with hypoxia. EXAM: CHEST - 2 VIEW COMPARISON:  March 29, 2020 FINDINGS: A trace amount of atelectasis is seen within the bilateral lung bases. There is no evidence of acute infiltrate, pleural effusion or pneumothorax. The heart size and mediastinal contours are within normal limits. The visualized skeletal structures are unremarkable. IMPRESSION: No active cardiopulmonary disease. Electronically Signed   By: Virgina Norfolk M.D.   On: 07/10/2020 21:34    Procedures Procedures   Medications Ordered in ED Medications  doxycycline (VIBRA-TABS) tablet 100 mg (100 mg Oral Given 07/11/20 0337)  amLODipine (NORVASC) tablet 10 mg (10 mg Oral Given 07/11/20 0337)  hydrALAZINE (APRESOLINE) tablet 100  mg (100 mg Oral  Given 07/11/20 [id number])    ED Course  I have reviewed the triage vital signs and the nursing notes.  Pertinent labs & imaging results that were available during my care of the patient were reviewed by me and considered in my medical decision making (see chart for details).  50 yo male here with missed dialysis No respiratory distress or hypoxia, he feels normal Labs reviewed -very mild hyperK.   I do not see an indication for emergent dialysis I advised his caretaker to take him to his next scheduled session on Monday  He does NOT have chest pain or pressure on my exam.  He has pressure ulcers of the heels.  We can dress these today.  I will start him on doxycycline.  I discussed with his caregiver how to prop up his legs to relieve pressure on his heels.  He has a podiatrist already - appointment in 2 weeks, but she will call to try to move it up.  At this time I doubt sepsis/osteomyelitis.  WBC 6.1 and reassuring.  ECG reviewed - no acute ischemic changes, no findings of hyperkalemia DG chest reviewed - no significant pulmonary edema  PO blood pressure medications ordered as he has not taken evening meds - amlodipine and hydralazine.     Final Clinical Impression(s) / ED Diagnoses Final diagnoses:  Ulcer of both feet, unspecified ulcer stage ([place])    Rx / DC Orders ED Discharge Orders         Ordered    doxycycline (VIBRAMYCIN) 100 MG capsule  2 times daily        07/11/20 0328           Wyvonnia Dusky, MD 07/11/20 1324

## 2020-07-11 NOTE — ED Notes (Signed)
Provider at bedside

## 2020-07-11 NOTE — Discharge Instructions (Addendum)
[name] can go to his Monday dialysis as normally scheduled.  I started him on antibiotics for his foot wound.  Please call his foot doctor to arrange for closer follow up.

## 2021-03-25 ENCOUNTER — Emergency Department (HOSPITAL_COMMUNITY)
Admission: EM | Admit: 2021-03-25 | Discharge: 2021-03-25 | Disposition: A | Discharge status: Home / Self Care | Payer: Medicaid Other | Attending: Emergency Medicine | Admitting: Emergency Medicine

## 2021-03-25 ENCOUNTER — Other Ambulatory Visit: Payer: Self-pay

## 2021-03-25 ENCOUNTER — Encounter (HOSPITAL_COMMUNITY): Payer: Self-pay | Admitting: Emergency Medicine

## 2021-03-25 ENCOUNTER — Emergency Department (HOSPITAL_COMMUNITY): Payer: Medicaid Other

## 2021-03-25 DIAGNOSIS — Z20822 Contact with and (suspected) exposure to covid-19: Secondary | ICD-10-CM | POA: Insufficient documentation

## 2021-03-25 DIAGNOSIS — D509 Iron deficiency anemia, unspecified: Secondary | ICD-10-CM | POA: Diagnosis not present

## 2021-03-25 DIAGNOSIS — D539 Nutritional anemia, unspecified: Secondary | ICD-10-CM

## 2021-03-25 DIAGNOSIS — R410 Disorientation, unspecified: Secondary | ICD-10-CM | POA: Insufficient documentation

## 2021-03-25 DIAGNOSIS — N186 End stage renal disease: Secondary | ICD-10-CM | POA: Diagnosis not present

## 2021-03-25 DIAGNOSIS — Z992 Dependence on renal dialysis: Secondary | ICD-10-CM | POA: Insufficient documentation

## 2021-03-25 DIAGNOSIS — E1122 Type 2 diabetes mellitus with diabetic chronic kidney disease: Secondary | ICD-10-CM | POA: Insufficient documentation

## 2021-03-25 DIAGNOSIS — R4182 Altered mental status, unspecified: Secondary | ICD-10-CM | POA: Diagnosis present

## 2021-03-25 DIAGNOSIS — Z794 Long term (current) use of insulin: Secondary | ICD-10-CM | POA: Insufficient documentation

## 2021-03-25 LAB — BASIC METABOLIC PANEL
Anion gap: 12 (ref 5–15)
BUN: 30 mg/dL — ABNORMAL HIGH (ref 6–20)
CO2: 29 mmol/L (ref 22–32)
Calcium: 8.7 mg/dL — ABNORMAL LOW (ref 8.9–10.3)
Chloride: 96 mmol/L — ABNORMAL LOW (ref 98–111)
Creatinine, Ser: 3.67 mg/dL — ABNORMAL HIGH (ref 0.61–1.24)
GFR, Estimated: 19 mL/min — ABNORMAL LOW (ref >60)
Glucose, Bld: 94 mg/dL (ref 70–99)
Potassium: 3.3 mmol/L — ABNORMAL LOW (ref 3.5–5.1)
Sodium: 137 mmol/L (ref 135–145)

## 2021-03-25 LAB — CBC WITH DIFFERENTIAL/PLATELET
Abs Immature Granulocytes: 0.03 10*3/uL (ref 0.00–0.07)
Basophils Absolute: 0 10*3/uL (ref 0.0–0.1)
Basophils Relative: 1 %
Eosinophils Absolute: 0.4 10*3/uL (ref 0.0–0.5)
Eosinophils Relative: 6 %
HCT: 27.3 % — ABNORMAL LOW (ref 39.0–52.0)
Hemoglobin: 8 g/dL — ABNORMAL LOW (ref 13.0–17.0)
Immature Granulocytes: 1 %
Lymphocytes Relative: 33 %
Lymphs Abs: 1.9 10*3/uL (ref 0.7–4.0)
MCH: 29.7 pg (ref 26.0–34.0)
MCHC: 29.3 g/dL — ABNORMAL LOW (ref 30.0–36.0)
MCV: 101.5 fL — ABNORMAL HIGH (ref 80.0–100.0)
Monocytes Absolute: 0.8 10*3/uL (ref 0.1–1.0)
Monocytes Relative: 13 %
Neutro Abs: 2.7 10*3/uL (ref 1.7–7.7)
Neutrophils Relative %: 46 %
Platelets: 226 10*3/uL (ref 150–400)
RBC: 2.69 MIL/uL — ABNORMAL LOW (ref 4.22–5.81)
RDW: 15.3 % (ref 11.5–15.5)
WBC: 5.8 10*3/uL (ref 4.0–10.5)
nRBC: 0 % (ref 0.0–0.2)

## 2021-03-25 LAB — RESP PANEL BY RT-PCR (FLU A&B, COVID) ARPGX2
Influenza A by PCR: NEGATIVE
Influenza B by PCR: NEGATIVE
SARS Coronavirus 2 by RT PCR: NEGATIVE

## 2021-03-25 LAB — POC OCCULT BLOOD, ED: Fecal Occult Bld: NEGATIVE

## 2021-03-25 MED ORDER — LORAZEPAM 0.5 MG PO TABS
0.5000 mg | ORAL_TABLET | Freq: Once | ORAL | Status: AC
  Administered 2021-03-25: 0.5 mg via ORAL
  Filled 2021-03-25: qty 1

## 2021-03-25 NOTE — ED Notes (Addendum)
Gave pt graham crackers, water, and pineapple slices.I fed the pt pineapple slices and diet ginger ale. Pt is asking for two sandwhichs and plain chips. Nurse notified.

## 2021-03-25 NOTE — Discharge Instructions (Addendum)
Lab work imaging all reassuring please continue all home medications.  Hemoglobin was slightly low here today please follow-up with your PCP for repeat CBC within a week's time.  Come back to the emergency department if you develop chest pain, shortness of breath, severe abdominal pain, uncontrolled nausea, vomiting, diarrhea.
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
## 2021-03-25 NOTE — ED Provider Notes (Signed)
[facility] EMERGENCY DEPARTMENT Provider Note   CSN: [id number] Arrival date & time: 03/25/21  1205     History  Chief Complaint  Patient presents with   Altered Mental Status    [name] is a 51 y.o. male.  HPI  Patient with medical history including diabetes, end-stage renal disease on dialysis Monday Wednesday Friday DVT, bilateral blindness schizophrenia presents the emergency department from the [facility] due to altered mental status.  Patient states that he is unsure why he is even here, he has no complaints, he does not endorse fevers, chills, nasal ingestion, sore throat, cough, general body aches, denies any stomach pains, states he has had some diarrhea and a slight cough but this 1 for a few months, he states he still tolerating p.o., he denies any recent head trauma.   Spoke with nursing staff from the [facility], they state that he woke up normal this morning but while he was eating he appeared to be somnolent and sleepy, with abnormal breathing, this had since resolved once EMS arrived, they state he has some difficulty answering some questions.  He has had no fevers or chills, no URI-like symptoms, still tolerating p.o., has no other complaints.  Home Medications Prior to Admission medications   Medication Sig Start Date End Date Taking? Authorizing Provider  ascorbic acid (VITAMIN C) 500 MG tablet Take 1 tablet by mouth 2 (two) times daily.   Yes [provider]  atorvastatin (LIPITOR) 10 MG tablet Take 10 mg by mouth daily. 11/10/20  Yes [provider]  bisacodyl (DULCOLAX) 5 MG EC tablet Take 10 mg by mouth daily. 09/02/20  Yes [provider]  carvedilol (COREG) 6.25 MG tablet Take 6.25 mg by mouth daily.   Yes [provider]  cinacalcet (SENSIPAR) 30 MG tablet Take 30 mg by mouth daily with breakfast.   Yes [provider]  docusate sodium (COLACE) 100 MG capsule Take 100 mg by mouth 2 (two)  times daily.   Yes [provider]  ferrous sulfate 325 (65 FE) MG tablet Take 1 tablet by mouth daily with breakfast. 11/15/20  Yes [provider]  insulin glargine (LANTUS) 100 UNIT/ML injection Inject 0.12 mLs (12 Units total) into the skin daily. Patient taking differently: Inject 8 Units into the skin daily. 03/11/15  Yes Tat, Shanon Brow, MD  insulin lispro (HUMALOG) 100 UNIT/ML injection Inject 0-8 Units into the skin 3 (three) times daily before meals. 1-8 units 3 times daily with meals. Subcutaneous administration. If blood glucose less than 179, no lispro needed. If blood glucose 180-200, give 1 unit. If blood glucose 201-250, give 3 units. If blood glucose 251-300, give 5 units. If blood glucose 301-350, give 6 units. If blood glucose more than 351, give 8 units.   Yes [provider]  LORazepam (ATIVAN) 1 MG tablet Take 1 mg by mouth 3 (three) times daily. For anxiety    Yes [provider]  multivitamin (RENA-VIT) TABS tablet Take 1 tablet by mouth daily.     Yes [provider]  nitroGLYCERIN (NITROSTAT) 0.4 MG SL tablet Place 0.4 mg under the tongue every 5 (five) minutes as needed. For chest pain   Yes [provider]  ondansetron (ZOFRAN-ODT) 4 MG disintegrating tablet Take 4 mg by mouth every 6 (six) hours as needed for nausea or vomiting.   Yes [provider]  pantoprazole (PROTONIX) 40 MG tablet Take 40 mg by mouth daily.  Yes [provider]  polyethylene glycol (MIRALAX / GLYCOLAX) 17 g packet Take 17 g by mouth daily.   Yes [provider]  risperiDONE (RISPERDAL) 2 MG tablet Take 2 mg by mouth 2 (two) times daily.     Yes [provider]  sevelamer (RENVELA) 800 MG tablet Take 800 mg by mouth 3 (three) times daily.     Yes [provider]  sodium zirconium cyclosilicate (LOKELMA) 10 g PACK packet Take 10 g by mouth daily.   Yes [provider]  amLODipine (NORVASC) 10 MG  tablet Take 1 tablet (10 mg total) by mouth at bedtime. Patient not taking: Reported on 03/25/2021 02/27/15   Kinnie Feil, MD  bisacodyl (DULCOLAX) 10 MG suppository Place 1 suppository (10 mg total) rectally daily as needed for moderate constipation. Patient not taking: Reported on 03/25/2021 02/27/15   Kinnie Feil, MD  cefTAZidime 2 g in dextrose 5 % 50 mL Inject 2 g into the vein once. LAST dose on HD 03/12/15 Patient not taking: Reported on 03/25/2021 03/11/15   TatShanon Brow, MD  cloNIDine (CATAPRES) 0.1 MG tablet Take 1 tablet (0.1 mg total) by mouth 3 (three) times daily. Patient not taking: Reported on 03/25/2021 02/27/15   Kinnie Feil, MD  hydrALAZINE (APRESOLINE) 100 MG tablet Take 1 tablet (100 mg total) by mouth 3 (three) times daily. Patient not taking: Reported on 03/25/2021 02/27/15   Kinnie Feil, MD  labetalol (NORMODYNE) 100 MG tablet Take 1 tablet (100 mg total) by mouth 2 (two) times daily. Patient not taking: Reported on 03/25/2021 02/27/15   Kinnie Feil, MD  ofloxacin (FLOXIN) 0.3 % OTIC solution Place 3 drops into the left ear 2 (two) times daily. For up to one week. Patient not taking: Reported on 01/23/2019 02/27/18   Vanessa Kick, MD  prednisoLONE acetate (PRED FORTE) 1 % ophthalmic suspension Place 1 drop into the left eye 2 (two) times daily as needed (inflammation).  Patient not taking: Reported on 01/08/2020 10/20/14   [provider]  senna-docusate (SENOKOT-S) 8.6-50 MG tablet Take 1 tablet by mouth 2 (two) times daily. Patient not taking: Reported on 03/25/2021 02/27/15   Kinnie Feil, MD  Vancomycin (VANCOCIN) 750 MG/150ML SOLN Inject 150 mLs (750 mg total) into the vein every Monday, Wednesday, and Friday with hemodialysis. LAST dose on HD 03/12/15 Patient not taking: Reported on 03/25/2021 03/11/15   TatShanon Brow, MD      Allergies    Lactose intolerance (gi), Polymyxin b, and Sulfamethoxazole    Review of Systems   Review of Systems   Constitutional:  Negative for chills and fever.  Respiratory:  Negative for shortness of breath.   Cardiovascular:  Negative for chest pain.  Gastrointestinal:  Negative for abdominal pain.  Neurological:  Negative for headaches.   Physical Exam Updated Vital Signs BP 128/68 (BP Location: Left Arm)    Pulse 88    Temp 98.1 F (36.7 C) (Oral)    Resp 18    Ht 6\' 1"  (1.854 m)    Wt 117 kg    SpO2 100%    BMI 34.03 kg/m  Physical Exam Vitals and nursing note reviewed.  Constitutional:      General: He is not in acute distress.    Appearance: He is not ill-appearing.  HENT:     Head: Normocephalic and atraumatic.     Comments: No raccoon eyes or battle sign noted my exam.    Nose: No  congestion.     Mouth/Throat:     Mouth: Mucous membranes are moist.     Pharynx: Oropharynx is clear. No oropharyngeal exudate or posterior oropharyngeal erythema.     Comments: No trismus, no torticollis, no oral trauma present Eyes:     Conjunctiva/sclera: Conjunctivae normal.  Cardiovascular:     Rate and Rhythm: Normal rate and regular rhythm.     Pulses: Normal pulses.     Heart sounds: No murmur heard.   No friction rub. No gallop.  Pulmonary:     Effort: No respiratory distress.     Breath sounds: No wheezing, rhonchi or rales.  Musculoskeletal:     Comments: Lower extremities were flaccid, had noted skin breakdown in the right heel, stage II pressure ulcer, no signs of infection no drainage or discharge noted.  Neurovascularly intact in the upper and lower extremities.  Upper extremities has 5 5 strength neurovascular intact  Skin:    General: Skin is warm and dry.     Comments: Patient has fistula on the left arm, no signs of infection good palpable thrill.  Neurological:     Mental Status: He is alert.     Comments: Cranial nerves II through XII grossly intact no difficult word finding, a follow two-step commands, no weakness present.  ANO x4.  Psychiatric:        Mood and Affect: Mood  normal.    ED Results / Procedures / Treatments   Labs (all labs ordered are listed, but only abnormal results are displayed) Labs Reviewed  BASIC METABOLIC PANEL - Abnormal; Notable for the following components:      Result Value   Potassium 3.3 (*)    Chloride 96 (*)    BUN 30 (*)    Creatinine, Ser 3.67 (*)    Calcium 8.7 (*)    GFR, Estimated 19 (*)    All other components within normal limits  CBC WITH DIFFERENTIAL/PLATELET - Abnormal; Notable for the following components:   RBC 2.69 (*)    Hemoglobin 8.0 (*)    HCT 27.3 (*)    MCV 101.5 (*)    MCHC 29.3 (*)    All other components within normal limits  RESP PANEL BY RT-PCR (FLU A&B, COVID) [id number]  URINALYSIS, ROUTINE W REFLEX MICROSCOPIC  POC OCCULT BLOOD, ED    EKG None  Radiology DG Chest Port 1 View  Result Date: 03/25/2021 CLINICAL DATA:  Chest pain, cough. EXAM: PORTABLE CHEST 1 VIEW COMPARISON:  December 08, 2020. FINDINGS: Stable cardiomediastinal silhouette. Loculated right pleural effusion is noted which is decreased compared to prior exam. Right basilar atelectasis is noted. Right internal jugular dialysis catheter is unchanged. Bony thorax is unremarkable. IMPRESSION: Loculated right pleural effusion is noted which is decreased in size compared to prior exam. Associated right basilar atelectasis is noted. Electronically Signed   By: Marijo Conception M.D.   On: 03/25/2021 13:41    Procedures Procedures    Medications Ordered in ED Medications - No data to display  ED Course/ Medical Decision Making/ A&P                           Medical Decision Making Amount and/or Complexity of Data Reviewed Labs: ordered. Radiology: ordered.   This patient presents to the ED for concern of altered mental status, this involves an extensive number of treatment options, and is a complaint that carries with it a high risk of complications  and morbidity.  The differential diagnosis includes metabolic, infectious,  CVA    Additional history obtained:  Additional history obtained from nursing staff External records from outside source obtained and reviewed including please see note for full detail   Co morbidities that complicate the patient evaluation  End-stage renal disease, quadriplegic  Social Determinants of Health:  Quadriplegic    Lab Tests:  I Ordered, and personally interpreted labs.  The pertinent results include: CBC shows macrocytic anemia hemoglobin of 8, baseline appears to be between 8 and 10, BMP shows potassium 3.3 BUN 30 creatinine 3.67 calcium 8.7 GFR 19 Hemoccult negative, respiratory panel negative   Imaging Studies ordered:  I ordered imaging studies including chest x-ray I independently visualized and interpreted imaging which showed negative for acute findings I agree with the radiologist interpretation    Reevaluation:  It was noted that patient had a slight decrease in his hemoglobin, from [facility] hemoglobin on 01/27 was 8.9, it is now 0.8, likely this is secondary due to chronic diseases but will obtain Hemoccult for further evaluation  Hemoccult was negative, he is tolerating p.o., requesting a sandwich, he has no other complaints vital signs remained stable he is agreeable for discharge.     Test Considered:  CT head but will defer as there is no new neurodeficits, patient is not altered my exam, he is not on anticoag, very low suspicion for CVA and/or intracranial head bleed.    Rule out Low suspicion for systemic infection patient is nontoxic-appearing vital signs reassuring does not meet sepsis or SIRS criteria.  Low suspicion for metabolic abnormality as lab work is unremarkable at his baseline.  I have low suspicion for URI as lung sounds are clear bilaterally, chest x-ray is negative for any acute findings.  Low suspicion for emergent hemodialysis as he has no new oxygen requirements, no significant electrolyte derailments.  UA was  deferred as patient does not make urine.  Low suspicion for GI bleed he denies any abdominal tenderness, not on anticoag, has no history of this, Hemoccult is negative.    Dispostion and problem list  After consideration of the diagnostic results and the patients response to treatment, I feel that the patent would benefit from discharge.  Altered mental status since resolved-unclear etiology, will continue with home medications, follow-up PCP as needed.  Given strict return precautions. Macrocytic anemia-likely secondary due to chronic diseases, mom follow-up with PCP for repeat CBC for reevaluation.            Final Clinical Impression(s) / ED Diagnoses Final diagnoses:  Confusion  Macrocytic anemia    Rx / DC Orders ED Discharge Orders     None         Aron Baba 03/25/21 1536    Luna Fuse, MD 04/03/21 1258

## 2021-08-15 ENCOUNTER — Encounter (HOSPITAL_COMMUNITY): Payer: Self-pay | Admitting: Emergency Medicine

## 2021-08-15 ENCOUNTER — Other Ambulatory Visit: Payer: Self-pay

## 2021-08-15 ENCOUNTER — Inpatient Hospital Stay (HOSPITAL_COMMUNITY)
Admission: EM | Admit: 2021-08-15 | Discharge: 2021-08-19 | DRG: 871 | Disposition: A | Discharge status: Other Acute Inpatient Hospital | Payer: Medicaid Other | Source: Skilled Nursing Facility | Attending: Family Medicine | Admitting: Family Medicine

## 2021-08-15 ENCOUNTER — Emergency Department (HOSPITAL_COMMUNITY): Payer: Medicaid Other

## 2021-08-15 DIAGNOSIS — Z91011 Allergy to milk products: Secondary | ICD-10-CM

## 2021-08-15 DIAGNOSIS — H547 Unspecified visual loss: Secondary | ICD-10-CM | POA: Diagnosis present

## 2021-08-15 DIAGNOSIS — E039 Hypothyroidism, unspecified: Secondary | ICD-10-CM | POA: Diagnosis present

## 2021-08-15 DIAGNOSIS — E1165 Type 2 diabetes mellitus with hyperglycemia: Secondary | ICD-10-CM | POA: Diagnosis present

## 2021-08-15 DIAGNOSIS — R6520 Severe sepsis without septic shock: Secondary | ICD-10-CM | POA: Diagnosis present

## 2021-08-15 DIAGNOSIS — N2581 Secondary hyperparathyroidism of renal origin: Secondary | ICD-10-CM | POA: Diagnosis present

## 2021-08-15 DIAGNOSIS — G8250 Quadriplegia, unspecified: Secondary | ICD-10-CM | POA: Diagnosis present

## 2021-08-15 DIAGNOSIS — Z20822 Contact with and (suspected) exposure to covid-19: Secondary | ICD-10-CM | POA: Diagnosis present

## 2021-08-15 DIAGNOSIS — Z86718 Personal history of other venous thrombosis and embolism: Secondary | ICD-10-CM

## 2021-08-15 DIAGNOSIS — K529 Noninfective gastroenteritis and colitis, unspecified: Secondary | ICD-10-CM | POA: Diagnosis present

## 2021-08-15 DIAGNOSIS — E785 Hyperlipidemia, unspecified: Secondary | ICD-10-CM | POA: Diagnosis present

## 2021-08-15 DIAGNOSIS — A419 Sepsis, unspecified organism: Principal | ICD-10-CM | POA: Diagnosis present

## 2021-08-15 DIAGNOSIS — I120 Hypertensive chronic kidney disease with stage 5 chronic kidney disease or end stage renal disease: Secondary | ICD-10-CM | POA: Diagnosis present

## 2021-08-15 DIAGNOSIS — K5900 Constipation, unspecified: Secondary | ICD-10-CM | POA: Diagnosis present

## 2021-08-15 DIAGNOSIS — Z7401 Bed confinement status: Secondary | ICD-10-CM

## 2021-08-15 DIAGNOSIS — F259 Schizoaffective disorder, unspecified: Secondary | ICD-10-CM | POA: Diagnosis present

## 2021-08-15 DIAGNOSIS — N186 End stage renal disease: Secondary | ICD-10-CM | POA: Diagnosis present

## 2021-08-15 DIAGNOSIS — D631 Anemia in chronic kidney disease: Secondary | ICD-10-CM | POA: Diagnosis present

## 2021-08-15 DIAGNOSIS — Z87820 Personal history of traumatic brain injury: Secondary | ICD-10-CM

## 2021-08-15 DIAGNOSIS — E1122 Type 2 diabetes mellitus with diabetic chronic kidney disease: Secondary | ICD-10-CM | POA: Diagnosis present

## 2021-08-15 DIAGNOSIS — Z992 Dependence on renal dialysis: Secondary | ICD-10-CM

## 2021-08-15 DIAGNOSIS — E872 Acidosis, unspecified: Secondary | ICD-10-CM | POA: Diagnosis present

## 2021-08-15 DIAGNOSIS — M898X9 Other specified disorders of bone, unspecified site: Secondary | ICD-10-CM | POA: Diagnosis present

## 2021-08-15 DIAGNOSIS — I10 Essential (primary) hypertension: Secondary | ICD-10-CM | POA: Diagnosis present

## 2021-08-15 DIAGNOSIS — E669 Obesity, unspecified: Secondary | ICD-10-CM | POA: Diagnosis present

## 2021-08-15 DIAGNOSIS — N390 Urinary tract infection, site not specified: Secondary | ICD-10-CM

## 2021-08-15 DIAGNOSIS — D638 Anemia in other chronic diseases classified elsewhere: Secondary | ICD-10-CM | POA: Diagnosis present

## 2021-08-15 DIAGNOSIS — Z888 Allergy status to other drugs, medicaments and biological substances status: Secondary | ICD-10-CM

## 2021-08-15 DIAGNOSIS — K219 Gastro-esophageal reflux disease without esophagitis: Secondary | ICD-10-CM | POA: Diagnosis present

## 2021-08-15 DIAGNOSIS — E1169 Type 2 diabetes mellitus with other specified complication: Secondary | ICD-10-CM

## 2021-08-15 DIAGNOSIS — N3090 Cystitis, unspecified without hematuria: Secondary | ICD-10-CM | POA: Diagnosis present

## 2021-08-15 DIAGNOSIS — J90 Pleural effusion, not elsewhere classified: Secondary | ICD-10-CM | POA: Diagnosis present

## 2021-08-15 DIAGNOSIS — Z794 Long term (current) use of insulin: Secondary | ICD-10-CM

## 2021-08-15 DIAGNOSIS — Z882 Allergy status to sulfonamides status: Secondary | ICD-10-CM

## 2021-08-15 DIAGNOSIS — Z792 Long term (current) use of antibiotics: Secondary | ICD-10-CM

## 2021-08-15 DIAGNOSIS — N281 Cyst of kidney, acquired: Secondary | ICD-10-CM | POA: Diagnosis present

## 2021-08-15 DIAGNOSIS — K6289 Other specified diseases of anus and rectum: Secondary | ICD-10-CM | POA: Diagnosis present

## 2021-08-15 DIAGNOSIS — Z79899 Other long term (current) drug therapy: Secondary | ICD-10-CM

## 2021-08-15 DIAGNOSIS — F32A Depression, unspecified: Secondary | ICD-10-CM | POA: Diagnosis present

## 2021-08-15 DIAGNOSIS — Z6826 Body mass index (BMI) 26.0-26.9, adult: Secondary | ICD-10-CM

## 2021-08-15 MED ORDER — LACTATED RINGERS IV BOLUS (SEPSIS)
1000.0000 mL | Freq: Once | INTRAVENOUS | Status: AC
  Administered 2021-08-16: 1000 mL via INTRAVENOUS

## 2021-08-15 MED ORDER — SODIUM CHLORIDE 0.9 % IV SOLN
2.0000 g | Freq: Once | INTRAVENOUS | Status: AC
  Administered 2021-08-16: 2 g via INTRAVENOUS
  Filled 2021-08-15: qty 20

## 2021-08-15 NOTE — ED Triage Notes (Signed)
Per facility pt has been having n/v and blood sugar has been high.

## 2021-08-16 ENCOUNTER — Encounter (HOSPITAL_COMMUNITY): Payer: Self-pay | Admitting: Family Medicine

## 2021-08-16 ENCOUNTER — Emergency Department (HOSPITAL_COMMUNITY): Payer: Medicaid Other

## 2021-08-16 DIAGNOSIS — N2581 Secondary hyperparathyroidism of renal origin: Secondary | ICD-10-CM | POA: Diagnosis present

## 2021-08-16 DIAGNOSIS — E872 Acidosis, unspecified: Secondary | ICD-10-CM | POA: Diagnosis present

## 2021-08-16 DIAGNOSIS — Z992 Dependence on renal dialysis: Secondary | ICD-10-CM | POA: Diagnosis not present

## 2021-08-16 DIAGNOSIS — H547 Unspecified visual loss: Secondary | ICD-10-CM | POA: Diagnosis present

## 2021-08-16 DIAGNOSIS — F32A Depression, unspecified: Secondary | ICD-10-CM | POA: Diagnosis present

## 2021-08-16 DIAGNOSIS — I120 Hypertensive chronic kidney disease with stage 5 chronic kidney disease or end stage renal disease: Secondary | ICD-10-CM | POA: Diagnosis present

## 2021-08-16 DIAGNOSIS — N390 Urinary tract infection, site not specified: Secondary | ICD-10-CM

## 2021-08-16 DIAGNOSIS — D631 Anemia in chronic kidney disease: Secondary | ICD-10-CM | POA: Diagnosis present

## 2021-08-16 DIAGNOSIS — E039 Hypothyroidism, unspecified: Secondary | ICD-10-CM | POA: Diagnosis present

## 2021-08-16 DIAGNOSIS — M898X9 Other specified disorders of bone, unspecified site: Secondary | ICD-10-CM | POA: Diagnosis present

## 2021-08-16 DIAGNOSIS — D638 Anemia in other chronic diseases classified elsewhere: Secondary | ICD-10-CM | POA: Diagnosis present

## 2021-08-16 DIAGNOSIS — E1122 Type 2 diabetes mellitus with diabetic chronic kidney disease: Secondary | ICD-10-CM | POA: Diagnosis present

## 2021-08-16 DIAGNOSIS — E669 Obesity, unspecified: Secondary | ICD-10-CM | POA: Diagnosis present

## 2021-08-16 DIAGNOSIS — A419 Sepsis, unspecified organism: Principal | ICD-10-CM

## 2021-08-16 DIAGNOSIS — E785 Hyperlipidemia, unspecified: Secondary | ICD-10-CM | POA: Diagnosis present

## 2021-08-16 DIAGNOSIS — G8250 Quadriplegia, unspecified: Secondary | ICD-10-CM | POA: Diagnosis present

## 2021-08-16 DIAGNOSIS — N186 End stage renal disease: Secondary | ICD-10-CM | POA: Diagnosis present

## 2021-08-16 DIAGNOSIS — K219 Gastro-esophageal reflux disease without esophagitis: Secondary | ICD-10-CM

## 2021-08-16 DIAGNOSIS — J90 Pleural effusion, not elsewhere classified: Secondary | ICD-10-CM | POA: Diagnosis present

## 2021-08-16 DIAGNOSIS — E1169 Type 2 diabetes mellitus with other specified complication: Secondary | ICD-10-CM | POA: Diagnosis not present

## 2021-08-16 DIAGNOSIS — F259 Schizoaffective disorder, unspecified: Secondary | ICD-10-CM | POA: Diagnosis present

## 2021-08-16 DIAGNOSIS — K529 Noninfective gastroenteritis and colitis, unspecified: Secondary | ICD-10-CM | POA: Diagnosis present

## 2021-08-16 DIAGNOSIS — N281 Cyst of kidney, acquired: Secondary | ICD-10-CM | POA: Diagnosis present

## 2021-08-16 DIAGNOSIS — Z20822 Contact with and (suspected) exposure to covid-19: Secondary | ICD-10-CM | POA: Diagnosis present

## 2021-08-16 DIAGNOSIS — N3000 Acute cystitis without hematuria: Secondary | ICD-10-CM

## 2021-08-16 DIAGNOSIS — R6520 Severe sepsis without septic shock: Secondary | ICD-10-CM | POA: Diagnosis present

## 2021-08-16 DIAGNOSIS — K6289 Other specified diseases of anus and rectum: Secondary | ICD-10-CM | POA: Diagnosis present

## 2021-08-16 LAB — CBC WITH DIFFERENTIAL/PLATELET
Abs Immature Granulocytes: 0.1 10*3/uL — ABNORMAL HIGH (ref 0.00–0.07)
Basophils Absolute: 0 10*3/uL (ref 0.0–0.1)
Basophils Relative: 0 %
Eosinophils Absolute: 0.2 10*3/uL (ref 0.0–0.5)
Eosinophils Relative: 2 %
HCT: 36.8 % — ABNORMAL LOW (ref 39.0–52.0)
Hemoglobin: 11 g/dL — ABNORMAL LOW (ref 13.0–17.0)
Immature Granulocytes: 1 %
Lymphocytes Relative: 6 %
Lymphs Abs: 0.5 10*3/uL — ABNORMAL LOW (ref 0.7–4.0)
MCH: 29 pg (ref 26.0–34.0)
MCHC: 29.9 g/dL — ABNORMAL LOW (ref 30.0–36.0)
MCV: 97.1 fL (ref 80.0–100.0)
Monocytes Absolute: 0.5 10*3/uL (ref 0.1–1.0)
Monocytes Relative: 6 %
Neutro Abs: 7.8 10*3/uL — ABNORMAL HIGH (ref 1.7–7.7)
Neutrophils Relative %: 85 %
Platelets: 213 10*3/uL (ref 150–400)
RBC: 3.79 MIL/uL — ABNORMAL LOW (ref 4.22–5.81)
RDW: 16.6 % — ABNORMAL HIGH (ref 11.5–15.5)
WBC: 9.2 10*3/uL (ref 4.0–10.5)
nRBC: 0 % (ref 0.0–0.2)

## 2021-08-16 LAB — COMPREHENSIVE METABOLIC PANEL
ALT: 38 U/L (ref 0–44)
AST: 21 U/L (ref 15–41)
Albumin: 3.1 g/dL — ABNORMAL LOW (ref 3.5–5.0)
Alkaline Phosphatase: 187 U/L — ABNORMAL HIGH (ref 38–126)
Anion gap: 16 — ABNORMAL HIGH (ref 5–15)
BUN: 71 mg/dL — ABNORMAL HIGH (ref 6–20)
CO2: 24 mmol/L (ref 22–32)
Calcium: 8.6 mg/dL — ABNORMAL LOW (ref 8.9–10.3)
Chloride: 98 mmol/L (ref 98–111)
Creatinine, Ser: 6.81 mg/dL — ABNORMAL HIGH (ref 0.61–1.24)
GFR, Estimated: 9 mL/min — ABNORMAL LOW (ref >60)
Glucose, Bld: 177 mg/dL — ABNORMAL HIGH (ref 70–99)
Potassium: 4.4 mmol/L (ref 3.5–5.1)
Sodium: 138 mmol/L (ref 135–145)
Total Bilirubin: 0.5 mg/dL (ref 0.3–1.2)
Total Protein: 7.5 g/dL (ref 6.5–8.1)

## 2021-08-16 LAB — HEPATITIS C ANTIBODY: HCV Ab: NONREACTIVE

## 2021-08-16 LAB — LACTIC ACID, PLASMA
Lactic Acid, Venous: 2 mmol/L (ref 0.5–1.9)
Lactic Acid, Venous: 2.4 mmol/L (ref 0.5–1.9)

## 2021-08-16 LAB — GLUCOSE, CAPILLARY
Glucose-Capillary: 47 mg/dL — ABNORMAL LOW (ref 70–99)
Glucose-Capillary: 109 mg/dL — ABNORMAL HIGH (ref 70–99)
Glucose-Capillary: 84 mg/dL (ref 70–99)
Glucose-Capillary: 129 mg/dL — ABNORMAL HIGH (ref 70–99)
Glucose-Capillary: 147 mg/dL — ABNORMAL HIGH (ref 70–99)
Glucose-Capillary: 174 mg/dL — ABNORMAL HIGH (ref 70–99)

## 2021-08-16 LAB — HEPATITIS B SURFACE ANTIGEN: Hepatitis B Surface Ag: NONREACTIVE

## 2021-08-16 LAB — HEMOGLOBIN A1C
Hgb A1c MFr Bld: 9.4 % — ABNORMAL HIGH (ref 4.8–5.6)
Mean Plasma Glucose: 223.08 mg/dL

## 2021-08-16 LAB — APTT: aPTT: 33 seconds (ref 24–36)

## 2021-08-16 LAB — PROTIME-INR
INR: 1.2 (ref 0.8–1.2)
INR: 1.3 — ABNORMAL HIGH (ref 0.8–1.2)
Prothrombin Time: 15.2 seconds (ref 11.4–15.2)
Prothrombin Time: 15.8 seconds — ABNORMAL HIGH (ref 11.4–15.2)

## 2021-08-16 LAB — HEPATITIS B CORE ANTIBODY, TOTAL: Hep B Core Total Ab: NONREACTIVE

## 2021-08-16 LAB — HIV ANTIBODY (ROUTINE TESTING W REFLEX): HIV Screen 4th Generation wRfx: NONREACTIVE

## 2021-08-16 LAB — RESP PANEL BY RT-PCR (FLU A&B, COVID) ARPGX2
Influenza A by PCR: NEGATIVE
Influenza B by PCR: NEGATIVE
SARS Coronavirus 2 by RT PCR: NEGATIVE

## 2021-08-16 LAB — PROCALCITONIN: Procalcitonin: 2.39 ng/mL

## 2021-08-16 LAB — MRSA NEXT GEN BY PCR, NASAL: MRSA by PCR Next Gen: NOT DETECTED

## 2021-08-16 LAB — CORTISOL-AM, BLOOD: Cortisol - AM: 16.6 ug/dL (ref 6.7–22.6)

## 2021-08-16 LAB — HEPATITIS B SURFACE ANTIBODY,QUALITATIVE: Hep B S Ab: NONREACTIVE

## 2021-08-16 MED ORDER — ACETAMINOPHEN 650 MG RE SUPP: 650.0000 mg | Freq: Four times a day (QID) | RECTAL | Status: DC | PRN

## 2021-08-16 MED ORDER — DEXTROSE 50 % IV SOLN
INTRAVENOUS | Status: AC
  Filled 2021-08-16: qty 50

## 2021-08-16 MED ORDER — SEVELAMER CARBONATE 800 MG PO TABS: 800.0000 mg | ORAL_TABLET | Freq: Three times a day (TID) | ORAL | Status: DC

## 2021-08-16 MED ORDER — SEVELAMER CARBONATE 800 MG PO TABS
800.0000 mg | ORAL_TABLET | Freq: Three times a day (TID) | ORAL | Status: DC
Start: 2021-08-16 — End: 2021-08-19
  Administered 2021-08-16 – 2021-08-18 (×7): 800 mg via ORAL
  Filled 2021-08-16 (×7): qty 1

## 2021-08-16 MED ORDER — INSULIN ASPART 100 UNIT/ML IJ SOLN
0.0000 [IU] | Freq: Three times a day (TID) | INTRAMUSCULAR | Status: DC
  Administered 2021-08-16 (×2): 2 [IU] via SUBCUTANEOUS
  Administered 2021-08-17: 3 [IU] via SUBCUTANEOUS
  Administered 2021-08-17: 5 [IU] via SUBCUTANEOUS
  Administered 2021-08-18: 15 [IU] via SUBCUTANEOUS
  Administered 2021-08-18: 5 [IU] via SUBCUTANEOUS

## 2021-08-16 MED ORDER — INSULIN ASPART 100 UNIT/ML IJ SOLN
0.0000 [IU] | Freq: Every day | INTRAMUSCULAR | Status: DC
  Administered 2021-08-17: 3 [IU] via SUBCUTANEOUS

## 2021-08-16 MED ORDER — RISPERIDONE 1 MG PO TABS
2.0000 mg | ORAL_TABLET | Freq: Two times a day (BID) | ORAL | Status: DC
  Administered 2021-08-16 – 2021-08-18 (×6): 2 mg via ORAL
  Filled 2021-08-16 (×6): qty 2

## 2021-08-16 MED ORDER — OXYCODONE HCL 5 MG PO TABS
5.0000 mg | ORAL_TABLET | ORAL | Status: DC | PRN
  Administered 2021-08-19: 5 mg via ORAL
  Filled 2021-08-16: qty 1

## 2021-08-16 MED ORDER — SODIUM CHLORIDE 0.9 % IV SOLN
2.0000 g | INTRAVENOUS | Status: DC
  Administered 2021-08-17 (×2): 2 g via INTRAVENOUS
  Filled 2021-08-16 (×3): qty 20

## 2021-08-16 MED ORDER — FERROUS SULFATE 325 (65 FE) MG PO TABS
325.0000 mg | ORAL_TABLET | Freq: Every day | ORAL | Status: DC
  Administered 2021-08-17 – 2021-08-18 (×2): 325 mg via ORAL
  Filled 2021-08-16 (×2): qty 1

## 2021-08-16 MED ORDER — PANTOPRAZOLE SODIUM 40 MG PO TBEC
40.0000 mg | DELAYED_RELEASE_TABLET | Freq: Every day | ORAL | Status: DC
  Administered 2021-08-16 – 2021-08-18 (×3): 40 mg via ORAL
  Filled 2021-08-16 (×3): qty 1

## 2021-08-16 MED ORDER — ACETAMINOPHEN 325 MG PO TABS: 650.0000 mg | ORAL_TABLET | Freq: Four times a day (QID) | ORAL | Status: DC | PRN

## 2021-08-16 MED ORDER — LORAZEPAM 1 MG PO TABS
1.0000 mg | ORAL_TABLET | Freq: Three times a day (TID) | ORAL | Status: DC | PRN
Start: 2021-08-16 — End: 2021-08-19
  Administered 2021-08-18: 1 mg via ORAL
  Filled 2021-08-16: qty 1

## 2021-08-16 MED ORDER — ATORVASTATIN CALCIUM 10 MG PO TABS
10.0000 mg | ORAL_TABLET | Freq: Every day | ORAL | Status: DC
  Administered 2021-08-16 – 2021-08-18 (×3): 10 mg via ORAL
  Filled 2021-08-16 (×3): qty 1

## 2021-08-16 MED ORDER — APIXABAN 5 MG PO TABS
5.0000 mg | ORAL_TABLET | Freq: Two times a day (BID) | ORAL | Status: DC
Start: 2021-08-16 — End: 2021-08-19
  Administered 2021-08-16 – 2021-08-18 (×4): 5 mg via ORAL
  Filled 2021-08-16 (×5): qty 1

## 2021-08-16 MED ORDER — HEPARIN SODIUM (PORCINE) 5000 UNIT/ML IJ SOLN
5000.0000 [IU] | Freq: Three times a day (TID) | INTRAMUSCULAR | Status: DC
Start: 2021-08-16 — End: 2021-08-16
  Administered 2021-08-16 (×2): 5000 [IU] via SUBCUTANEOUS
  Filled 2021-08-16 (×2): qty 1

## 2021-08-16 MED ORDER — CARVEDILOL 3.125 MG PO TABS
6.2500 mg | ORAL_TABLET | Freq: Every day | ORAL | Status: DC
  Administered 2021-08-16 – 2021-08-18 (×2): 6.25 mg via ORAL
  Filled 2021-08-16 (×2): qty 2

## 2021-08-16 MED ORDER — ONDANSETRON 4 MG PO TBDP
4.0000 mg | ORAL_TABLET | Freq: Four times a day (QID) | ORAL | Status: DC | PRN
  Administered 2021-08-18: 4 mg via ORAL
  Filled 2021-08-16: qty 1

## 2021-08-16 MED ORDER — CINACALCET HCL 30 MG PO TABS
30.0000 mg | ORAL_TABLET | Freq: Every day | ORAL | Status: DC
  Administered 2021-08-17 – 2021-08-18 (×2): 30 mg via ORAL
  Filled 2021-08-16 (×2): qty 1

## 2021-08-16 MED ORDER — LORAZEPAM 1 MG PO TABS
1.0000 mg | ORAL_TABLET | Freq: Three times a day (TID) | ORAL | Status: DC
  Administered 2021-08-16 (×2): 1 mg via ORAL
  Filled 2021-08-16 (×2): qty 1

## 2021-08-16 MED ORDER — CHLORHEXIDINE GLUCONATE CLOTH 2 % EX PADS
6.0000 | MEDICATED_PAD | Freq: Every day | CUTANEOUS | Status: DC
  Administered 2021-08-17: 6 via TOPICAL

## 2021-08-16 MED ORDER — IOHEXOL 300 MG/ML  SOLN
100.0000 mL | Freq: Once | INTRAMUSCULAR | Status: AC | PRN
  Administered 2021-08-16: 100 mL via INTRAVENOUS

## 2021-08-16 NOTE — Assessment & Plan Note (Signed)
-   Consult nephro -Continue Renvela -Defer Lokelma to nephro -Continue to monitor

## 2021-08-16 NOTE — TOC Progression Note (Signed)
  Transition of Care [facility]) Screening Note   Patient Details  Name: [name] Date of Birth: 18-Mar-1970   Transition of Care [facility]) CM/SW Contact:    Boneta Lucks, RN Phone Number: 08/16/2021, 3:38 PM  From [facility].  Transition of Care Department [facility]) has reviewed patient and no TOC needs have been identified at this time. We will continue to monitor patient advancement through interdisciplinary progression rounds. If new patient transition needs arise, please place a TOC consult.    Expected Discharge Plan: [facility] (LT[facility]) Barriers to Discharge: Continued Medical Work up  Expected Discharge Plan and Services Expected Discharge Plan: [place] (LTAC)

## 2021-08-16 NOTE — Assessment & Plan Note (Signed)
-   Continue Coreg, clonidine, hydralazine

## 2021-08-16 NOTE — Consult Note (Signed)
[facility] Renal Consultation Note    Indication for Consultation:  Management of ESRD/hemodialysis; anemia, hypertension/volume and secondary hyperparathyroidism  HPI: [name] is a 51 y.o. 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  In the ED, he was noted to be febrile at 101.2, tachycardic at 110, Bp 93/77, SpO2 96%.  Labs were notable for lactate of 2.4, respiratory panel negative.  He was admitted for sepsis and we were consulted to provide dialysis during his hospitalization.    Pt is very somnolent and would not participate with interview.  HPI obtained through existing medical records.  Past Medical History:  Diagnosis Date   Acute respiratory failure with hypoxia (HCC)    Anemia    Chest pain    Constipation    Depression    Diabetes mellitus    ESRD (end stage renal disease) on dialysis [facility])    "[place] off 68; MWF" (02/25/2015)   HCAP (healthcare-associated pneumonia)    Hypertension    Hyperthyroidism    Hyperthyroidism    Quadriparesis (muscle weakness)    Schizo-affective schizophrenia ([place])    Past Surgical History:  Procedure Laterality Date   DIALYSIS FISTULA CREATION     MULTIPLE EXTRACTIONS WITH ALVEOLOPLASTY  01/17/2011   Procedure: MULTIPLE EXTRACION WITH ALVEOLOPLASTY;  Surgeon: Gae Bon;  Location: [facility] OR;  Service: Oral Surgery;  Laterality: Bilateral;  Extraction of number one, sixteen, seventeen, and thirty two.    Family History:   Family History  Family history unknown: Yes   Social History:  reports that he has never smoked. He does not have any smokeless tobacco history on file. He reports that he does not drink alcohol and does not use drugs. Allergies  Allergen Reactions   Lactose Intolerance (Gi) Diarrhea   Polymyxin B Swelling     "Can't take this eye drop," per outside records.    Sulfamethoxazole Rash    "Swelling of area, face, from drop that had sulfa," per outside records.    Prior to Admission medications   Medication Sig Start Date End Date Taking? Authorizing Provider  apixaban (ELIQUIS) 5 MG TABS tablet Take 5 mg by mouth 2 (two) times daily.   Yes [provider]  ascorbic acid (VITAMIN C) 500 MG tablet Take 1 tablet by mouth 2 (two) times daily.   Yes [provider]  atorvastatin (LIPITOR) 10 MG tablet Take 10 mg by mouth daily. 11/10/20  Yes [provider]  bisacodyl (DULCOLAX) 5 MG EC tablet Take 10 mg by mouth daily. 09/02/20  Yes [provider]  carvedilol (COREG) 6.25 MG tablet Take 6.25 mg by mouth 3 (three) times a week. Tues,thur, sat   Yes [provider]  cinacalcet (SENSIPAR) 30 MG tablet Take 30 mg by mouth daily with breakfast.   Yes [provider]  diazepam (VALIUM) 5 MG tablet Take 5 mg by mouth 3 (three) times a week. Tue, thurs, sat   Yes [provider]  docusate sodium (COLACE) 100 MG capsule Take 100 mg by mouth 2 (two) times daily.   Yes [provider]  ferrous sulfate 325 (65 FE) MG tablet Take 1 tablet by mouth daily with breakfast. 11/15/20  Yes [provider]  insulin glargine-yfgn (SEMGLEE) 100 UNIT/ML Pen Inject 15 Units into the skin in the  morning and at bedtime. 04/21/21  Yes [provider]  insulin lispro (HUMALOG) 100 UNIT/ML injection Inject 0-8 Units into the skin 3 (three) times daily before meals. If blood glucose less than 179, no lispro needed. If blood glucose 180-200, give 1 unit. If blood glucose 201-250, give 3 units. If blood glucose 251-300, give 5 units. If blood glucose 301-350, give 6 units. If blood glucose more than 351, give 8 units.   Yes [provider]  LORazepam (ATIVAN) 1 MG tablet Take 1 mg by mouth 3 (three) times daily. For anxiety    Yes [provider]  multivitamin (RENA-VIT) TABS tablet Take 1 tablet by mouth daily.     Yes [provider]  ondansetron (ZOFRAN-ODT) 4 MG disintegrating tablet Take 4 mg by mouth every 6 (six) hours as needed for nausea or vomiting.   Yes [provider]  polyethylene glycol (MIRALAX / GLYCOLAX) 17 g packet Take 17 g by mouth daily.   Yes [provider]  risperiDONE (RISPERDAL) 2 MG tablet Take 2 mg by mouth 2 (two) times daily.     Yes [provider]  sevelamer (RENVELA) 800 MG tablet Take 800 mg by mouth 3 (three) times daily.     Yes [provider]  sodium zirconium cyclosilicate (LOKELMA) 10 g PACK packet Take 10 g by mouth daily.   Yes [provider]  nitroGLYCERIN (NITROSTAT) 0.4 MG SL tablet Place 0.4 mg under the tongue every 5 (five) minutes as needed for chest pain.    [provider]   Current Facility-Administered Medications  Medication Dose Route Frequency Provider Last Rate Last Admin   acetaminophen (TYLENOL) tablet 650 mg  650 mg Oral Q6H PRN Zierle-Ghosh, Asia B, DO       Or   acetaminophen (TYLENOL) suppository 650 mg  650 mg Rectal Q6H PRN Zierle-Ghosh, Asia B, DO       atorvastatin (LIPITOR) tablet 10 mg  10 mg Oral Daily Zierle-Ghosh, Asia B, DO       carvedilol (COREG) tablet 6.25 mg  6.25 mg Oral Daily Zierle-Ghosh, Asia B, DO       [START ON 08/17/2021] cefTRIAXone (ROCEPHIN) 2 g in sodium chloride 0.9 % 100 mL IVPB  2 g Intravenous Q24H Zierle-Ghosh, Asia B, DO       cinacalcet (SENSIPAR) tablet 30 mg  30 mg Oral Q breakfast Zierle-Ghosh, Asia B, DO       dextrose 50 % solution            ferrous sulfate tablet 325 mg  325 mg Oral Q breakfast Zierle-Ghosh, Asia B, DO       heparin injection 5,000 Units  5,000 Units Subcutaneous Q8H Zierle-Ghosh, Asia B, DO   5,000 Units at 08/16/21 8921   insulin aspart (novoLOG) injection 0-15 Units  0-15 Units Subcutaneous TID WC Zierle-Ghosh, Asia B, DO       insulin  aspart (novoLOG) injection 0-5 Units  0-5 Units Subcutaneous QHS Zierle-Ghosh, Asia B, DO       LORazepam (ATIVAN) tablet 1 mg  1 mg Oral TID Zierle-Ghosh, Asia B, DO       ondansetron (ZOFRAN-ODT) disintegrating tablet 4 mg  4 mg Oral Q6H PRN Zierle-Ghosh, Asia B, DO       oxyCODONE (Oxy IR/ROXICODONE) immediate release tablet 5 mg  5 mg Oral Q4H PRN Zierle-Ghosh, Asia B, DO       pantoprazole (PROTONIX) EC tablet 40 mg  40 mg Oral  Daily Zierle-Ghosh, Asia B, DO   40 mg at 08/16/21 0742   risperiDONE (RISPERDAL) tablet 2 mg  2 mg Oral BID Zierle-Ghosh, Asia B, DO       sevelamer carbonate (RENVELA) tablet 800 mg  800 mg Oral TID Zierle-Ghosh, Asia B, DO       Labs: Basic Metabolic Panel: Recent Labs  Lab 08/16/21 0014  NA 138  K 4.4  CL 98  CO2 24  GLUCOSE 177*  BUN 71*  CREATININE 6.81*  CALCIUM 8.6*   Liver Function Tests: Recent Labs  Lab 08/16/21 0014  AST 21  ALT 38  ALKPHOS 187*  BILITOT 0.5  PROT 7.5  ALBUMIN 3.1*   No results for input(s): "LIPASE", "AMYLASE" in the last 168 hours. No results for input(s): "AMMONIA" in the last 168 hours. CBC: Recent Labs  Lab 08/16/21 0014  WBC 9.2  NEUTROABS 7.8*  HGB 11.0*  HCT 36.8*  MCV 97.1  PLT 213   Cardiac Enzymes: No results for input(s): "CKTOTAL", "CKMB", "CKMBINDEX", "TROPONINI" in the last 168 hours. CBG: Recent Labs  Lab 08/16/21 0617 08/16/21 0646 08/16/21 0735  GLUCAP 47* 109* 84   Iron Studies: No results for input(s): "IRON", "TIBC", "TRANSFERRIN", "FERRITIN" in the last 72 hours. Studies/Results: CT ABDOMEN PELVIS W CONTRAST  Result Date: 08/16/2021 CLINICAL DATA:  Dialysis patient with hyperglycemia and suspected sepsis. EXAM: CT ABDOMEN AND PELVIS WITH CONTRAST TECHNIQUE: Multidetector CT imaging of the abdomen and pelvis was performed using the standard protocol following bolus administration of intravenous contrast. RADIATION DOSE REDUCTION: This exam was performed according to the  departmental dose-optimization program which includes automated exposure control, adjustment of the mA and/or kV according to patient size and/or use of iterative reconstruction technique. CONTRAST:  124mL OMNIPAQUE IOHEXOL 300 MG/ML  SOLN COMPARISON:  CT with IV contrast 01/23/2019 FINDINGS: Lower chest: There are scattered atelectatic bands in the right base. Left lung base is clear. There is new demonstration of a small layering right and trace left pleural effusions. There is bilateral moderate gynecomastia, increased since 2020. Trace pericardial fluid appears similar. The heart is borderline prominent with three-vessel coronary artery calcifications. The tip of an infusion or dialysis catheter is in the upper right atrium. Hepatobiliary: The liver is 21.8 cm in length with homogeneous enhancement. There is intrahepatic periportal edema most likely due to fluid overload, less likely hepatitis. There is trace pericholecystic fluid without gallbladder wall thickening. This is probably also due to fluid overload less likely early cholecystitis. No calcified stones or biliary dilatation. Pancreas: There is moderate pancreatic tail atrophy. The more proximal pancreas is unremarkable. There are no inflammatory changes or ductal dilatation. No mass enhancement. Spleen: No focal abnormality or splenomegaly. Stomach/bowel: No bowel obstruction or inflammation is seen. There is a normal caliber appendix. There is a [place] nonobstructing transmesenteric internal hernia of the ascending mesocolon. There are thickened folds in the small bowel and stomach. There is circumferential thickening in the mid to distal rectum with stranding posterior to the rectum. Adrenals/urinary tract: There is no adrenal mass. Bilateral renal atrophy is noted chronically. 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 Largest is 2 cm on the left. Others are smaller and some show trace calcifications. Many are  too small to characterize. There are no urinary stones or obstruction. The bladder is thickened and there is perivesical stranding.This is increased since the prior study. Vascular/Lymphatic: There are extensive age-advanced vascular calcifications. There is no AAA. No adenopathy is seen.  Reproductive: There is no prostatomegaly. Other: There is symmetric body wall edema in the posterior buttocks. There is trace ascites in the posterior deep pelvis. There is a small umbilical fat hernia. There is no incarcerated hernia. Musculoskeletal: There is osteopenia of the pelvis and proximal femurs, osteopenia but with dense endplates in the spine consistent with renal osteodystrophy. No regional fracture is seen. IMPRESSION: 1. Increased thickening and adjacent stranding involving the bladder, concerning for cystitis. 2. Increased thickening and dorsal stranding of the rectum which could be congestive wall thickening, proctitis or infiltrating disease. Consider follow-up colonoscopy when clinically feasible. 3. Gastroenteritis without evidence of small-bowel obstruction or inflammation. 4. Atrophic kidneys with innumerable cysts and too small to characterize hypodensities consistent with chronic dialysis. 5. Intrahepatic periportal edema probably due to fluid overload, less likely hepatitis. 6. Minimal pericholecystic fluid most likely due to fluid overload, less likely cholecystitis. 7. Small right and trace left pleural effusions. No lung base infiltrates. 8. Increased changes of diffuse fibroglandular gynecomastia. 9. Aortic atherosclerosis and additional extensive vascular calcifications. Electronically Signed   By: Telford Nab M.D.   On: 08/16/2021 03:55   CT HEAD WO CONTRAST (5MM)  Result Date: 08/16/2021 CLINICAL DATA:  Hyperglycemia with delirium. EXAM: CT HEAD WITHOUT CONTRAST TECHNIQUE: Contiguous axial images were obtained from the base of the skull through the vertex without intravenous contrast.  RADIATION DOSE REDUCTION: This exam was performed according to the departmental dose-optimization program which includes automated exposure control, adjustment of the mA and/or kV according to patient size and/or use of iterative reconstruction technique. COMPARISON:  Head CT 02/11/2021. FINDINGS: Brain: Imaging of the skull base is compromised by patient motion. The inferior cerebellar hemispheres are obscured by motion. There is moderate age-advanced cerebral atrophy with atrophic ventriculomegaly and moderate to severe small vessel disease of the cerebral white matter versus demyelinating disease. The visualized cerebellum and brainstem are relatively unremarkable. No asymmetry is seen concerning for an acute infarct, hemorrhage or mass. There is no midline shift. Benign dural calcifications are scattered along the falx. Vascular: There are calcifications in the carotid siphons but no hyperdense central vessels. Skull: No skull fracture or lesion is evident with poor visualization of the skull base due to patient motion. Sinuses/Orbits: No obvious sinus abnormality accounting for patient motion. There are bilateral chronic retinal and lens calcifications both globes, posteromedial dislocation of the left lens, and chronic asymmetrically smaller size of the left globe. There is extensive fluid opacification of the right mastoid air cells, middle ear cavity. This was seen previously. Other: None. IMPRESSION: 1. No acute intracranial CT findings or changes. 2. Age-advanced atrophy and hypoattenuating small-vessel disease. 3. Diffuse fluid opacification of the right mastoid air cells and middle ear, seen previously. Electronically Signed   By: Telford Nab M.D.   On: 08/16/2021 03:32   DG Chest Port 1 View  Result Date: 08/16/2021 CLINICAL DATA:  Nausea vomiting high blood sugar EXAM: PORTABLE CHEST 1 VIEW COMPARISON:  03/25/2021 FINDINGS: Right-sided central venous catheter tips over the right atrium. Small  right-sided pleural effusion. Vascular stent beneath the right clavicle. No consolidation or pneumothorax. Stable cardiomediastinal silhouette with vascular congestion. IMPRESSION: 1. Small right-sided pleural effusion 2. Vascular congestion Electronically Signed   By: Donavan Foil M.D.   On: 08/16/2021 00:29    ROS: Review of systems not obtained due to patient factors. Physical Exam: Vitals:   08/16/21 0800 08/16/21 0900 08/16/21 1000 08/16/21 1100  BP: 125/63 122/62 112/61   Pulse: (!) 102 98 91  89  Resp: 12 13 18 14   Temp:      TempSrc:      SpO2: 97% 94% 93% 96%  Weight:      Height:          Weight change:   Intake/Output Summary (Last 24 hours) at 08/16/2021 1117 Last data filed at 08/16/2021 0730 Gross per 24 hour  Intake 2000 ml  Output --  Net 2000 ml   BP 112/61   Pulse 89   Temp 98 F (36.7 C) (Oral)   Resp 14   Ht 6\' 1"  (1.854 m)   Wt 90.4 kg   SpO2 96%   BMI 26.29 kg/m  General appearance: uncooperative and somnolent Head: Normocephalic, without obvious abnormality, atraumatic Resp: clear to auscultation bilaterally Cardio: regular rate and rhythm, S1, S2 normal, no murmur, click, rub or gallop GI: soft, non-tender; bowel sounds normal; no masses,  no organomegaly Extremities: extremities normal, atraumatic, no cyanosis or edema and RUE AVF +T/B Dialysis Access:  Dialysis Orders: Center: [facility]  on MWF . EDW 84 kg HD Bath 2K/2.5Ca  Time 4 hours Heparin none. Access RIJ TDC and RUE AVF BFR 400 DFR 500      Assessment/Plan:  Sepsis - unclear source.  CT of abd/pelvis consistent with gastroenteritis.  Started on Rocephin.  No pressors required at present  ESRD -  will continue with HD on MWF schedule  Hypertension/volume  - stable  Anemia  - Hgb 11, no ESA  Metabolic bone disease -   continue with home meds when able to take po  Nutrition -  per primary  Donetta Potts, MD [place] Pager [phone] 08/16/2021,  11:17 AM

## 2021-08-16 NOTE — Progress Notes (Signed)
Patient admitted to the hospital earlier this morning by Dr. Clearence Ped  Patient seen and examined.  Sleeping on my arrival.  Does answer some basic questions to voice, but falls back asleep.  Becomes annoyed when he is repeatedly asked the same question.  Lungs are clear bilaterally abdomen is mildly tender on palpation.  No edema lower extremities bilaterally.  Dialysis catheter noted in right chest.  He does have a fistula in his right arm.  This is a 51 year old resident of skilled nursing facility, admitted to the hospital with sepsis.  He has a history of quadriplegia, schizoaffective disorder, hypothyroidism, diabetes, prior DVT on eliquis.  Etiology of sepsis is not entirely clear.  Records indicate that he was having vomiting prior to admission.  He may have also been having diarrhea.  CT abdomen does comment on possible gastroenteritis.  There is also mention of cystitis, though he does not make urine.  He has been started on empiric antibiotics.  Follow-up cultures.  We will continue supportive management for now.  Nephrology following for dialysis needs.  He usually receives dialysis Monday, Wednesday, Friday.  [facility]

## 2021-08-16 NOTE — H&P (Signed)
History and Physical    Patient: [name] XBM:[id number] DOB: 02-Jan-1971 DOA: 08/15/2021 DOS: the patient was seen and examined on 08/16/2021 PCP: Caprice Renshaw, MD  Patient coming from: SNF  Chief Complaint:  Chief Complaint  Patient presents with   Emesis   HPI: [name] is a 51 y.o. male with medical history significant of anemia of chronic disease, ESRD, diabetes mellitus type 2, hypertension, hypothyroidism, quadriparesis, schizoaffective disorder, and more presents to the ED with a chief complaint not feeling well.  Patient is an excessively poor historian.  He gets annoyed when asking questions.  According to chart review patient had emesis and hyperglycemia at the facility.  Patient does report that he has had nausea and vomiting.  He does not think it is emesis has had any blood in it.  He reports he had diarrhea but he does not know how many times a day.  He denies any dysuria, hematuria and then denies making any urine at all.  He did not feel feverish or know of any fever until he arrived in the ER.  Patient reports he has had myalgias, chest pain, dyspnea.  He has not had a cough.  Patient reports he is not sure when any of the symptoms started.  He is oriented to place and self but not to time.  He knows that he lives in a facility but is not sure which 1.  No further history could be obtained at this time.  Patient reports he does not smoke, does not drink alcohol, does not use illicit drugs.  He is vaccinated for COVID.  Patient is full code.  Review of Systems: As mentioned in the history of present illness. All other systems reviewed and are negative. Past Medical History:  Diagnosis Date   Acute respiratory failure with hypoxia (HCC)    Anemia    Chest pain    Constipation    Depression    Diabetes mellitus    ESRD (end stage renal disease) on dialysis [facility])    "[place] off 68; MWF" (02/25/2015)   HCAP (healthcare-associated pneumonia)     Hypertension    Hyperthyroidism    Hyperthyroidism    Quadriparesis (muscle weakness)    Schizo-affective schizophrenia ([place])    Past Surgical History:  Procedure Laterality Date   DIALYSIS FISTULA CREATION     MULTIPLE EXTRACTIONS WITH ALVEOLOPLASTY  01/17/2011   Procedure: MULTIPLE EXTRACION WITH ALVEOLOPLASTY;  Surgeon: Gae Bon;  Location: [facility] OR;  Service: Oral Surgery;  Laterality: Bilateral;  Extraction of number one, sixteen, seventeen, and thirty two.    Social History:  reports that he has never smoked. He does not have any smokeless tobacco history on file. He reports that he does not drink alcohol and does not use drugs.  Allergies  Allergen Reactions   Lactose Intolerance (Gi) Diarrhea   Polymyxin B Swelling    "Can't take this eye drop," per outside records.    Sulfamethoxazole Rash    "Swelling of area, face, from drop that had sulfa," per outside records.     Family History  Family history unknown: Yes    Prior to Admission medications   Medication Sig Start Date End Date Taking? Authorizing Provider  amLODipine (NORVASC) 10 MG tablet Take 1 tablet (10 mg total) by mouth at bedtime. Patient not taking: Reported on 03/25/2021 02/27/15   Kinnie Feil, MD  ascorbic acid (VITAMIN C) 500 MG tablet Take 1 tablet by mouth 2 (  two) times daily.    [provider]  atorvastatin (LIPITOR) 10 MG tablet Take 10 mg by mouth daily. 11/10/20   [provider]  bisacodyl (DULCOLAX) 10 MG suppository Place 1 suppository (10 mg total) rectally daily as needed for moderate constipation. Patient not taking: Reported on 03/25/2021 02/27/15   Kinnie Feil, MD  bisacodyl (DULCOLAX) 5 MG EC tablet Take 10 mg by mouth daily. 09/02/20   [provider]  carvedilol (COREG) 6.25 MG tablet Take 6.25 mg by mouth daily.    [provider]  cefTAZidime 2 g in dextrose 5 % 50 mL Inject 2 g into the vein once. LAST dose on HD 03/12/15 Patient not  taking: Reported on 03/25/2021 03/11/15   Orson Eva, MD  cinacalcet (SENSIPAR) 30 MG tablet Take 30 mg by mouth daily with breakfast.    [provider]  cloNIDine (CATAPRES) 0.1 MG tablet Take 1 tablet (0.1 mg total) by mouth 3 (three) times daily. Patient not taking: Reported on 03/25/2021 02/27/15   Kinnie Feil, MD  docusate sodium (COLACE) 100 MG capsule Take 100 mg by mouth 2 (two) times daily.    [provider]  ferrous sulfate 325 (65 FE) MG tablet Take 1 tablet by mouth daily with breakfast. 11/15/20   [provider]  hydrALAZINE (APRESOLINE) 100 MG tablet Take 1 tablet (100 mg total) by mouth 3 (three) times daily. Patient not taking: Reported on 03/25/2021 02/27/15   Kinnie Feil, MD  insulin glargine (LANTUS) 100 UNIT/ML injection Inject 0.12 mLs (12 Units total) into the skin daily. Patient taking differently: Inject 8 Units into the skin daily. 03/11/15   Orson Eva, MD  insulin lispro (HUMALOG) 100 UNIT/ML injection Inject 0-8 Units into the skin 3 (three) times daily before meals. 1-8 units 3 times daily with meals. Subcutaneous administration. If blood glucose less than 179, no lispro needed. If blood glucose 180-200, give 1 unit. If blood glucose 201-250, give 3 units. If blood glucose 251-300, give 5 units. If blood glucose 301-350, give 6 units. If blood glucose more than 351, give 8 units.    [provider]  labetalol (NORMODYNE) 100 MG tablet Take 1 tablet (100 mg total) by mouth 2 (two) times daily. Patient not taking: Reported on 03/25/2021 02/27/15   Kinnie Feil, MD  LORazepam (ATIVAN) 1 MG tablet Take 1 mg by mouth 3 (three) times daily. For anxiety     [provider]  multivitamin (RENA-VIT) TABS tablet Take 1 tablet by mouth daily.      [provider]  nitroGLYCERIN (NITROSTAT) 0.4 MG SL tablet Place 0.4 mg under the tongue every 5 (five) minutes as needed. For chest pain    [provider]   ofloxacin (FLOXIN) 0.3 % OTIC solution Place 3 drops into the left ear 2 (two) times daily. For up to one week. Patient not taking: Reported on 01/23/2019 02/27/18   Vanessa Kick, MD  ondansetron (ZOFRAN-ODT) 4 MG disintegrating tablet Take 4 mg by mouth every 6 (six) hours as needed for nausea or vomiting.    [provider]  pantoprazole (PROTONIX) 40 MG tablet Take 40 mg by mouth daily.      [provider]  polyethylene glycol (MIRALAX / GLYCOLAX) 17 g packet Take 17 g by mouth daily.    [provider]  prednisoLONE acetate (PRED FORTE) 1 % ophthalmic suspension Place 1 drop into the left eye 2 (two) times daily as needed (inflammation).  Patient not taking: Reported on 01/08/2020 10/20/14   [provider]  risperiDONE (RISPERDAL) 2 MG tablet Take 2 mg by mouth 2 (two) times daily.      [provider]  senna-docusate (SENOKOT-S) 8.6-50 MG tablet Take 1 tablet by mouth 2 (two) times daily. Patient not taking: Reported on 03/25/2021 02/27/15   Kinnie Feil, MD  sevelamer (RENVELA) 800 MG tablet Take 800 mg by mouth 3 (three) times daily.      [provider]  sodium zirconium cyclosilicate (LOKELMA) 10 g PACK packet Take 10 g by mouth daily.    [provider]  Vancomycin (VANCOCIN) 750 MG/150ML SOLN Inject 150 mLs (750 mg total) into the vein every Monday, Wednesday, and Friday with hemodialysis. LAST dose on HD 03/12/15 Patient not taking: Reported on 03/25/2021 03/11/15   Orson Eva, MD    Physical Exam: Vitals:   08/16/21 0230 08/16/21 0345 08/16/21 0352 08/16/21 0400  BP: 123/62     Pulse: (!) 112 (!) 110  (!) 108  Resp: _0 Temp:   (!) 101.2 F (38.4 C)   TempSrc:      SpO2: 97% 96%  97%  Weight:      Height:       1.  General: Patient lying supine in bed,  no acute distress   2. Psychiatric: Alert and oriented x 2, mood is irritable and behavior is normal for situation, cooperative with exam   3.  Neurologic: Speech and language are normal, face is symmetric, sensation in all 4 extremities without voluntary movement of any of the 4 extremities, at baseline without acute deficits on limited exam   4. HEENMT:  Head is atraumatic, normocephalic, pupils reactive to light, neck is supple, trachea is midline, mucous membranes are moist   5. Respiratory : Lungs are clear to auscultation bilaterally without wheezing, rhonchi, rales, no cyanosis, no increase in work of breathing or accessory muscle use   6. Cardiovascular : Heart rate normal, rhythm is regular, no murmurs, rubs or gallops, no peripheral edema, peripheral pulses palpated   7. Gastrointestinal:  Abdomen is soft, nondistended, nontender to palpation bowel sounds active, no masses or organomegaly palpated   8. Skin:  Skin is warm, dry and intact without rashes, acute lesions, or ulcers on limited exam   9.Musculoskeletal:  No acute deformities or trauma, no asymmetry in tone, no peripheral edema, peripheral pulses palpated, no tenderness to palpation in the extremities  Data Reviewed: In the ED Temp one 1.2-102.7, heart rate 108-143, respiratory rate 11-18, blood pressure 93/50-150/77 No leukocytosis, hemoglobin 11.0 Chemistry reveals an elevated BUN 71, elevated creatinine 6.81 in this ESRD patient Hyperglycemic at 177, gap 16, bicarb 24 Alk phos 187, albumin 3.1 Lactic acid as high as 2.4 Negative COVID Blood culture pending Abdomen pelvis shows gastroenteritis without SBO, cystitis, atrophic kidneys, and some fluid overload CT head shows no intracranial abnormality Chest x-ray shows small right-sided pleural effusion and vascular congestion Patient was given 3 L bolus, started on Rocephin for sepsis Admission requested for management of sepsis  Assessment and Plan: * Sepsis ([place]) Temp 102.7 at arrival, heart rate 143, lactic acidosis up to 2.4 -Blood cultures pending -Negative COVID -Chest x-ray shows small  right-sided pleural effusion and vascular congestion -CT abdomen pelvis shows gastroenteritis without SBO, chronic changes including atrophic kidneys with innumerable cysts, intrahepatic periportal edema, pleural effusions as well as acute cystitis -Patient reports that he does not make urine so we have  not been able to collect urine sample at this time -Rocephin started in the ED, continue Rocephin -BP was initially soft at 93/50, 3 L bolus given in the ED-given ESRD and will not continue fluids now that blood pressure stabilized  Diabetes mellitus type 2 in obese (HCC) - 8 units basal insulin at baseline -Hold basal insulin while in hospital continue sliding scale coverage -Last A1c 6.4 -Updated A1c pending -Monitor CBGs  GERD (gastroesophageal reflux disease) - Continue PPI  UTI (urinary tract infection) Cystitis on CT Continue Rocephin Dialysis if we can get a urine sample in this ESRD patient with See plan for sepsis  Hypertension - Continue Coreg, clonidine, hydralazine  ESRD (end stage renal disease) on dialysis ([place]) - Consult nephro -Continue Renvela -Defer Lokelma to nephro -Continue to monitor      Advance Care Planning:   Code Status: Full Code \  Consults: None  Family Communication: No family at bedside  Severity of Illness: The appropriate patient status for this patient is INPATIENT. Inpatient status is judged to be reasonable and necessary in order to provide the required intensity of service to ensure the patient's safety. The patient's presenting symptoms, physical exam findings, and initial radiographic and laboratory data in the context of their chronic comorbidities is felt to place them at high risk for further clinical deterioration. Furthermore, it is not anticipated that the patient will be medically stable for discharge from the hospital within 2 midnights of admission.   * I certify that at the point of admission it is my clinical judgment that  the patient will require inpatient hospital care spanning beyond 2 midnights from the point of admission due to high intensity of service, high risk for further deterioration and high frequency of surveillance required.*  Author: Rolla Plate, DO 08/16/2021 6:18 AM  For on call review [email or web address].

## 2021-08-16 NOTE — Progress Notes (Signed)
Sepsis tracking by eLINK 

## 2021-08-16 NOTE — Plan of Care (Signed)
  Problem: Education: Goal: Knowledge of General Education information will improve Description: Including pain rating scale, medication(s)/side effects and non-pharmacologic comfort measures Outcome: Progressing   Problem: Health Behavior/Discharge Planning: Goal: Ability to manage health-related needs will improve Outcome: Progressing   Problem: Clinical Measurements: Goal: Ability to maintain clinical measurements within normal limits will improve Outcome: Progressing Goal: Will remain free from infection Outcome: Progressing Goal: Diagnostic test results will improve Outcome: Progressing Goal: Respiratory complications will improve Outcome: Progressing Goal: Cardiovascular complication will be avoided Outcome: Progressing   Problem: Activity: Goal: Risk for activity intolerance will decrease Outcome: Progressing   Problem: Nutrition: Goal: Adequate nutrition will be maintained Outcome: Progressing   Problem: Coping: Goal: Level of anxiety will decrease Outcome: Progressing   Problem: Elimination: Goal: Will not experience complications related to bowel motility Outcome: Progressing Goal: Will not experience complications related to urinary retention Outcome: Progressing   Problem: Pain Managment: Goal: General experience of comfort will improve Outcome: Progressing   Problem: Safety: Goal: Ability to remain free from injury will improve Outcome: Progressing   Problem: Skin Integrity: Goal: Risk for impaired skin integrity will decrease Outcome: Progressing   Problem: Education: Goal: Ability to describe self-care measures that may prevent or decrease complications (Diabetes Survival Skills Education) will improve Outcome: Progressing Goal: Individualized Educational Video(s) Outcome: Progressing   Problem: Coping: Goal: Ability to adjust to condition or change in health will improve Outcome: Progressing   Problem: Fluid Volume: Goal: Ability to  maintain a balanced intake and output will improve Outcome: Progressing   Problem: Health Behavior/Discharge Planning: Goal: Ability to identify and utilize available resources and services will improve Outcome: Progressing Goal: Ability to manage health-related needs will improve Outcome: Progressing   Problem: Metabolic: Goal: Ability to maintain appropriate glucose levels will improve Outcome: Progressing   Problem: Nutritional: Goal: Maintenance of adequate nutrition will improve Outcome: Progressing Goal: Progress toward achieving an optimal weight will improve Outcome: Progressing   Problem: Skin Integrity: Goal: Risk for impaired skin integrity will decrease Outcome: Progressing   Problem: Tissue Perfusion: Goal: Adequacy of tissue perfusion will improve Outcome: Progressing   Problem: Fluid Volume: Goal: Hemodynamic stability will improve Outcome: Progressing   Problem: Clinical Measurements: Goal: Diagnostic test results will improve Outcome: Progressing Goal: Signs and symptoms of infection will decrease Outcome: Progressing   Problem: Respiratory: Goal: Ability to maintain adequate ventilation will improve Outcome: Progressing   

## 2021-08-16 NOTE — Assessment & Plan Note (Signed)
-   8 units basal insulin at baseline -Hold basal insulin while in hospital continue sliding scale coverage -Last A1c 6.4 -Updated A1c pending -Monitor CBGs

## 2021-08-16 NOTE — ED Provider Notes (Signed)
[facility] Emergency Department Provider Note MRN:  [id number]  Arrival date & time: 08/16/21     Chief Complaint   Emesis   History of Present Illness   [name] is a 51 y.o. year-old male with a history of paraplegia, ESRD presenting to the ED with chief complaint of emesis.  Patient coming from facility with report of emesis.  Arrives tachycardic and febrile.  Review of Systems  A thorough review of systems was obtained and all systems are negative except as noted in the HPI and PMH.   Patient's Health History    Past Medical History:  Diagnosis Date   Acute respiratory failure with hypoxia (HCC)    Anemia    Chest pain    Constipation    Depression    Diabetes mellitus    ESRD (end stage renal disease) on dialysis [facility])    "[place] off 68; MWF" (02/25/2015)   HCAP (healthcare-associated pneumonia)    Hypertension    Hyperthyroidism    Hyperthyroidism    Quadriparesis (muscle weakness)    Schizo-affective schizophrenia ([place])     Past Surgical History:  Procedure Laterality Date   DIALYSIS FISTULA CREATION     MULTIPLE EXTRACTIONS WITH ALVEOLOPLASTY  01/17/2011   Procedure: MULTIPLE EXTRACION WITH ALVEOLOPLASTY;  Surgeon: Gae Bon;  Location: [facility] OR;  Service: Oral Surgery;  Laterality: Bilateral;  Extraction of number one, sixteen, seventeen, and thirty two.     Family History  Family history unknown: Yes    Social History   Socioeconomic History   Marital status: Single    Spouse name: Not on file   Number of children: Not on file   Years of education: Not on file   Highest education level: Not on file  Occupational History   Not on file  Tobacco Use   Smoking status: Never   Smokeless tobacco: Not on file  Vaping Use   Vaping Use: Never used  Substance and Sexual Activity   Alcohol use: No   Drug use: No   Sexual activity: Not on file  Other Topics Concern   Not on file  Social History  Narrative   Not on file   Social Determinants of Health   Financial Resource Strain: Not on file  Food Insecurity: Not on file  Transportation Needs: Not on file  Physical Activity: Not on file  Stress: Not on file  Social Connections: Not on file  Intimate Partner Violence: Not on file     Physical Exam   Vitals:   08/16/21 0345 08/16/21 0352  BP:    Pulse: (!) 110   Resp: 16   Temp:  (!) 101.2 F (38.4 C)  SpO2: 96%     CONSTITUTIONAL: Chronically ill-appearing, NAD, diaphoretic NEURO/PSYCH: Somnolent, wakes to voice, oriented to name, seems confused, paraplegic EYES:  eyes equal and reactive ENT/NECK:  no LAD, no JVD CARDIO: Tachycardic rate, well-perfused, normal S1 and S2 PULM:  CTAB no wheezing or rhonchi GI/GU:  non-distended, non-tender MSK/SPINE:  No gross deformities, no edema SKIN:  no rash, atraumatic   *Additional and/or pertinent findings included in MDM below  Diagnostic and Interventional Summary    EKG Interpretation  Date/Time:  Monday August 15 2021 23:47:05 EDT Ventricular Rate:  143 PR Interval:  137 QRS Duration: 81 QT Interval:  289 QTC Calculation: 446 R Axis:   85 Text Interpretation: Sinus tachycardia Abnormal T, consider ischemia, diffuse leads Confirmed by Gerlene Fee [phone]) on  08/16/2021 1:15:55 AM       Labs Reviewed  LACTIC ACID, PLASMA - Abnormal; Notable for the following components:      Result Value   Lactic Acid, Venous 2.0 (*)    All other components within normal limits  LACTIC ACID, PLASMA - Abnormal; Notable for the following components:   Lactic Acid, Venous 2.4 (*)    All other components within normal limits  COMPREHENSIVE METABOLIC PANEL - Abnormal; Notable for the following components:   Glucose, Bld 177 (*)    BUN 71 (*)    Creatinine, Ser 6.81 (*)    Calcium 8.6 (*)    Albumin 3.1 (*)    Alkaline Phosphatase 187 (*)    GFR, Estimated 9 (*)    Anion gap 16 (*)    All other components within normal  limits  CBC WITH DIFFERENTIAL/PLATELET - Abnormal; Notable for the following components:   RBC 3.79 (*)    Hemoglobin 11.0 (*)    HCT 36.8 (*)    MCHC 29.9 (*)    RDW 16.6 (*)    Neutro Abs 7.8 (*)    Lymphs Abs 0.5 (*)    Abs Immature Granulocytes 0.10 (*)    All other components within normal limits  RESP PANEL BY RT-PCR (FLU A&B, COVID) ARPGX2  CULTURE, BLOOD (ROUTINE X 2)  CULTURE, BLOOD (ROUTINE X 2)  URINE CULTURE  PROTIME-INR  APTT  URINALYSIS, ROUTINE W REFLEX MICROSCOPIC    CT HEAD WO CONTRAST (5MM)  Final Result    CT ABDOMEN PELVIS W CONTRAST  Final Result    DG Chest Port 1 View  Final Result      Medications  lactated ringers bolus 1,000 mL (0 mLs Intravenous Stopped 08/16/21 0352)    And  lactated ringers bolus 1,000 mL (1,000 mLs Intravenous New Bag/Given 08/16/21 0352)    And  lactated ringers bolus 1,000 mL (0 mLs Intravenous Stopped 08/16/21 0240)  cefTRIAXone (ROCEPHIN) 2 g in sodium chloride 0.9 % 100 mL IVPB (0 g Intravenous Stopped 08/16/21 0119)  iohexol (OMNIPAQUE) 300 MG/ML solution 100 mL (100 mLs Intravenous Contrast Given 08/16/21 0309)     Procedures  /  Critical Care .Critical Care  Performed by: Maudie Flakes, MD Authorized by: Maudie Flakes, MD   Critical care provider statement:    Critical care time (minutes):  45   Critical care was necessary to treat or prevent imminent or life-threatening deterioration of the following conditions:  Sepsis   Critical care was time spent personally by me on the following activities:  Development of treatment plan with patient or surrogate, discussions with consultants, evaluation of patient's response to treatment, examination of patient, ordering and review of laboratory studies, ordering and review of radiographic studies, ordering and performing treatments and interventions, pulse oximetry, re-evaluation of patient's condition and review of old charts   ED Course and Medical Decision Making   Initial Impression and Ddx Initial concern for sepsis given the fever, significant tachycardia with rates in the 140s.  Sinus rhythm on EKG.  Given chronic Foley use, UTI is suspected.  Abdomen is soft.  No hypoxia, awaiting labs, urinalysis.  Providing empiric 30 cc/kg fluid bolus, ceftriaxone.  Past medical/surgical history that increases complexity of ED encounter: Paraplegia  Interpretation of Diagnostics I personally reviewed the EKG and my interpretation is as follows: Sinus tachycardia  Labs reveal kidney function consistent with ESRD, lactate is elevated and slightly increased on repeat.  Otherwise no significant blood  count or electrolyte disturbance.  CT imaging revealing cystitis, so suspect source of sepsis is UTI.  Patient claiming he cannot make urine at this time.  Patient Reassessment and Ultimate Disposition/Management     Will admit to medicine.  Patient management required discussion with the following services or consulting groups:  Hospitalist Service  Complexity of Problems Addressed Acute illness or injury that poses threat of life of bodily function  Additional Data Reviewed and Analyzed Further history obtained from: Prior labs/imaging results  Additional Factors Impacting ED Encounter Risk Consideration of hospitalization  Barth Kirks. [place]onia Small, [place] [email or web address]  Final Clinical Impressions(s) / ED Diagnoses     ICD-10-CM   1. Sepsis, due to unspecified organism, unspecified whether acute organ dysfunction present [facility])  A41.9       ED Discharge Orders     None        Discharge Instructions Discussed with and Provided to Patient:   Discharge Instructions   None      Maudie Flakes, MD 08/16/21 [phone]

## 2021-08-16 NOTE — Assessment & Plan Note (Signed)
Continue PPI ?

## 2021-08-16 NOTE — Assessment & Plan Note (Signed)
Cystitis on CT Continue Rocephin Dialysis if we can get a urine sample in this ESRD patient with See plan for sepsis

## 2021-08-16 NOTE — Assessment & Plan Note (Signed)
Temp 102.7 at arrival, heart rate 143, lactic acidosis up to 2.4 -Blood cultures pending -Negative COVID -Chest x-ray shows small right-sided pleural effusion and vascular congestion -CT abdomen pelvis shows gastroenteritis without SBO, chronic changes including atrophic kidneys with innumerable cysts, intrahepatic periportal edema, pleural effusions as well as acute cystitis -Patient reports that he does not make urine so we have not been able to collect urine sample at this time -Rocephin started in the ED, continue Rocephin -BP was initially soft at 93/50, 3 L bolus given in the ED-given ESRD and will not continue fluids now that blood pressure stabilized

## 2021-08-17 DIAGNOSIS — N186 End stage renal disease: Secondary | ICD-10-CM

## 2021-08-17 DIAGNOSIS — K219 Gastro-esophageal reflux disease without esophagitis: Secondary | ICD-10-CM | POA: Diagnosis not present

## 2021-08-17 DIAGNOSIS — Z992 Dependence on renal dialysis: Secondary | ICD-10-CM

## 2021-08-17 DIAGNOSIS — A419 Sepsis, unspecified organism: Secondary | ICD-10-CM | POA: Diagnosis not present

## 2021-08-17 DIAGNOSIS — I10 Essential (primary) hypertension: Secondary | ICD-10-CM

## 2021-08-17 DIAGNOSIS — E1169 Type 2 diabetes mellitus with other specified complication: Secondary | ICD-10-CM

## 2021-08-17 DIAGNOSIS — E669 Obesity, unspecified: Secondary | ICD-10-CM

## 2021-08-17 LAB — CBC WITH DIFFERENTIAL/PLATELET
Abs Immature Granulocytes: 0.05 10*3/uL (ref 0.00–0.07)
Basophils Absolute: 0 10*3/uL (ref 0.0–0.1)
Basophils Relative: 1 %
Eosinophils Absolute: 0.2 10*3/uL (ref 0.0–0.5)
Eosinophils Relative: 2 %
HCT: 32.2 % — ABNORMAL LOW (ref 39.0–52.0)
Hemoglobin: 9.8 g/dL — ABNORMAL LOW (ref 13.0–17.0)
Immature Granulocytes: 1 %
Lymphocytes Relative: 23 %
Lymphs Abs: 2 10*3/uL (ref 0.7–4.0)
MCH: 29.3 pg (ref 26.0–34.0)
MCHC: 30.4 g/dL (ref 30.0–36.0)
MCV: 96.4 fL (ref 80.0–100.0)
Monocytes Absolute: 0.9 10*3/uL (ref 0.1–1.0)
Monocytes Relative: 10 %
Neutro Abs: 5.5 10*3/uL (ref 1.7–7.7)
Neutrophils Relative %: 63 %
Platelets: 211 10*3/uL (ref 150–400)
RBC: 3.34 MIL/uL — ABNORMAL LOW (ref 4.22–5.81)
RDW: 16.5 % — ABNORMAL HIGH (ref 11.5–15.5)
WBC: 8.6 10*3/uL (ref 4.0–10.5)
nRBC: 0 % (ref 0.0–0.2)

## 2021-08-17 LAB — AMMONIA: Ammonia: 29 umol/L (ref 9–35)

## 2021-08-17 LAB — COMPREHENSIVE METABOLIC PANEL
ALT: 45 U/L — ABNORMAL HIGH (ref 0–44)
AST: 28 U/L (ref 15–41)
Albumin: 2.8 g/dL — ABNORMAL LOW (ref 3.5–5.0)
Alkaline Phosphatase: 169 U/L — ABNORMAL HIGH (ref 38–126)
Anion gap: 15 (ref 5–15)
BUN: 84 mg/dL — ABNORMAL HIGH (ref 6–20)
CO2: 21 mmol/L — ABNORMAL LOW (ref 22–32)
Calcium: 8.6 mg/dL — ABNORMAL LOW (ref 8.9–10.3)
Chloride: 97 mmol/L — ABNORMAL LOW (ref 98–111)
Creatinine, Ser: 8.39 mg/dL — ABNORMAL HIGH (ref 0.61–1.24)
GFR, Estimated: 7 mL/min — ABNORMAL LOW (ref >60)
Glucose, Bld: 244 mg/dL — ABNORMAL HIGH (ref 70–99)
Potassium: 5.5 mmol/L — ABNORMAL HIGH (ref 3.5–5.1)
Sodium: 133 mmol/L — ABNORMAL LOW (ref 135–145)
Total Bilirubin: 0.5 mg/dL (ref 0.3–1.2)
Total Protein: 6.9 g/dL (ref 6.5–8.1)

## 2021-08-17 LAB — RENAL FUNCTION PANEL
Albumin: 2.6 g/dL — ABNORMAL LOW (ref 3.5–5.0)
Anion gap: 17 — ABNORMAL HIGH (ref 5–15)
BUN: 85 mg/dL — ABNORMAL HIGH (ref 6–20)
CO2: 22 mmol/L (ref 22–32)
Calcium: 8.4 mg/dL — ABNORMAL LOW (ref 8.9–10.3)
Chloride: 96 mmol/L — ABNORMAL LOW (ref 98–111)
Creatinine, Ser: 8.35 mg/dL — ABNORMAL HIGH (ref 0.61–1.24)
GFR, Estimated: 7 mL/min — ABNORMAL LOW (ref >60)
Glucose, Bld: 179 mg/dL — ABNORMAL HIGH (ref 70–99)
Phosphorus: 5.9 mg/dL — ABNORMAL HIGH (ref 2.5–4.6)
Potassium: 5.3 mmol/L — ABNORMAL HIGH (ref 3.5–5.1)
Sodium: 135 mmol/L (ref 135–145)

## 2021-08-17 LAB — CBC
HCT: 29.1 % — ABNORMAL LOW (ref 39.0–52.0)
Hemoglobin: 9 g/dL — ABNORMAL LOW (ref 13.0–17.0)
MCH: 29.5 pg (ref 26.0–34.0)
MCHC: 30.9 g/dL (ref 30.0–36.0)
MCV: 95.4 fL (ref 80.0–100.0)
Platelets: 203 10*3/uL (ref 150–400)
RBC: 3.05 MIL/uL — ABNORMAL LOW (ref 4.22–5.81)
RDW: 16.7 % — ABNORMAL HIGH (ref 11.5–15.5)
WBC: 8.2 10*3/uL (ref 4.0–10.5)
nRBC: 0 % (ref 0.0–0.2)

## 2021-08-17 LAB — GLUCOSE, CAPILLARY
Glucose-Capillary: 185 mg/dL — ABNORMAL HIGH (ref 70–99)
Glucose-Capillary: 228 mg/dL — ABNORMAL HIGH (ref 70–99)
Glucose-Capillary: 105 mg/dL — ABNORMAL HIGH (ref 70–99)
Glucose-Capillary: 261 mg/dL — ABNORMAL HIGH (ref 70–99)

## 2021-08-17 LAB — HEPATITIS B SURFACE ANTIBODY, QUANTITATIVE: Hep B S AB Quant (Post): 4.4 m[IU]/mL — ABNORMAL LOW (ref >9.9)

## 2021-08-17 LAB — MAGNESIUM: Magnesium: 2.1 mg/dL (ref 1.7–2.4)

## 2021-08-17 LAB — TSH: TSH: 2.941 u[IU]/mL (ref 0.350–4.500)

## 2021-08-17 MED ORDER — ALTEPLASE 2 MG IJ SOLR: 2.0000 mg | Freq: Once | INTRAMUSCULAR | Status: DC | PRN

## 2021-08-17 MED ORDER — ALBUMIN HUMAN 25 % IV SOLN: 25.0000 g | INTRAVENOUS | Status: DC | PRN

## 2021-08-17 MED ORDER — IPRATROPIUM-ALBUTEROL 0.5-2.5 (3) MG/3ML IN SOLN: 3.0000 mL | RESPIRATORY_TRACT | Status: DC | PRN

## 2021-08-17 MED ORDER — GUAIFENESIN 100 MG/5ML PO LIQD: 5.0000 mL | ORAL | Status: DC | PRN

## 2021-08-17 MED ORDER — TRAZODONE HCL 50 MG PO TABS
50.0000 mg | ORAL_TABLET | Freq: Every evening | ORAL | Status: DC | PRN
  Administered 2021-08-17 – 2021-08-18 (×2): 50 mg via ORAL
  Filled 2021-08-17 (×2): qty 1

## 2021-08-17 MED ORDER — METRONIDAZOLE 500 MG/100ML IV SOLN
500.0000 mg | Freq: Two times a day (BID) | INTRAVENOUS | Status: DC
  Administered 2021-08-17 – 2021-08-18 (×3): 500 mg via INTRAVENOUS
  Filled 2021-08-17 (×4): qty 100

## 2021-08-17 MED ORDER — ALBUMIN HUMAN 25 % IV SOLN
INTRAVENOUS | Status: AC
  Administered 2021-08-17: 25 g via INTRAVENOUS
  Filled 2021-08-17: qty 100

## 2021-08-17 MED ORDER — HYDRALAZINE HCL 20 MG/ML IJ SOLN: 10.0000 mg | INTRAMUSCULAR | Status: DC | PRN

## 2021-08-17 MED ORDER — SENNOSIDES-DOCUSATE SODIUM 8.6-50 MG PO TABS: 1.0000 | ORAL_TABLET | Freq: Every evening | ORAL | Status: DC | PRN

## 2021-08-17 MED ORDER — HEPARIN SODIUM (PORCINE) 1000 UNIT/ML DIALYSIS: 1000.0000 [IU] | INTRAMUSCULAR | Status: DC | PRN

## 2021-08-17 MED ORDER — HEPARIN SODIUM (PORCINE) 1000 UNIT/ML IJ SOLN
INTRAMUSCULAR | Status: AC
  Administered 2021-08-17: 3800 [IU]
  Filled 2021-08-17: qty 5

## 2021-08-17 MED ORDER — METOPROLOL TARTRATE 5 MG/5ML IV SOLN: 5.0000 mg | INTRAVENOUS | Status: DC | PRN

## 2021-08-17 NOTE — Progress Notes (Signed)
Patient ID: [name], male   DOB: [birth year], 51 y.o.   MRN: [id number] S: More awake and alert today.  Asking if he can have dilaudid.  When asked why, he stated he has chest pain. O:BP (!) 160/83 (BP Location: Left Arm)   Pulse 89   Temp 97.8 F (36.6 C) (Oral)   Resp 17   Ht 6\' 1"  (1.854 m)   Wt 90.4 kg   SpO2 100%   BMI 26.29 kg/m   Intake/Output Summary (Last 24 hours) at 08/17/2021 1020 Last data filed at 08/17/2021 0827 Gross per 24 hour  Intake 820.06 ml  Output --  Net 820.06 ml   Intake/Output: I/O last 3 completed shifts: In: 2580.1 [P.O.:480; IV Piggyback:2100.1] Out: -   Intake/Output this shift:  Total I/O In: 240 [P.O.:240] Out: -  Weight change:  Gen:NAD CVS: RRR Resp:CTA Abd:+BS, soft, NT/ND Ext: no edema, RUE AVF+T/B  Recent Labs  Lab 08/16/21 0014  NA 138  K 4.4  CL 98  CO2 24  GLUCOSE 177*  BUN 71*  CREATININE 6.81*  ALBUMIN 3.1*  CALCIUM 8.6*  AST 21  ALT 38   Liver Function Tests: Recent Labs  Lab 08/16/21 0014  AST 21  ALT 38  ALKPHOS 187*  BILITOT 0.5  PROT 7.5  ALBUMIN 3.1*   No results for input(s): "LIPASE", "AMYLASE" in the last 168 hours. No results for input(s): "AMMONIA" in the last 168 hours. CBC: Recent Labs  Lab 08/16/21 0014  WBC 9.2  NEUTROABS 7.8*  HGB 11.0*  HCT 36.8*  MCV 97.1  PLT 213   Cardiac Enzymes: No results for input(s): "CKTOTAL", "CKMB", "CKMBINDEX", "TROPONINI" in the last 168 hours. CBG: Recent Labs  Lab 08/16/21 0735 08/16/21 1130 08/16/21 1625 08/16/21 2124 08/17/21 0736  GLUCAP 84 129* 147* 174* 185*    Iron Studies: No results for input(s): "IRON", "TIBC", "TRANSFERRIN", "FERRITIN" in the last 72 hours. Studies/Results: CT ABDOMEN PELVIS W CONTRAST  Result Date: 08/16/2021 CLINICAL DATA:  Dialysis patient with hyperglycemia and suspected sepsis. EXAM: CT ABDOMEN AND PELVIS WITH CONTRAST TECHNIQUE: Multidetector CT imaging of the abdomen and pelvis was performed using  the standard protocol following bolus administration of intravenous contrast. RADIATION DOSE REDUCTION: This exam was performed according to the departmental dose-optimization program which includes automated exposure control, adjustment of the mA and/or kV according to patient size and/or use of iterative reconstruction technique. CONTRAST:  174mL OMNIPAQUE IOHEXOL 300 MG/ML  SOLN COMPARISON:  CT with IV contrast 01/23/2019 FINDINGS: Lower chest: There are scattered atelectatic bands in the right base. Left lung base is clear. There is new demonstration of a small layering right and trace left pleural effusions. There is bilateral moderate gynecomastia, increased since 2020. Trace pericardial fluid appears similar. The heart is borderline prominent with three-vessel coronary artery calcifications. The tip of an infusion or dialysis catheter is in the upper right atrium. Hepatobiliary: The liver is 21.8 cm in length with homogeneous enhancement. There is intrahepatic periportal edema most likely due to fluid overload, less likely hepatitis. There is trace pericholecystic fluid without gallbladder wall thickening. This is probably also due to fluid overload less likely early cholecystitis. No calcified stones or biliary dilatation. Pancreas: There is moderate pancreatic tail atrophy. The more proximal pancreas is unremarkable. There are no inflammatory changes or ductal dilatation. No mass enhancement. Spleen: No focal abnormality or splenomegaly. Stomach/bowel: No bowel obstruction or inflammation is seen. There is a normal caliber appendix. There is a [place] nonobstructing  transmesenteric internal hernia of the ascending mesocolon. There are thickened folds in the small bowel and stomach. There is circumferential thickening in the mid to distal rectum with stranding posterior to the rectum. Adrenals/urinary tract: There is no adrenal mass. Bilateral renal atrophy is noted chronically. 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 Largest is 2 cm on the left. Others are smaller and some show trace calcifications. Many are too small to characterize. There are no urinary stones or obstruction. The bladder is thickened and there is perivesical stranding.This is increased since the prior study. Vascular/Lymphatic: There are extensive age-advanced vascular calcifications. There is no AAA. No adenopathy is seen. Reproductive: There is no prostatomegaly. Other: There is symmetric body wall edema in the posterior buttocks. There is trace ascites in the posterior deep pelvis. There is a small umbilical fat hernia. There is no incarcerated hernia. Musculoskeletal: There is osteopenia of the pelvis and proximal femurs, osteopenia but with dense endplates in the spine consistent with renal osteodystrophy. No regional fracture is seen. IMPRESSION: 1. Increased thickening and adjacent stranding involving the bladder, concerning for cystitis. 2. Increased thickening and dorsal stranding of the rectum which could be congestive wall thickening, proctitis or infiltrating disease. Consider follow-up colonoscopy when clinically feasible. 3. Gastroenteritis without evidence of small-bowel obstruction or inflammation. 4. Atrophic kidneys with innumerable cysts and too small to characterize hypodensities consistent with chronic dialysis. 5. Intrahepatic periportal edema probably due to fluid overload, less likely hepatitis. 6. Minimal pericholecystic fluid most likely due to fluid overload, less likely cholecystitis. 7. Small right and trace left pleural effusions. No lung base infiltrates. 8. Increased changes of diffuse fibroglandular gynecomastia. 9. Aortic atherosclerosis and additional extensive vascular calcifications. Electronically Signed   By: Telford Nab M.D.   On: 08/16/2021 03:55   CT HEAD WO CONTRAST (5MM)  Result Date: 08/16/2021 CLINICAL DATA:  Hyperglycemia with delirium. EXAM: CT  HEAD WITHOUT CONTRAST TECHNIQUE: Contiguous axial images were obtained from the base of the skull through the vertex without intravenous contrast. RADIATION DOSE REDUCTION: This exam was performed according to the departmental dose-optimization program which includes automated exposure control, adjustment of the mA and/or kV according to patient size and/or use of iterative reconstruction technique. COMPARISON:  Head CT 02/11/2021. FINDINGS: Brain: Imaging of the skull base is compromised by patient motion. The inferior cerebellar hemispheres are obscured by motion. There is moderate age-advanced cerebral atrophy with atrophic ventriculomegaly and moderate to severe small vessel disease of the cerebral white matter versus demyelinating disease. The visualized cerebellum and brainstem are relatively unremarkable. No asymmetry is seen concerning for an acute infarct, hemorrhage or mass. There is no midline shift. Benign dural calcifications are scattered along the falx. Vascular: There are calcifications in the carotid siphons but no hyperdense central vessels. Skull: No skull fracture or lesion is evident with poor visualization of the skull base due to patient motion. Sinuses/Orbits: No obvious sinus abnormality accounting for patient motion. There are bilateral chronic retinal and lens calcifications both globes, posteromedial dislocation of the left lens, and chronic asymmetrically smaller size of the left globe. There is extensive fluid opacification of the right mastoid air cells, middle ear cavity. This was seen previously. Other: None. IMPRESSION: 1. No acute intracranial CT findings or changes. 2. Age-advanced atrophy and hypoattenuating small-vessel disease. 3. Diffuse fluid opacification of the right mastoid air cells and middle ear, seen previously. Electronically Signed   By: Telford Nab M.D.   On: 08/16/2021 03:32   DG  Chest Port 1 View  Result Date: 08/16/2021 CLINICAL DATA:  Nausea vomiting high  blood sugar EXAM: PORTABLE CHEST 1 VIEW COMPARISON:  03/25/2021 FINDINGS: Right-sided central venous catheter tips over the right atrium. Small right-sided pleural effusion. Vascular stent beneath the right clavicle. No consolidation or pneumothorax. Stable cardiomediastinal silhouette with vascular congestion. IMPRESSION: 1. Small right-sided pleural effusion 2. Vascular congestion Electronically Signed   By: Donavan Foil M.D.   On: 08/16/2021 00:29    apixaban  5 mg Oral BID   atorvastatin  10 mg Oral Daily   carvedilol  6.25 mg Oral Daily   Chlorhexidine Gluconate Cloth  6 each Topical Q0600   cinacalcet  30 mg Oral Q breakfast   ferrous sulfate  325 mg Oral Q breakfast   insulin aspart  0-15 Units Subcutaneous TID WC   insulin aspart  0-5 Units Subcutaneous QHS   pantoprazole  40 mg Oral Daily   risperiDONE  2 mg Oral BID   sevelamer carbonate  800 mg Oral TID WC    BMET    Component Value Date/Time   NA 138 08/16/2021 0014   K 4.4 08/16/2021 0014   CL 98 08/16/2021 0014   CO2 24 08/16/2021 0014   GLUCOSE 177 (H) 08/16/2021 0014   BUN 71 (H) 08/16/2021 0014   CREATININE 6.81 (H) 08/16/2021 0014   CREATININE 5.04 (H) 06/26/2013 0949   CALCIUM 8.6 (L) 08/16/2021 0014   CALCIUM 7.8 (L) 12/25/2007 0400   GFRNONAA 9 (L) 08/16/2021 0014   GFRNONAA 13 (L) 06/26/2013 0949   GFRAA 16 (L) 01/30/2019 0139   GFRAA 15 (L) 06/26/2013 0949   CBC    Component Value Date/Time   WBC 9.2 08/16/2021 0014   RBC 3.79 (L) 08/16/2021 0014   HGB 11.0 (L) 08/16/2021 0014   HCT 36.8 (L) 08/16/2021 0014   PLT 213 08/16/2021 0014   MCV 97.1 08/16/2021 0014   MCH 29.0 08/16/2021 0014   MCHC 29.9 (L) 08/16/2021 0014   RDW 16.6 (H) 08/16/2021 0014   LYMPHSABS 0.5 (L) 08/16/2021 0014   MONOABS 0.5 08/16/2021 0014   EOSABS 0.2 08/16/2021 0014   BASOSABS 0.0 08/16/2021 0014    Dialysis Orders: Center: [facility]  on MWF . EDW 84 kg HD Bath 2K/2.5Ca  Time 4 hours Heparin none. Access RIJ TDC and  RUE AVF BFR 400 DFR 500        Assessment/Plan:  Sepsis - unclear source.  CT of abd/pelvis consistent with gastroenteritis.  Started on Rocephin.  No pressors required at present and out of the ICU.  ESRD -  will continue with HD on MWF schedule  Hypertension/volume  - stable  Anemia  - Hgb 11, no ESA  Metabolic bone disease -   continue with home meds when able to take po  Nutrition -  per primary  Chest pain - appears musculoskeletal.  Will defer pain management to primary svc.  Donetta Potts, MD [facility]

## 2021-08-17 NOTE — Progress Notes (Signed)
PROGRESS NOTE    [name]  BWI:[id number] DOB: 05-23-1970 DOA: 08/15/2021 PCP: Caprice Renshaw, MD   Brief Narrative:  51 year old resident of skilled nursing facility, admitted to the hospital with sepsis.  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  Etiology of sepsis is not entirely clear.  Records indicate that he was having vomiting prior to admission.  He may have also been having diarrhea.  CT abdomen does comment on possible gastroenteritis.  There is also mention of cystitis, though he does not make urine.  He has been started on empiric antibiotics.  Follow-up cultures.  Nephrology following for hemodialysis.   Assessment & Plan:  Principal Problem:   Sepsis ([place]) Active Problems:   ESRD (end stage renal disease) on dialysis ([place])   Hypertension   UTI (urinary tract infection)   GERD (gastroesophageal reflux disease)   Diabetes mellitus type 2 in obese (H[facility])     Assessment and Plan: Sepsis secondary to gastroenteritis, proctitis and urinary tract infection -Sepsis physiology is improving.  COVID-19 negative.  Chest x-ray showed small right-sided pleural effusion with vascular congestion.  CT abdomen pelvis showed gastroenteritis, chronic atrophic kidneys with innumerable cyst, periportal edema and possible cystitis. GI Panel/C diff ordered.  - Continue IV Rocephin, add IV Flagyl.   UTI (urinary tract infection) -Seen on CT scan.  Possibly chronic?Mar[place] Kitchen  He is on dialysis.  Currently already on IV Rocephin.  Altered mental status - Suspect from underlying infection.  CT head does not show any acute pathology.  Shows chronic changes.  We will check TSH, ammonia levels.  Diabetes mellitus type 2 in obese ([place]), insulin-dependent -A1c 9.3.  Holding basal insulin.  Currently on sliding scale.  GERD (gastroesophageal reflux disease) -Protonix 40 mg orally daily  Left upper  extremity DVT - Currently on Eliquis, outpatient medicine  Hypertension -Blood pressure parameters improved.  Continue Coreg  ESRD (end stage renal disease) on dialysis ([place]) Anemia of chronic disease -Hemodialysis per nephrology team.  On Sensipar, Renvela -Iron supplements  Schizophrenia -Continue home medications-Risperdal  History of TBI, bedbound with quadriplegia - Frequent turning.  Supportive care.  Fall precautions.  Hyperlipidemia - Statin    DVT prophylaxis: Eliquis Code Status: Full code Family Communication:    Status is: Inpatient Remains inpatient appropriate because: Still having abdominal discomfort but slowly improving over last 24 hours.  If this continues to improve hopefully patient can be discharged in next 24-48 hours.     Subjective: Seen and examined at bedside, tells me he feels better compared to yesterday.  He is more awake and alert.  He is asking me for an "antibiotic" named " Dilaudid" because of chest pain. When I asked him more, he is unable to tell me exactly where but tells me his pain his 9/10.  I told him overall he appears comfortable and at this time doesn't quailify for the need of dilaudid.     Examination:  Constitutional: Not in acute distress.  Appears chronically ill.  Legally blind Respiratory: Clear to auscultation bilaterally Cardiovascular: Normal sinus rhythm, no rubs Abdomen: Nontender nondistended good bowel sounds Musculoskeletal: No edema noted Skin: No rashes seen Neurologic: Alert to name place and date Psychiatric: Poor judgment and insight Quadriplegic Right upper extremity AV fistula  Objective: Vitals:   08/16/21 1329 08/16/21 1723 08/16/21 2253 08/17/21 0413  BP: 127/75 (!) 165/94 (!) 143/84 (!) 160/83  Pulse: 93 91 90 89  Resp: 16 16 19 17   Temp: 98 F (36.7 C) (!) 97.5 F (36.4 C) 98.1 F (36.7 C) 97.8 F (36.6 C)  TempSrc: Oral  Oral Oral  SpO2: 100% 100% 95% 100%  Weight:      Height:         Intake/Output Summary (Last 24 hours) at 08/17/2021 0737 Last data filed at 08/17/2021 0328 Gross per 24 hour  Intake 580.06 ml  Output --  Net 580.06 ml   Filed Weights   08/15/21 2342 08/16/21 0613  Weight: 88 kg 90.4 kg     Data Reviewed:   CBC: Recent Labs  Lab 08/16/21 0014  WBC 9.2  NEUTROABS 7.8*  HGB 11.0*  HCT 36.8*  MCV 97.1  PLT 160   Basic Metabolic Panel: Recent Labs  Lab 08/16/21 0014  NA 138  K 4.4  CL 98  CO2 24  GLUCOSE 177*  BUN 71*  CREATININE 6.81*  CALCIUM 8.6*   GFR: Estimated Creatinine Clearance: 14.8 mL/min (A) (by C-G formula based on SCr of 6.81 mg/dL (H)). Liver Function Tests: Recent Labs  Lab 08/16/21 0014  AST 21  ALT 38  ALKPHOS 187*  BILITOT 0.5  PROT 7.5  ALBUMIN 3.1*   No results for input(s): "LIPASE", "AMYLASE" in the last 168 hours. No results for input(s): "AMMONIA" in the last 168 hours. Coagulation Profile: Recent Labs  Lab 08/16/21 0014 08/16/21 0646  INR 1.2 1.3*   Cardiac Enzymes: No results for input(s): "CKTOTAL", "CKMB", "CKMBINDEX", "TROPONINI" in the last 168 hours. BNP (last 3 results) No results for input(s): "PROBNP" in the last 8760 hours. HbA1C: Recent Labs    08/16/21 0014  HGBA1C 9.4*   CBG: Recent Labs  Lab 08/16/21 0735 08/16/21 1130 08/16/21 1625 08/16/21 2124 08/17/21 0736  GLUCAP 84 129* 147* 174* 185*   Lipid Profile: No results for input(s): "CHOL", "HDL", "LDLCALC", "TRIG", "CHOLHDL", "LDLDIRECT" in the last 72 hours. Thyroid Function Tests: No results for input(s): "TSH", "T4TOTAL", "FREET4", "T3FREE", "THYROIDAB" in the last 72 hours. Anemia Panel: No results for input(s): "VITAMINB12", "FOLATE", "FERRITIN", "TIBC", "IRON", "RETICCTPCT" in the last 72 hours. Sepsis Labs: Recent Labs  Lab 08/16/21 0014 08/16/21 0218  PROCALCITON 2.39  --   LATICACIDVEN 2.0* 2.4*    Recent Results (from the past 240 hour(s))  Blood Culture (routine x 2)     Status: None  (Preliminary result)   Collection Time: 08/16/21 12:14 AM   Specimen: BLOOD RIGHT FOREARM  Result Value Ref Range Status   Specimen Description BLOOD RIGHT FOREARM  Final   Special Requests   Final    BOTTLES DRAWN AEROBIC AND ANAEROBIC Blood Culture results may not be optimal due to an excessive volume of blood received in culture bottles   Culture   Final    NO GROWTH < 24 HOURS Performed at [facility], [address] [place], [place] [address]    Report Status PENDING  Incomplete  Blood Culture (routine x 2)     Status: None (Preliminary result)   Collection Time: 08/16/21 12:14 AM   Specimen: Right Antecubital; Blood  Result Value Ref Range Status   Specimen Description RIGHT ANTECUBITAL  Final   Special Requests   Final    BOTTLES DRAWN AEROBIC AND ANAEROBIC Blood Culture adequate volume   Culture   Final    NO GROWTH < 24 HOURS Performed at [facility], [address] [place], [place] [address]    Report Status PENDING  Incomplete  Resp Panel by RT-PCR (Flu A&B, Covid) Anterior Nasal Swab     Status: None   Collection Time: 08/16/21 12:15 AM   Specimen: Anterior Nasal Swab  Result Value Ref Range Status   SARS Coronavirus 2 by RT PCR NEGATIVE NEGATIVE Final    Comment: (NOTE) SARS-CoV-2 target nucleic acids are NOT DETECTED.  The SARS-CoV-2 RNA is generally detectable in upper respiratory specimens during the acute phase of infection. The lowest concentration of SARS-CoV-2 viral copies this assay can detect is 138 copies/mL. A negative result does not preclude SARS-Cov-2 infection and should not be used as the sole basis for treatment or other patient management decisions. A negative result may occur with  improper specimen collection/handling, submission of specimen other than nasopharyngeal swab, presence of viral mutation(s) within the areas targeted by this assay, and inadequate number of viral copies(<138 copies/mL). A negative result must be combined  with clinical observations, patient history, and epidemiological information. The expected result is Negative.  Fact Sheet for Patients:  EntrepreneurPulse.com.au  Fact Sheet for Healthcare Providers:  IncredibleEmployment.be  This test is no t yet approved or cleared by the [place] FDA and  has been authorized for detection and/or diagnosis of SARS-CoV-2 by FDA under an Emergency Use Authorization (EUA). This EUA will remain  in effect (meaning this test can be used) for the duration of the COVID-19 declaration under Section 564(b)(1) of the Act, 21 U.S.C.section 360bbb-3(b)(1), unless the authorization is terminated  or revoked sooner.       Influenza A by PCR NEGATIVE NEGATIVE Final   Influenza B by PCR NEGATIVE NEGATIVE Final    Comment: (NOTE) The Xpert Xpress SARS-CoV-2/FLU/RSV plus assay is intended as an aid in the diagnosis of influenza from Nasopharyngeal swab specimens and should not be used as a sole basis for treatment. Nasal washings and aspirates are unacceptable for Xpert Xpress SARS-CoV-2/FLU/RSV testing.  Fact Sheet for Patients: EntrepreneurPulse.com.au  Fact Sheet for Healthcare Providers: IncredibleEmployment.be  This test is not yet approved or cleared by the [place] FDA and has been authorized for detection and/or diagnosis of SARS-CoV-2 by FDA under an Emergency Use Authorization (EUA). This EUA will remain in effect (meaning this test can be used) for the duration of the COVID-19 declaration under Section 564(b)(1) of the Act, 21 U.S.C. section 360bbb-3(b)(1), unless the authorization is terminated or revoked.  Performed at [facility], [address], [place], [place] [address]   MRSA Next Gen by PCR, Nasal     Status: None   Collection Time: 08/16/21  6:04 AM   Specimen: Nasal Mucosa; Nasal Swab  Result Value Ref Range Status   MRSA by PCR Next Gen NOT DETECTED  NOT DETECTED Final    Comment: (NOTE) The GeneXpert MRSA Assay (FDA approved for NASAL specimens only), is one component of a comprehensive MRSA colonization surveillance program. It is not intended to diagnose MRSA infection nor to guide or monitor treatment for MRSA infections. Test performance is not FDA approved in patients less than 53 years old. Performed at [facility], [address], [place], [place] [address]          Radiology Studies: CT ABDOMEN PELVIS W CONTRAST  Result Date: 08/16/2021 CLINICAL DATA:  Dialysis patient with hyperglycemia and suspected sepsis. EXAM: CT ABDOMEN AND PELVIS WITH CONTRAST TECHNIQUE: Multidetector CT imaging of the abdomen and pelvis was performed using the standard protocol following bolus administration of intravenous contrast. RADIATION DOSE REDUCTION: This exam was performed according to the departmental  dose-optimization program which includes automated exposure control, adjustment of the mA and/or kV according to patient size and/or use of iterative reconstruction technique. CONTRAST:  117mL OMNIPAQUE IOHEXOL 300 MG/ML  SOLN COMPARISON:  CT with IV contrast 01/23/2019 FINDINGS: Lower chest: There are scattered atelectatic bands in the right base. Left lung base is clear. There is new demonstration of a small layering right and trace left pleural effusions. There is bilateral moderate gynecomastia, increased since 2020. Trace pericardial fluid appears similar. The heart is borderline prominent with three-vessel coronary artery calcifications. The tip of an infusion or dialysis catheter is in the upper right atrium. Hepatobiliary: The liver is 21.8 cm in length with homogeneous enhancement. There is intrahepatic periportal edema most likely due to fluid overload, less likely hepatitis. There is trace pericholecystic fluid without gallbladder wall thickening. This is probably also due to fluid overload less likely early cholecystitis. No calcified stones  or biliary dilatation. Pancreas: There is moderate pancreatic tail atrophy. The more proximal pancreas is unremarkable. There are no inflammatory changes or ductal dilatation. No mass enhancement. Spleen: No focal abnormality or splenomegaly. Stomach/bowel: No bowel obstruction or inflammation is seen. There is a normal caliber appendix. There is a [place] nonobstructing transmesenteric internal hernia of the ascending mesocolon. There are thickened folds in the small bowel and stomach. There is circumferential thickening in the mid to distal rectum with stranding posterior to the rectum. Adrenals/urinary tract: There is no adrenal mass. Bilateral renal atrophy is noted chronically. 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 Largest is 2 cm on the left. Others are smaller and some show trace calcifications. Many are too small to characterize. There are no urinary stones or obstruction. The bladder is thickened and there is perivesical stranding.This is increased since the prior study. Vascular/Lymphatic: There are extensive age-advanced vascular calcifications. There is no AAA. No adenopathy is seen. Reproductive: There is no prostatomegaly. Other: There is symmetric body wall edema in the posterior buttocks. There is trace ascites in the posterior deep pelvis. There is a small umbilical fat hernia. There is no incarcerated hernia. Musculoskeletal: There is osteopenia of the pelvis and proximal femurs, osteopenia but with dense endplates in the spine consistent with renal osteodystrophy. No regional fracture is seen. IMPRESSION: 1. Increased thickening and adjacent stranding involving the bladder, concerning for cystitis. 2. Increased thickening and dorsal stranding of the rectum which could be congestive wall thickening, proctitis or infiltrating disease. Consider follow-up colonoscopy when clinically feasible. 3. Gastroenteritis without evidence of small-bowel obstruction or  inflammation. 4. Atrophic kidneys with innumerable cysts and too small to characterize hypodensities consistent with chronic dialysis. 5. Intrahepatic periportal edema probably due to fluid overload, less likely hepatitis. 6. Minimal pericholecystic fluid most likely due to fluid overload, less likely cholecystitis. 7. Small right and trace left pleural effusions. No lung base infiltrates. 8. Increased changes of diffuse fibroglandular gynecomastia. 9. Aortic atherosclerosis and additional extensive vascular calcifications. Electronically Signed   By: Telford Nab M.D.   On: 08/16/2021 03:55   CT HEAD WO CONTRAST (5MM)  Result Date: 08/16/2021 CLINICAL DATA:  Hyperglycemia with delirium. EXAM: CT HEAD WITHOUT CONTRAST TECHNIQUE: Contiguous axial images were obtained from the base of the skull through the vertex without intravenous contrast. RADIATION DOSE REDUCTION: This exam was performed according to the departmental dose-optimization program which includes automated exposure control, adjustment of the mA and/or kV according to patient size and/or use of iterative reconstruction technique. COMPARISON:  Head CT 02/11/2021. FINDINGS: Brain: Imaging  of the skull base is compromised by patient motion. The inferior cerebellar hemispheres are obscured by motion. There is moderate age-advanced cerebral atrophy with atrophic ventriculomegaly and moderate to severe small vessel disease of the cerebral white matter versus demyelinating disease. The visualized cerebellum and brainstem are relatively unremarkable. No asymmetry is seen concerning for an acute infarct, hemorrhage or mass. There is no midline shift. Benign dural calcifications are scattered along the falx. Vascular: There are calcifications in the carotid siphons but no hyperdense central vessels. Skull: No skull fracture or lesion is evident with poor visualization of the skull base due to patient motion. Sinuses/Orbits: No obvious sinus abnormality  accounting for patient motion. There are bilateral chronic retinal and lens calcifications both globes, posteromedial dislocation of the left lens, and chronic asymmetrically smaller size of the left globe. There is extensive fluid opacification of the right mastoid air cells, middle ear cavity. This was seen previously. Other: None. IMPRESSION: 1. No acute intracranial CT findings or changes. 2. Age-advanced atrophy and hypoattenuating small-vessel disease. 3. Diffuse fluid opacification of the right mastoid air cells and middle ear, seen previously. Electronically Signed   By: Telford Nab M.D.   On: 08/16/2021 03:32   DG Chest Port 1 View  Result Date: 08/16/2021 CLINICAL DATA:  Nausea vomiting high blood sugar EXAM: PORTABLE CHEST 1 VIEW COMPARISON:  03/25/2021 FINDINGS: Right-sided central venous catheter tips over the right atrium. Small right-sided pleural effusion. Vascular stent beneath the right clavicle. No consolidation or pneumothorax. Stable cardiomediastinal silhouette with vascular congestion. IMPRESSION: 1. Small right-sided pleural effusion 2. Vascular congestion Electronically Signed   By: Donavan Foil M.D.   On: 08/16/2021 00:29        Scheduled Meds:  apixaban  5 mg Oral BID   atorvastatin  10 mg Oral Daily   carvedilol  6.25 mg Oral Daily   Chlorhexidine Gluconate Cloth  6 each Topical Q0600   cinacalcet  30 mg Oral Q breakfast   ferrous sulfate  325 mg Oral Q breakfast   insulin aspart  0-15 Units Subcutaneous TID WC   insulin aspart  0-5 Units Subcutaneous QHS   pantoprazole  40 mg Oral Daily   risperiDONE  2 mg Oral BID   sevelamer carbonate  800 mg Oral TID WC   Continuous Infusions:  cefTRIAXone (ROCEPHIN)  IV Stopped (08/17/21 0056)     LOS: 1 day   Time spent= 35 mins    Ziyah Cordoba Arsenio Loader, MD [facility]  If 7PM-7AM, please contact night-coverage  08/17/2021, 7:37 AM

## 2021-08-17 NOTE — Procedures (Signed)
   HEMODIALYSIS TREATMENT NOTE:   UF was limited by hypotension despite Albumin.  1.5 liters removed.  All blood was returned.  No changes from pre-HD assessment.  Rockwell Alexandria, RN

## 2021-08-18 DIAGNOSIS — N3000 Acute cystitis without hematuria: Secondary | ICD-10-CM

## 2021-08-18 DIAGNOSIS — A419 Sepsis, unspecified organism: Secondary | ICD-10-CM | POA: Diagnosis not present

## 2021-08-18 DIAGNOSIS — E1169 Type 2 diabetes mellitus with other specified complication: Secondary | ICD-10-CM | POA: Diagnosis not present

## 2021-08-18 DIAGNOSIS — N186 End stage renal disease: Secondary | ICD-10-CM | POA: Diagnosis not present

## 2021-08-18 DIAGNOSIS — K219 Gastro-esophageal reflux disease without esophagitis: Secondary | ICD-10-CM | POA: Diagnosis not present

## 2021-08-18 LAB — GLUCOSE, CAPILLARY
Glucose-Capillary: 430 mg/dL — ABNORMAL HIGH (ref 70–99)
Glucose-Capillary: 384 mg/dL — ABNORMAL HIGH (ref 70–99)
Glucose-Capillary: 215 mg/dL — ABNORMAL HIGH (ref 70–99)
Glucose-Capillary: 101 mg/dL — ABNORMAL HIGH (ref 70–99)

## 2021-08-18 MED ORDER — CIPROFLOXACIN HCL 500 MG PO TABS: 500.0000 mg | ORAL_TABLET | Freq: Two times a day (BID) | ORAL | 0 refills | Status: AC

## 2021-08-18 MED ORDER — METRONIDAZOLE 500 MG PO TABS
500.0000 mg | ORAL_TABLET | Freq: Once | ORAL | Status: AC
  Administered 2021-08-19: 500 mg via ORAL
  Filled 2021-08-18: qty 1

## 2021-08-18 MED ORDER — INSULIN ASPART 100 UNIT/ML IJ SOLN
20.0000 [IU] | Freq: Once | INTRAMUSCULAR | Status: AC
  Administered 2021-08-18: 20 [IU] via SUBCUTANEOUS

## 2021-08-18 MED ORDER — LACTINEX PO CHEW: 1.0000 | CHEWABLE_TABLET | Freq: Three times a day (TID) | ORAL | 0 refills | Status: AC

## 2021-08-18 MED ORDER — METRONIDAZOLE 500 MG PO TABS: 500.0000 mg | ORAL_TABLET | Freq: Three times a day (TID) | ORAL | 0 refills | Status: AC

## 2021-08-18 MED ORDER — INSULIN DETEMIR 100 UNIT/ML ~~LOC~~ SOLN
10.0000 [IU] | Freq: Every day | SUBCUTANEOUS | Status: DC
Start: 2021-08-18 — End: 2021-08-19
  Filled 2021-08-18: qty 0.1

## 2021-08-18 MED ORDER — HYDROMORPHONE HCL 1 MG/ML IJ SOLN
1.0000 mg | Freq: Once | INTRAMUSCULAR | Status: AC
  Administered 2021-08-18: 1 mg via INTRAVENOUS
  Filled 2021-08-18: qty 1

## 2021-08-18 NOTE — Progress Notes (Signed)
Patient refused vitals and CBG. IV was removed prior to this shift as patient was going to be discharged. Refused to have IV replaced for IV Flagyl. Notified Dr. Josephine Cables to make aware of refusals. New order for Flagyl to be changed to PO.

## 2021-08-18 NOTE — Hospital Course (Signed)
[name] is a 51 year old resident of skilled nursing facility, admitted to the hospital with sepsis.  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  Etiology of sepsis is not entirely clear.  Records indicate that he was having vomiting prior to admission.  He may have also been having diarrhea.  CT abdomen does comment on possible gastroenteritis.  There is also mention of cystitis, though he does not make urine.  He has been started on empiric antibiotics.  Follow-up cultures.  Nephrology following for hemodialysis.

## 2021-08-18 NOTE — Progress Notes (Signed)
Patient refusing morning labs. MD notified

## 2021-08-18 NOTE — Progress Notes (Signed)
Called EMS to see status of transport.Per [place] [facility] trucks were backed up and will not be able to get patient before [name] center will not take patient after 9pm. EMS will attempt to get first thing in the morning. Left VM with legal guardian, [name] to return call. [place] to make aware.

## 2021-08-18 NOTE — Progress Notes (Addendum)
Patient ID: [name], male   DOB: 29-Jun-1970, 51 y.o.   MRN: [id number] S: no acute events. Tolerated HD yesterday w/ net UF 1.5L. Patient reports that his diarrhea is improving. O:BP (!) 165/79   Pulse 93   Temp 98.2 F (36.8 C)   Resp 18   Ht 6\' 1"  (1.854 m)   Wt 90.9 kg   SpO2 98%   BMI 26.44 kg/m   Intake/Output Summary (Last 24 hours) at 08/18/2021 1043 Last data filed at 08/18/2021 0600 Gross per 24 hour  Intake 586 ml  Output 1.5 ml  Net 584.5 ml   Intake/Output: I/O last 3 completed shifts: In: 926.1 [P.O.:476; IV Piggyback:450.1] Out: 1.5 [Other:1.5]  Intake/Output this shift:  No intake/output data recorded. Weight change:  Gen:NAD CVS: RRR Resp:CTA Abd:+BS, soft, NT/ND, obese Ext: no edema, RUE AVF+T/B  Recent Labs  Lab 08/16/21 0014 08/17/21 1213 08/17/21 1456  NA 138 133* 135  K 4.4 5.5* 5.3*  CL 98 97* 96*  CO2 24 21* 22  GLUCOSE 177* 244* 179*  BUN 71* 84* 85*  CREATININE 6.81* 8.39* 8.35*  ALBUMIN 3.1* 2.8* 2.6*  CALCIUM 8.6* 8.6* 8.4*  PHOS  --   --  5.9*  AST 21 28  --   ALT 38 45*  --    Liver Function Tests: Recent Labs  Lab 08/16/21 0014 08/17/21 1213 08/17/21 1456  AST 21 28  --   ALT 38 45*  --   ALKPHOS 187* 169*  --   BILITOT 0.5 0.5  --   PROT 7.5 6.9  --   ALBUMIN 3.1* 2.8* 2.6*   No results for input(s): "LIPASE", "AMYLASE" in the last 168 hours. Recent Labs  Lab 08/17/21 1213  AMMONIA 29   CBC: Recent Labs  Lab 08/16/21 0014 08/17/21 1213 08/17/21 1456  WBC 9.2 8.6 8.2  NEUTROABS 7.8* 5.5  --   HGB 11.0* 9.8* 9.0*  HCT 36.8* 32.2* 29.1*  MCV 97.1 96.4 95.4  PLT 213 211 203   Cardiac Enzymes: No results for input(s): "CKTOTAL", "CKMB", "CKMBINDEX", "TROPONINI" in the last 168 hours. CBG: Recent Labs  Lab 08/17/21 0736 08/17/21 1126 08/17/21 1658 08/17/21 2247 08/18/21 0707  GLUCAP 185* 228* 105* 261* 430*    Iron Studies: No results for input(s): "IRON", "TIBC", "TRANSFERRIN", "FERRITIN"  in the last 72 hours. Studies/Results: No results found.   apixaban  5 mg Oral BID   atorvastatin  10 mg Oral Daily   carvedilol  6.25 mg Oral Daily   Chlorhexidine Gluconate Cloth  6 each Topical Q0600   cinacalcet  30 mg Oral Q breakfast   ferrous sulfate  325 mg Oral Q breakfast   insulin aspart  0-15 Units Subcutaneous TID WC   insulin aspart  0-5 Units Subcutaneous QHS   insulin detemir  10 Units Subcutaneous QHS   pantoprazole  40 mg Oral Daily   risperiDONE  2 mg Oral BID   sevelamer carbonate  800 mg Oral TID WC    BMET    Component Value Date/Time   NA 135 08/17/2021 1456   K 5.3 (H) 08/17/2021 1456   CL 96 (L) 08/17/2021 1456   CO2 22 08/17/2021 1456   GLUCOSE 179 (H) 08/17/2021 1456   BUN 85 (H) 08/17/2021 1456   CREATININE 8.35 (H) 08/17/2021 1456   CREATININE 5.04 (H) 06/26/2013 0949   CALCIUM 8.4 (L) 08/17/2021 1456   CALCIUM 7.8 (L) 12/25/2007 0400   GFRNONAA 7 (L) 08/17/2021  [place] (L) 06/26/2013 0949   GFRAA 16 (L) 01/30/2019 0139   GFRAA 15 (L) 06/26/2013 0949   CBC    Component Value Date/Time   WBC 8.2 08/17/2021 1456   RBC 3.05 (L) 08/17/2021 1456   HGB 9.0 (L) 08/17/2021 1456   HCT 29.1 (L) 08/17/2021 1456   PLT 203 08/17/2021 1456   MCV 95.4 08/17/2021 1456   MCH 29.5 08/17/2021 1456   MCHC 30.9 08/17/2021 1456   RDW 16.7 (H) 08/17/2021 1456   LYMPHSABS 2.0 08/17/2021 1213   MONOABS 0.9 08/17/2021 1213   EOSABS 0.2 08/17/2021 1213   BASOSABS 0.0 08/17/2021 1213    Dialysis Orders: Center: [facility]  on MWF . EDW 84 kg HD Bath 2K/2.5Ca  Time 4 hours Heparin none. Access RIJ TDC and RUE AVF BFR 400 DFR 500        Assessment/Plan:  Sepsis - CT of abd/pelvis consistent with gastroenteritis.  on rocephin per primary service  ESRD -  will continue with HD on MWF schedule  Hypertension/volume  - stable  Anemia  - Hgb downtrending, down 9. Iron panel ordered to assess Fe/ESA needs  Metabolic bone disease -   continue with  home meds. Renal diet Uncontrolled DM2- per primary  Nutrition -  per primary  Gean Quint, MD [facility]

## 2021-08-18 NOTE — TOC Transition Note (Signed)
Transition of Care [facility]) - CM/SW Discharge Note   Patient Details  Name: [name] MRN: [id number] Date of Birth: Aug 26, 1970  Transition of Care [facility]) CM/SW Contact:  Boneta Lucks, RN Phone Number: 08/18/2021, 1:09 PM   Clinical Narrative:   Patient medically ready for discharge. Spoke with bradwell,julie ([place]) [phone]. Patient has Medicaid and approved to return to [facility]. Ebony Hail provided room number, RN calling report. TOC printed med necessity and will call EMS.   Final next level of care: Long Term Acute Care (LTAC) Barriers to Discharge: Continued Medical Work up   Patient Goals and CMS Choice Patient states their goals for this hospitalization and ongoing recovery are:: return to [facility].gov Compare Post Acute Care list provided to:: Patient Represenative (must comment)    Discharge Placement                Patient to be transferred to facility by: EMS Name of family member notified: Almyra Free Patient and family notified of of transfer: 08/18/21  Discharge Plan and [facility]               Readmission Risk Interventions    08/18/2021    1:07 PM  Readmission Risk Prevention Plan  Transportation Screening Complete  PCP or Specialist Appt within 3-5 Days Complete  HRI or [place] Complete  Social Work Consult for [place] Planning/Counseling Complete  Palliative Care Screening Not Applicable  Medication Review [employer]) Complete

## 2021-08-18 NOTE — Discharge Summary (Signed)
Physician Discharge Summary   Patient: [name] MRN: [id number] DOB: 10-17-1970  Admit date:     08/15/2021  Discharge date: 08/18/21  Discharge Physician: Deatra James   PCP: Caprice Renshaw, MD   Recommendations at discharge:   Follow-up with the PCP within a week, continue current recommended antibiotics, Hold stool softener, laxatives with any diarrhea Continue lactobacillus Reposition every 2 hours in bed Continue wound care, wound care team consultation at the facility  Discharge Diagnoses: Principal Problem:   Sepsis ([place]) Active Problems:   ESRD (end stage renal disease) on dialysis ([place])   Hypertension   UTI (urinary tract infection)   GERD (gastroesophageal reflux disease)   Diabetes mellitus type 2 in obese ([place])  Resolved Problems:   * No resolved hospital problems. *  Hospital Course: [name] is a 51 year old resident of skilled nursing facility, admitted to the hospital with sepsis.  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  Etiology of sepsis is not entirely clear.  Records indicate that he was having vomiting prior to admission.  He may have also been having diarrhea.  CT abdomen does comment on possible gastroenteritis.  There is also mention of cystitis, though he does not make urine.  He has been started on empiric antibiotics.  Follow-up cultures.  Nephrology following for hemodialysis.   Assessment and Plan:  Sepsis secondary to gastroenteritis, proctitis and urinary tract infection -Sepsis physiology is improving.   - COVID-19 negative.   -Chest x-ray showed small right-sided pleural effusion with vascular congestion.   - CT abdomen pelvis showed gastroenteritis, chronic atrophic kidneys with innumerable cyst, periportal edema and possible cystitis. GI Panel/C diff ordered.  - Continue IV Rocephin, add IV Flagyl >> switch to p.o. Cipro  and Flagyl   UTI (urinary tract infection) -Seen on CT scan.  Possibly chronic?Marland Kitchen  He is on dialysis.  was on IV Rocephin. -Switch to p.o. Cipro   Altered mental status -Mentation back to baseline - suspect from underlying infection.  CT head does not show any acute pathology.  Shows chronic changes. -TSH and ammonia level within normal limits   Diabetes mellitus type 2 in obese ([place]), insulin-dependent -A1c 9.3.  Resuming insulin regimen, diabetic diet   GERD (gastroesophageal reflux disease) -Protonix 40 mg orally daily   Left upper extremity DVT - Currently on Eliquis, outpatient medicine   Hypertension -Blood pressure parameters improved.  Continue Coreg   ESRD (end stage renal disease) on dialysis ([place]) Anemia of chronic disease -Hemodialysis per nephrology team.  On Sensipar, Renvela -Iron supplements -Due for hemodialysis tomorrow    Schizophrenia -Continue home medications-Risperdal   History of TBI, bedbound with quadriplegia - Frequent turning.  Supportive care.  Fall precautions.   Hyperlipidemia - Statin       DVT prophylaxis: Eliquis Code Status: Full code       Consultants: none  Procedures performed: none   Disposition: Skilled nursing facility Diet recommendation:  Discharge Diet Orders (From admission, onward)     Start     Ordered   08/18/21 0000  Diet - low sodium heart healthy        08/18/21 1155           Regular diet DISCHARGE MEDICATION: Allergies as of 08/18/2021       Reactions   Lactose Intolerance (gi) Diarrhea   Polymyxin B Swelling   "Can't take this eye drop," per  outside records.    Sulfamethoxazole Rash   "Swelling of area, face, from drop that had sulfa," per outside records.         Medication List     STOP taking these medications    docusate sodium 100 MG capsule Commonly known as: COLACE       TAKE these medications    ascorbic acid 500 MG tablet Commonly known as: VITAMIN C Take 1 tablet by  mouth 2 (two) times daily.   atorvastatin 10 MG tablet Commonly known as: LIPITOR Take 10 mg by mouth daily.   bisacodyl 5 MG EC tablet Commonly known as: DULCOLAX Take 10 mg by mouth daily.   carvedilol 6.25 MG tablet Commonly known as: COREG Take 6.25 mg by mouth 3 (three) times a week. Tues,thur, sat   cinacalcet 30 MG tablet Commonly known as: SENSIPAR Take 30 mg by mouth daily with breakfast.   ciprofloxacin 500 MG tablet Commonly known as: Cipro Take 1 tablet (500 mg total) by mouth 2 (two) times daily for 7 days.   diazepam 5 MG tablet Commonly known as: VALIUM Take 5 mg by mouth 3 (three) times a week. Tue, thurs, sat   Eliquis 5 MG Tabs tablet Generic drug: apixaban Take 5 mg by mouth 2 (two) times daily.   ferrous sulfate 325 (65 FE) MG tablet Take 1 tablet by mouth daily with breakfast.   insulin glargine-yfgn 100 UNIT/ML Pen Commonly known as: SEMGLEE Inject 15 Units into the skin in the morning and at bedtime.   insulin lispro 100 UNIT/ML injection Commonly known as: HUMALOG Inject 0-8 Units into the skin 3 (three) times daily before meals. If blood glucose less than 179, no lispro needed. If blood glucose 180-200, give 1 unit. If blood glucose 201-250, give 3 units. If blood glucose 251-300, give 5 units. If blood glucose 301-350, give 6 units. If blood glucose more than 351, give 8 units.   lactobacillus acidophilus & bulgar chewable tablet Chew 1 tablet by mouth 3 (three) times daily with meals for 10 days.   LORazepam 1 MG tablet Commonly known as: ATIVAN Take 1 mg by mouth 3 (three) times daily. For anxiety   metroNIDAZOLE 500 MG tablet Commonly known as: Flagyl Take 1 tablet (500 mg total) by mouth 3 (three) times daily for 7 days.   multivitamin Tabs tablet Take 1 tablet by mouth daily.   nitroGLYCERIN 0.4 MG SL tablet Commonly known as: NITROSTAT Place 0.4 mg under the tongue every 5 (five) minutes as needed for chest pain.   ondansetron  4 MG disintegrating tablet Commonly known as: ZOFRAN-ODT Take 4 mg by mouth every 6 (six) hours as needed for nausea or vomiting.   polyethylene glycol 17 g packet Commonly known as: MIRALAX / GLYCOLAX Take 17 g by mouth daily.   risperiDONE 2 MG tablet Commonly known as: RISPERDAL Take 2 mg by mouth 2 (two) times daily.   sevelamer carbonate 800 MG tablet Commonly known as: RENVELA Take 800 mg by mouth 3 (three) times daily.   sodium zirconium cyclosilicate 10 g Pack packet Commonly known as: LOKELMA Take 10 g by mouth daily.               Discharge Care Instructions  (From admission, onward)           Start     Ordered   08/18/21 0000  Discharge wound care:       Comments: Continue wound care per wound care nursing  instructions Reposition every 2 hours   08/18/21 1155            Discharge Exam: Filed Weights   08/15/21 2342 08/16/21 0613 08/17/21 1420  Weight: 88 kg 90.4 kg 90.9 kg      Physical Exam:   General:  AAO x 3,  cooperative, no distress; quadriplegic  HEENT:  Normocephalic, PERRL, otherwise with in Normal limits   Neuro:  Quadriplegic, cranial nerves intact, sensation and motor diminished in upper lower extremities  Lungs:   Clear to auscultation BL, Respirations unlabored,  No wheezes / crackles  Cardio:    S1/S2, RRR, No murmure, No Rubs or Gallops   Abdomen:  Soft, non-tender, bowel sounds active all four quadrants, no guarding or peritoneal signs.  Muscular  skeletal:  Limited exam -quadriplegic 2+ pulses,  symmetric, No pitting edema  Skin:  Dry, warm to touch, negative for any Rashes,  Wounds: Please see nursing documentation  Pressure Injury Sacrum Right Stage 1 -  Intact skin with non-blanchable redness of a localized area usually over a bony prominence. (Active)     Location: Sacrum  Location Orientation: Right  Staging: Stage 1 -  Intact skin with non-blanchable redness of a localized area usually over a bony  prominence.  Wound Description (Comments):   Present on Admission: Yes          Condition at discharge: good  The results of significant diagnostics from this hospitalization (including imaging, microbiology, ancillary and laboratory) are listed below for reference.   Imaging Studies: CT ABDOMEN PELVIS W CONTRAST  Result Date: 08/16/2021 CLINICAL DATA:  Dialysis patient with hyperglycemia and suspected sepsis. EXAM: CT ABDOMEN AND PELVIS WITH CONTRAST TECHNIQUE: Multidetector CT imaging of the abdomen and pelvis was performed using the standard protocol following bolus administration of intravenous contrast. RADIATION DOSE REDUCTION: This exam was performed according to the departmental dose-optimization program which includes automated exposure control, adjustment of the mA and/or kV according to patient size and/or use of iterative reconstruction technique. CONTRAST:  138mL OMNIPAQUE IOHEXOL 300 MG/ML  SOLN COMPARISON:  CT with IV contrast 01/23/2019 FINDINGS: Lower chest: There are scattered atelectatic bands in the right base. Left lung base is clear. There is new demonstration of a small layering right and trace left pleural effusions. There is bilateral moderate gynecomastia, increased since 2020. Trace pericardial fluid appears similar. The heart is borderline prominent with three-vessel coronary artery calcifications. The tip of an infusion or dialysis catheter is in the upper right atrium. Hepatobiliary: The liver is 21.8 cm in length with homogeneous enhancement. There is intrahepatic periportal edema most likely due to fluid overload, less likely hepatitis. There is trace pericholecystic fluid without gallbladder wall thickening. This is probably also due to fluid overload less likely early cholecystitis. No calcified stones or biliary dilatation. Pancreas: There is moderate pancreatic tail atrophy. The more proximal pancreas is unremarkable. There are no inflammatory changes or ductal  dilatation. No mass enhancement. Spleen: No focal abnormality or splenomegaly. Stomach/bowel: No bowel obstruction or inflammation is seen. There is a normal caliber appendix. There is a [place] nonobstructing transmesenteric internal hernia of the ascending mesocolon. There are thickened folds in the small bowel and stomach. There is circumferential thickening in the mid to distal rectum with stranding posterior to the rectum. Adrenals/urinary tract: There is no adrenal mass. Bilateral renal atrophy is noted chronically. 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 Largest is 2 cm on the left. Others are smaller and some  show trace calcifications. Many are too small to characterize. There are no urinary stones or obstruction. The bladder is thickened and there is perivesical stranding.This is increased since the prior study. Vascular/Lymphatic: There are extensive age-advanced vascular calcifications. There is no AAA. No adenopathy is seen. Reproductive: There is no prostatomegaly. Other: There is symmetric body wall edema in the posterior buttocks. There is trace ascites in the posterior deep pelvis. There is a small umbilical fat hernia. There is no incarcerated hernia. Musculoskeletal: There is osteopenia of the pelvis and proximal femurs, osteopenia but with dense endplates in the spine consistent with renal osteodystrophy. No regional fracture is seen. IMPRESSION: 1. Increased thickening and adjacent stranding involving the bladder, concerning for cystitis. 2. Increased thickening and dorsal stranding of the rectum which could be congestive wall thickening, proctitis or infiltrating disease. Consider follow-up colonoscopy when clinically feasible. 3. Gastroenteritis without evidence of small-bowel obstruction or inflammation. 4. Atrophic kidneys with innumerable cysts and too small to characterize hypodensities consistent with chronic dialysis. 5. Intrahepatic periportal  edema probably due to fluid overload, less likely hepatitis. 6. Minimal pericholecystic fluid most likely due to fluid overload, less likely cholecystitis. 7. Small right and trace left pleural effusions. No lung base infiltrates. 8. Increased changes of diffuse fibroglandular gynecomastia. 9. Aortic atherosclerosis and additional extensive vascular calcifications. Electronically Signed   By: Telford Nab M.D.   On: 08/16/2021 03:55   CT HEAD WO CONTRAST (5MM)  Result Date: 08/16/2021 CLINICAL DATA:  Hyperglycemia with delirium. EXAM: CT HEAD WITHOUT CONTRAST TECHNIQUE: Contiguous axial images were obtained from the base of the skull through the vertex without intravenous contrast. RADIATION DOSE REDUCTION: This exam was performed according to the departmental dose-optimization program which includes automated exposure control, adjustment of the mA and/or kV according to patient size and/or use of iterative reconstruction technique. COMPARISON:  Head CT 02/11/2021. FINDINGS: Brain: Imaging of the skull base is compromised by patient motion. The inferior cerebellar hemispheres are obscured by motion. There is moderate age-advanced cerebral atrophy with atrophic ventriculomegaly and moderate to severe small vessel disease of the cerebral white matter versus demyelinating disease. The visualized cerebellum and brainstem are relatively unremarkable. No asymmetry is seen concerning for an acute infarct, hemorrhage or mass. There is no midline shift. Benign dural calcifications are scattered along the falx. Vascular: There are calcifications in the carotid siphons but no hyperdense central vessels. Skull: No skull fracture or lesion is evident with poor visualization of the skull base due to patient motion. Sinuses/Orbits: No obvious sinus abnormality accounting for patient motion. There are bilateral chronic retinal and lens calcifications both globes, posteromedial dislocation of the left lens, and chronic  asymmetrically smaller size of the left globe. There is extensive fluid opacification of the right mastoid air cells, middle ear cavity. This was seen previously. Other: None. IMPRESSION: 1. No acute intracranial CT findings or changes. 2. Age-advanced atrophy and hypoattenuating small-vessel disease. 3. Diffuse fluid opacification of the right mastoid air cells and middle ear, seen previously. Electronically Signed   By: Telford Nab M.D.   On: 08/16/2021 03:32   DG Chest Port 1 View  Result Date: 08/16/2021 CLINICAL DATA:  Nausea vomiting high blood sugar EXAM: PORTABLE CHEST 1 VIEW COMPARISON:  03/25/2021 FINDINGS: Right-sided central venous catheter tips over the right atrium. Small right-sided pleural effusion. Vascular stent beneath the right clavicle. No consolidation or pneumothorax. Stable cardiomediastinal silhouette with vascular congestion. IMPRESSION: 1. Small right-sided pleural effusion 2. Vascular congestion Electronically Signed   By: Maudie Mercury  Francoise Ceo M.D.   On: 08/16/2021 00:29    Microbiology: Results for orders placed or performed during the hospital encounter of 08/15/21  Blood Culture (routine x 2)     Status: None (Preliminary result)   Collection Time: 08/16/21 12:14 AM   Specimen: BLOOD RIGHT FOREARM  Result Value Ref Range Status   Specimen Description BLOOD RIGHT FOREARM  Final   Special Requests   Final    BOTTLES DRAWN AEROBIC AND ANAEROBIC Blood Culture results may not be optimal due to an excessive volume of blood received in culture bottles   Culture   Final    NO GROWTH 2 DAYS Performed at [facility], [address], [place], [place] [address]    Report Status PENDING  Incomplete  Blood Culture (routine x 2)     Status: None (Preliminary result)   Collection Time: 08/16/21 12:14 AM   Specimen: Right Antecubital; Blood  Result Value Ref Range Status   Specimen Description RIGHT ANTECUBITAL  Final   Special Requests   Final    BOTTLES DRAWN AEROBIC AND  ANAEROBIC Blood Culture adequate volume   Culture   Final    NO GROWTH 2 DAYS Performed at [facility], [address] [place],  [address]    Report Status PENDING  Incomplete  Resp Panel by RT-PCR (Flu A&B, Covid) Anterior Nasal Swab     Status: None   Collection Time: 08/16/21 12:15 AM   Specimen: Anterior Nasal Swab  Result Value Ref Range Status   SARS Coronavirus 2 by RT PCR NEGATIVE NEGATIVE Final    Comment: (NOTE) SARS-CoV-2 target nucleic acids are NOT DETECTED.  The SARS-CoV-2 RNA is generally detectable in upper respiratory specimens during the acute phase of infection. The lowest concentration of SARS-CoV-2 viral copies this assay can detect is 138 copies/mL. A negative result does not preclude SARS-Cov-2 infection and should not be used as the sole basis for treatment or other patient management decisions. A negative result may occur with  improper specimen collection/handling, submission of specimen other than nasopharyngeal swab, presence of viral mutation(s) within the areas targeted by this assay, and inadequate number of viral copies(<138 copies/mL). A negative result must be combined with clinical observations, patient history, and epidemiological information. The expected result is Negative.  Fact Sheet for Patients:  EntrepreneurPulse.com.au  Fact Sheet for Healthcare Providers:  IncredibleEmployment.be  This test is no t yet approved or cleared by the [place] FDA and  has been authorized for detection and/or diagnosis of SARS-CoV-2 by FDA under an Emergency Use Authorization (EUA). This EUA will remain  in effect (meaning this test can be used) for the duration of the COVID-19 declaration under Section 564(b)(1) of the Act, 21 U.S.C.section 360bbb-3(b)(1), unless the authorization is terminated  or revoked sooner.       Influenza A by PCR NEGATIVE NEGATIVE Final   Influenza B by PCR NEGATIVE NEGATIVE  Final    Comment: (NOTE) The Xpert Xpress SARS-CoV-2/FLU/RSV plus assay is intended as an aid in the diagnosis of influenza from Nasopharyngeal swab specimens and should not be used as a sole basis for treatment. Nasal washings and aspirates are unacceptable for Xpert Xpress SARS-CoV-2/FLU/RSV testing.  Fact Sheet for Patients: EntrepreneurPulse.com.au  Fact Sheet for Healthcare Providers: IncredibleEmployment.be  This test is not yet approved or cleared by the [place] FDA and has been authorized for detection and/or diagnosis of SARS-CoV-2 by FDA under an Emergency Use Authorization (EUA). This EUA will remain in effect (meaning  this test can be used) for the duration of the COVID-19 declaration under Section 564(b)(1) of the Act, 21 U.S.C. section 360bbb-3(b)(1), unless the authorization is terminated or revoked.  Performed at [facility], [address] [place], [place] [address]   MRSA Next Gen by PCR, Nasal     Status: None   Collection Time: 08/16/21  6:04 AM   Specimen: Nasal Mucosa; Nasal Swab  Result Value Ref Range Status   MRSA by PCR Next Gen NOT DETECTED NOT DETECTED Final    Comment: (NOTE) The GeneXpert MRSA Assay (FDA approved for NASAL specimens only), is one component of a comprehensive MRSA colonization surveillance program. It is not intended to diagnose MRSA infection nor to guide or monitor treatment for MRSA infections. Test performance is not FDA approved in patients less than 64 years old. Performed at [facility], [address] [place], [place] [address]     Labs: CBC: Recent Labs  Lab 08/16/21 0014 08/17/21 1213 08/17/21 1456  WBC 9.2 8.6 8.2  NEUTROABS 7.8* 5.5  --   HGB 11.0* 9.8* 9.0*  HCT 36.8* 32.2* 29.1*  MCV 97.1 96.4 95.4  PLT 213 211 681   Basic Metabolic Panel: Recent Labs  Lab 08/16/21 0014 08/17/21 1213 08/17/21 1456  NA 138 133* 135  K 4.4 5.5* 5.3*  CL 98 97* 96*  CO2 24  21* 22  GLUCOSE 177* 244* 179*  BUN 71* 84* 85*  CREATININE 6.81* 8.39* 8.35*  CALCIUM 8.6* 8.6* 8.4*  MG  --  2.1  --   PHOS  --   --  5.9*   Liver Function Tests: Recent Labs  Lab 08/16/21 0014 08/17/21 1213 08/17/21 1456  AST 21 28  --   ALT 38 45*  --   ALKPHOS 187* 169*  --   BILITOT 0.5 0.5  --   PROT 7.5 6.9  --   ALBUMIN 3.1* 2.8* 2.6*   CBG: Recent Labs  Lab 08/17/21 0736 08/17/21 1126 08/17/21 1658 08/17/21 2247 08/18/21 0707  GLUCAP 185* 228* 105* 261* 430*    Discharge time spent: greater than 30 minutes.  Signed: Deatra James, MD [facility] 08/18/2021

## 2021-08-19 ENCOUNTER — Telehealth (INDEPENDENT_AMBULATORY_CARE_PROVIDER_SITE_OTHER): Payer: Self-pay

## 2021-08-19 NOTE — Progress Notes (Signed)
IV cipro held d/t loss of IV access. MD aware. Pt to start Cipro in the morning as prescribed per MD instruction.

## 2021-08-19 NOTE — Progress Notes (Signed)
[facility] called stating that [facility] has agreed to accept patient back at this time since he was not a new admission. Patient left facility in stable condition at approx 0330 via stretcher. Paperwork given to EMS.

## 2021-08-19 NOTE — Telephone Encounter (Signed)
A fax was received to have the patient scheduled for a permcath removal. I call [facility] and spoke with Sharyn Lull and the patient has a permcath and a access in his arm that is not working at the point. I advised that I received a fax to have the permcath removed and she wanted to talk with [facility] regarding taking out the patient's permcath as he has no other way of dialyzing. Sharyn Lull stated she would call back to let me know if it will be removed or not.

## 2021-08-21 LAB — CULTURE, BLOOD (ROUTINE X 2)
Culture: NO GROWTH
Culture: NO GROWTH
Special Requests: ADEQUATE

## 2021-08-25 ENCOUNTER — Other Ambulatory Visit: Payer: Self-pay

## 2021-08-25 ENCOUNTER — Emergency Department (HOSPITAL_COMMUNITY)
Admission: EM | Admit: 2021-08-25 | Discharge: 2021-08-25 | Disposition: A | Discharge status: Skilled Nursing Facility | Payer: Medicaid Other | Attending: Emergency Medicine | Admitting: Emergency Medicine

## 2021-08-25 ENCOUNTER — Encounter (HOSPITAL_COMMUNITY): Payer: Self-pay

## 2021-08-25 ENCOUNTER — Encounter
Admission: RE | Disposition: A | Discharge status: Other Acute Inpatient Hospital | Payer: Self-pay | Source: Home / Self Care | Attending: Vascular Surgery

## 2021-08-25 ENCOUNTER — Ambulatory Visit
Admission: RE | Admit: 2021-08-25 | Discharge: 2021-08-25 | Disposition: A | Discharge status: Other Acute Inpatient Hospital | Payer: Medicaid Other | Attending: Vascular Surgery | Admitting: Vascular Surgery

## 2021-08-25 DIAGNOSIS — I120 Hypertensive chronic kidney disease with stage 5 chronic kidney disease or end stage renal disease: Secondary | ICD-10-CM

## 2021-08-25 DIAGNOSIS — R531 Weakness: Secondary | ICD-10-CM | POA: Diagnosis present

## 2021-08-25 DIAGNOSIS — T8249XA Other complication of vascular dialysis catheter, initial encounter: Secondary | ICD-10-CM

## 2021-08-25 DIAGNOSIS — I2510 Atherosclerotic heart disease of native coronary artery without angina pectoris: Secondary | ICD-10-CM

## 2021-08-25 DIAGNOSIS — E1122 Type 2 diabetes mellitus with diabetic chronic kidney disease: Secondary | ICD-10-CM | POA: Insufficient documentation

## 2021-08-25 DIAGNOSIS — Z992 Dependence on renal dialysis: Secondary | ICD-10-CM | POA: Diagnosis not present

## 2021-08-25 DIAGNOSIS — Z794 Long term (current) use of insulin: Secondary | ICD-10-CM | POA: Insufficient documentation

## 2021-08-25 DIAGNOSIS — N186 End stage renal disease: Secondary | ICD-10-CM | POA: Insufficient documentation

## 2021-08-25 DIAGNOSIS — R251 Tremor, unspecified: Secondary | ICD-10-CM | POA: Diagnosis not present

## 2021-08-25 DIAGNOSIS — Z7901 Long term (current) use of anticoagulants: Secondary | ICD-10-CM | POA: Diagnosis not present

## 2021-08-25 DIAGNOSIS — G5139 Clonic hemifacial spasm, unspecified: Secondary | ICD-10-CM

## 2021-08-25 DIAGNOSIS — M62838 Other muscle spasm: Secondary | ICD-10-CM | POA: Insufficient documentation

## 2021-08-25 DIAGNOSIS — Z79899 Other long term (current) drug therapy: Secondary | ICD-10-CM | POA: Diagnosis not present

## 2021-08-25 DIAGNOSIS — Z95828 Presence of other vascular implants and grafts: Secondary | ICD-10-CM

## 2021-08-25 HISTORY — PX: DIALYSIS/PERMA CATHETER REMOVAL: CATH118289

## 2021-08-25 LAB — GLUCOSE, CAPILLARY
Glucose-Capillary: 56 mg/dL — ABNORMAL LOW (ref 70–99)
Glucose-Capillary: 65 mg/dL — ABNORMAL LOW (ref 70–99)

## 2021-08-25 LAB — CBC WITH DIFFERENTIAL/PLATELET
Abs Immature Granulocytes: 0.02 10*3/uL (ref 0.00–0.07)
Basophils Absolute: 0 10*3/uL (ref 0.0–0.1)
Basophils Relative: 1 %
Eosinophils Absolute: 0.1 10*3/uL (ref 0.0–0.5)
Eosinophils Relative: 3 %
HCT: 33.7 % — ABNORMAL LOW (ref 39.0–52.0)
Hemoglobin: 10.1 g/dL — ABNORMAL LOW (ref 13.0–17.0)
Immature Granulocytes: 0 %
Lymphocytes Relative: 27 %
Lymphs Abs: 1.4 10*3/uL (ref 0.7–4.0)
MCH: 29.4 pg (ref 26.0–34.0)
MCHC: 30 g/dL (ref 30.0–36.0)
MCV: 98 fL (ref 80.0–100.0)
Monocytes Absolute: 0.7 10*3/uL (ref 0.1–1.0)
Monocytes Relative: 12 %
Neutro Abs: 3.1 10*3/uL (ref 1.7–7.7)
Neutrophils Relative %: 57 %
Platelets: 271 10*3/uL (ref 150–400)
RBC: 3.44 MIL/uL — ABNORMAL LOW (ref 4.22–5.81)
RDW: 16.9 % — ABNORMAL HIGH (ref 11.5–15.5)
WBC: 5.4 10*3/uL (ref 4.0–10.5)
nRBC: 0 % (ref 0.0–0.2)

## 2021-08-25 LAB — COMPREHENSIVE METABOLIC PANEL
ALT: 29 U/L (ref 0–44)
AST: 24 U/L (ref 15–41)
Albumin: 3.1 g/dL — ABNORMAL LOW (ref 3.5–5.0)
Alkaline Phosphatase: 168 U/L — ABNORMAL HIGH (ref 38–126)
Anion gap: 13 (ref 5–15)
BUN: 47 mg/dL — ABNORMAL HIGH (ref 6–20)
CO2: 29 mmol/L (ref 22–32)
Calcium: 8.6 mg/dL — ABNORMAL LOW (ref 8.9–10.3)
Chloride: 97 mmol/L — ABNORMAL LOW (ref 98–111)
Creatinine, Ser: 7.1 mg/dL — ABNORMAL HIGH (ref 0.61–1.24)
GFR, Estimated: 9 mL/min — ABNORMAL LOW (ref >60)
Glucose, Bld: 171 mg/dL — ABNORMAL HIGH (ref 70–99)
Potassium: 4.6 mmol/L (ref 3.5–5.1)
Sodium: 139 mmol/L (ref 135–145)
Total Bilirubin: 0.4 mg/dL (ref 0.3–1.2)
Total Protein: 7.5 g/dL (ref 6.5–8.1)

## 2021-08-25 LAB — MAGNESIUM: Magnesium: 2.4 mg/dL (ref 1.7–2.4)

## 2021-08-25 SURGERY — DIALYSIS/PERMA CATHETER REMOVAL
Anesthesia: LOCAL

## 2021-08-25 MED ORDER — LIDOCAINE-EPINEPHRINE (PF) 1 %-1:200000 IJ SOLN
INTRAMUSCULAR | Status: DC | PRN
  Administered 2021-08-25: 20 mL via INTRADERMAL

## 2021-08-25 MED ORDER — CALCIUM CARBONATE ANTACID 500 MG PO CHEW: 1.0000 | CHEWABLE_TABLET | Freq: Every day | ORAL | 0 refills | Status: AC

## 2021-08-25 SURGICAL SUPPLY — 4 items
APL PRP STRL LF DISP 70% ISPRP (MISCELLANEOUS) ×2
CHLORAPREP W/TINT 26 (MISCELLANEOUS) ×2 IMPLANT
FORCEPS HALSTEAD CVD 5IN STRL (INSTRUMENTS) ×1 IMPLANT
TRAY LACERAT/PLASTIC (MISCELLANEOUS) ×1 IMPLANT

## 2021-08-25 NOTE — ED Triage Notes (Signed)
Patient brought in by [facility] for twitching and not feeling well.  Patient states that he felt weak since this morning and describes it as feeling more tired.  Patient had a PICC line removed earlier today at [facility] and missed his dialysis.  EMS states that he is scheduled to have it tomorrow. Patient is conscious and alert at triage.

## 2021-08-25 NOTE — Discharge Instructions (Signed)
Your electrolytes are normal today except for your calcium which is little bit low, this could be the source of your muscle spasm.  You are being prescribed a calcium tablet to take daily for the next 7 days to see if this will help your symptoms.  Follow-up with your primary doctor if your symptoms persist.

## 2021-08-25 NOTE — ED Provider Notes (Signed)
[facility] EMERGENCY DEPARTMENT Provider Note   CSN: [id number] Arrival date & time: 08/25/21  1515     History  Chief Complaint  Patient presents with   Weakness    PACO CISLO is a 51 y.o. male with a history of end-stage renal disease, Monday Wednesday Friday dialysis, hypertension, quadriparesis, GERD, type 2 diabetes presenting from his nursing facility secondary to facial twitching and "just not feeling well", describing generalized weakness and fatigue since this morning.  He was seen at [facility] earlier today where he had a PICC line removed as it was no longer being used for dialysis as he has a functioning graft in his right arm.  He is scheduled for dialysis tomorrow.  The history is provided by the patient and the nursing home.       Home Medications Prior to Admission medications   Medication Sig Start Date End Date Taking? Authorizing Provider  calcium carbonate (TUMS) 500 MG chewable tablet Chew 1 tablet (200 mg of elemental calcium total) by mouth daily. 08/25/21  Yes Coda Filler, Almyra Free, PA-C  apixaban (ELIQUIS) 5 MG TABS tablet Take 5 mg by mouth 2 (two) times daily.    [provider]  ascorbic acid (VITAMIN C) 500 MG tablet Take 1 tablet by mouth 2 (two) times daily.    [provider]  atorvastatin (LIPITOR) 10 MG tablet Take 10 mg by mouth daily. 11/10/20   [provider]  bisacodyl (DULCOLAX) 5 MG EC tablet Take 10 mg by mouth daily. 09/02/20   [provider]  carvedilol (COREG) 6.25 MG tablet Take 6.25 mg by mouth 3 (three) times a week. Tues,thur, sat    [provider]  cinacalcet (SENSIPAR) 30 MG tablet Take 30 mg by mouth daily with breakfast.    [provider]  ciprofloxacin (CIPRO) 500 MG tablet Take 1 tablet (500 mg total) by mouth 2 (two) times daily for 7 days. 08/18/21 08/25/21  Shahmehdi, Valeria Batman, MD  diazepam (VALIUM) 5 MG tablet Take 5 mg by mouth 3 (three) times a week. Tue, thurs, sat     [provider]  ferrous sulfate 325 (65 FE) MG tablet Take 1 tablet by mouth daily with breakfast. 11/15/20   [provider]  insulin glargine-yfgn (SEMGLEE) 100 UNIT/ML Pen Inject 15 Units into the skin in the morning and at bedtime. 04/21/21   [provider]  insulin lispro (HUMALOG) 100 UNIT/ML injection Inject 0-8 Units into the skin 3 (three) times daily before meals. If blood glucose less than 179, no lispro needed. If blood glucose 180-200, give 1 unit. If blood glucose 201-250, give 3 units. If blood glucose 251-300, give 5 units. If blood glucose 301-350, give 6 units. If blood glucose more than 351, give 8 units.    [provider]  lactobacillus acidophilus & bulgar (LACTINEX) chewable tablet Chew 1 tablet by mouth 3 (three) times daily with meals for 10 days. 08/18/21 08/28/21  Shahmehdi, Valeria Batman, MD  LORazepam (ATIVAN) 1 MG tablet Take 1 mg by mouth 3 (three) times daily. For anxiety     [provider]  metroNIDAZOLE (FLAGYL) 500 MG tablet Take 1 tablet (500 mg total) by mouth 3 (three) times daily for 7 days. 08/18/21 08/25/21  Deatra James, MD  multivitamin (RENA-VIT) TABS tablet Take 1 tablet by mouth daily.      [provider]  nitroGLYCERIN (NITROSTAT) 0.4 MG SL tablet Place 0.4 mg under the tongue every 5 (five) minutes  as needed for chest pain.    [provider]  ondansetron (ZOFRAN-ODT) 4 MG disintegrating tablet Take 4 mg by mouth every 6 (six) hours as needed for nausea or vomiting.    [provider]  polyethylene glycol (MIRALAX / GLYCOLAX) 17 g packet Take 17 g by mouth daily.    [provider]  risperiDONE (RISPERDAL) 2 MG tablet Take 2 mg by mouth 2 (two) times daily.      [provider]  sevelamer (RENVELA) 800 MG tablet Take 800 mg by mouth 3 (three) times daily.      [provider]  sodium zirconium cyclosilicate (LOKELMA) 10 g PACK packet Take 10 g by mouth daily.     [provider]      Allergies    Lactose intolerance (gi), Polymyxin b, and Sulfamethoxazole    Review of Systems   Review of Systems  Constitutional:  Negative for chills and fever.  HENT:  Negative for congestion and sore throat.   Eyes: Negative.   Respiratory:  Negative for chest tightness and shortness of breath.   Cardiovascular:  Negative for chest pain.  Gastrointestinal:  Negative for abdominal pain and nausea.  Genitourinary: Negative.   Musculoskeletal:  Negative for arthralgias, joint swelling and neck pain.  Skin: Negative.  Negative for rash and wound.  Neurological:  Positive for tremors. Negative for dizziness, weakness, light-headedness, numbness and headaches.       Facial tremor/tic.  Psychiatric/Behavioral: Negative.    All other systems reviewed and are negative.   Physical Exam Updated Vital Signs BP (!) 180/97   Pulse 100   Temp (!) 97.5 F (36.4 C) (Oral)   Resp 13   SpO2 99%  Physical Exam Vitals and nursing note reviewed.  Constitutional:      Appearance: He is well-developed.  HENT:     Head: Normocephalic and atraumatic.  Eyes:     Conjunctiva/sclera: Conjunctivae normal.  Cardiovascular:     Rate and Rhythm: Normal rate and regular rhythm.     Heart sounds: Normal heart sounds.  Pulmonary:     Effort: Pulmonary effort is normal.     Breath sounds: Normal breath sounds. No wheezing.  Abdominal:     General: Bowel sounds are normal.     Palpations: Abdomen is soft.     Tenderness: There is no abdominal tenderness.  Musculoskeletal:        General: Normal range of motion.     Cervical back: Normal range of motion.  Skin:    General: Skin is warm and dry.  Neurological:     Mental Status: He is alert.     ED Results / Procedures / Treatments   Labs (all labs ordered are listed, but only abnormal results are displayed) Labs Reviewed  CBC WITH DIFFERENTIAL/PLATELET - Abnormal; Notable for the following components:       Result Value   RBC 3.44 (*)    Hemoglobin 10.1 (*)    HCT 33.7 (*)    RDW 16.9 (*)    All other components within normal limits  COMPREHENSIVE METABOLIC PANEL - Abnormal; Notable for the following components:   Chloride 97 (*)    Glucose, Bld 171 (*)    BUN 47 (*)    Creatinine, Ser 7.10 (*)    Calcium 8.6 (*)    Albumin 3.1 (*)    Alkaline Phosphatase 168 (*)    GFR, Estimated 9 (*)    All other components within normal  limits  MAGNESIUM    EKG None  Radiology PERIPHERAL VASCULAR CATHETERIZATION  Result Date: 08/25/2021 See surgical note for result.   Procedures Procedures    Medications Ordered in ED Medications - No data to display  ED Course/ Medical Decision Making/ A&P                           Medical Decision Making Patient with a less than 24-hour history of facial spasm/muscle twitching of unclear etiology.  He does have a mild hypocalcemia at 8.6, review of the chart indicates that he is fairly chronically consistently low so is unclear if this is the source of his symptoms.  However other electrolytes appear within normal range.  He will be given a low-dose calcium chew to take for the next week to see if that improves his symptoms.  Advised to follow-up with his PCP if symptoms are not better with this additional calcium.  Amount and/or Complexity of Data Reviewed Labs: ordered.  Risk OTC drugs.           Final Clinical Impression(s) / ED Diagnoses Final diagnoses:  Facial spasm    Rx / DC Orders ED Discharge Orders          Ordered    calcium carbonate (TUMS) 500 MG chewable tablet  Daily        08/25/21 1901              Landis Martins 08/25/21 1903    Luna Fuse, MD 08/30/21 [phone]

## 2021-08-25 NOTE — ED Notes (Signed)
Attempted to call report to facility, no answer.  

## 2021-08-25 NOTE — ED Notes (Signed)
Attempted to contact patients legal guardian x 3.  Unable to leave a voicemail.

## 2021-08-25 NOTE — Progress Notes (Signed)
BG: 60. Pt. Asymptomatic. Pt. Given 2 o.j. with packet of sugar. Pt. Personal caregiver feeding pt. Now : sandwich, chips, fig newton & diet gingerale. Pt. States "I'm really hungry."

## 2021-08-25 NOTE — Discharge Instructions (Signed)
Tunneled Catheter Removal, Care After Refer to this sheet in the next few weeks. These instructions provide you with information about caring for yourself after your procedure. Your health care provider may also give you more specific instructions. Your treatment has been planned according to current medical practices, but problems sometimes occur. Call your health care provider if you have any problems or questions after your procedure. What can I expect after the procedure? After the procedure, it is common to have: Some mild redness, swelling, and pain around your catheter site.   Follow these instructions at home: Incision care  Check your removal site  every day for signs of infection. Check for: More redness, swelling, or pain. More fluid or blood. Warmth. Pus or a bad smell. Remove your dressing in 48hrs leave open to air  Activity  Return to your normal activities as told by your health care provider. Ask your health care provider what activities are safe for you. Do not lift anything that is heavier than 10 lb (4.5 kg) for 3 days  You may shower tomorrow  Contact a health care provider if: You have more fluid or blood coming from your removal site You have more redness, swelling, or pain at your incisions or around the area where your catheter was removed Your removal site feel warm to the touch. You feel unusually weak. You feel nauseous.. Get help right away if You have swelling in your arm, shoulder, neck, or face. You develop chest pain. You have difficulty breathing. You feel dizzy or light-headed. You have pus or a bad smell coming from your removal site You have a fever. You develop bleeding from your removal site, and your bleeding does not stop. This information is not intended to replace advice given to you by your health care provider. Make sure you discuss any questions you have with your health care provider. Document Released: 01/10/2012 Document Revised:  09/26/2015 Document Reviewed: 10/19/2014 Elsevier Interactive Patient Education  2017 [employer] 

## 2021-08-25 NOTE — Op Note (Addendum)
Operative Note     Preoperative diagnosis:   1. ESRD with functional permanent access  Postoperative diagnosis:  1. ESRD with functional permanent access  Procedure:  Removal of right jugular Permcath  Surgeon:  Leotis Pain, MD  Assistant:  Eulogio Ditch, FNP  Anesthesia:  Local  EBL:  Minimal  Indication for the Procedure:  The patient has a functional permanent dialysis access and no longer needs their permcath.  This can be removed.  Risks and benefits are discussed and informed consent is obtained.  Description of the Procedure:  The patient's right neck, chest and existing catheter were sterilely prepped and draped. The area around the catheter was anesthetized copiously with 1% lidocaine. The catheter was dissected out with curved hemostats until the cuff was freed from the surrounding fibrous sheath. The fiber sheath was transected, and the catheter was then removed in its entirety using gentle traction. Pressure was held and sterile dressings were placed. The patient tolerated the procedure well and was taken to the recovery room in stable condition.     Leotis Pain  08/25/2021, 10:45 AM This note was created with Dragon Medical transcription system. Any errors in dictation are purely unintentional.

## 2021-08-25 NOTE — H&P (Signed)
[facility] Admission History & Physical  MRN : [id number]  [name] is a 51 y.o. (08-05-1970) male who presents with chief complaint of No chief complaint on file. Marland Kitchen  History of Present Illness: I am asked to evaluate the patient by the dialysis center. The patient was sent here because they have a nonfunctioning tunneled catheter and a functioning extremity access.  The patient reports they're not been any problems with any of their dialysis runs. They are reporting good flows with good parameters at dialysis.   Patient denies pain or tenderness overlying the access.  There is no pain with dialysis.  The patient denies hand pain or finger pain consistent with steal syndrome.  No fevers or chills while on dialysis.    No current facility-administered medications for this encounter.    Past Medical History:  Diagnosis Date   Acute respiratory failure with hypoxia (HCC)    Anemia    Chest pain    Constipation    Depression    Diabetes mellitus    ESRD (end stage renal disease) on dialysis [facility])    "[place] off [address]; MWF" (02/25/2015)   HCAP (healthcare-associated pneumonia)    Hypertension    Hyperthyroidism    Hyperthyroidism    Quadriparesis (muscle weakness)    Schizo-affective schizophrenia ([place])     Past Surgical History:  Procedure Laterality Date   DIALYSIS FISTULA CREATION     MULTIPLE EXTRACTIONS WITH ALVEOLOPLASTY  01/17/2011   Procedure: MULTIPLE EXTRACION WITH ALVEOLOPLASTY;  Surgeon: Gae Bon;  Location: [facility] OR;  Service: Oral Surgery;  Laterality: Bilateral;  Extraction of number one, sixteen, seventeen, and thirty two.      Social History   Tobacco Use   Smoking status: Never  Vaping Use   Vaping Use: Never used  Substance Use Topics   Alcohol use: No   Drug use: No     Family History  Family history unknown: Yes    No family history of bleeding or clotting disorders, autoimmune disease or  porphyria  Allergies  Allergen Reactions   Lactose Intolerance (Gi) Diarrhea   Polymyxin B Swelling    "Can't take this eye drop," per outside records.    Sulfamethoxazole Rash    "Swelling of area, face, from drop that had sulfa," per outside records.      REVIEW OF SYSTEMS (Negative unless checked)  Constitutional: [] Weight loss  [] Fever  [] Chills Cardiac: [] Chest pain   [] Chest pressure   [] Palpitations   [] Shortness of breath when laying flat   [] Shortness of breath at rest   [x] Shortness of breath with exertion. Vascular:  [] Pain in legs with walking   [] Pain in legs at rest   [] Pain in legs when laying flat   [] Claudication   [] Pain in feet when walking  [] Pain in feet at rest  [] Pain in feet when laying flat   [] History of DVT   [] Phlebitis   [] Swelling in legs   [] Varicose veins   [] Non-healing ulcers Pulmonary:   [] Uses home oxygen   [] Productive cough   [] Hemoptysis   [] Wheeze  [] COPD   [] Asthma Neurologic:  [] Dizziness  [] Blackouts   [] Seizures   [] History of stroke   [] History of TIA  [] Aphasia   [] Temporary blindness   [] Dysphagia   [] Weakness or numbness in arms   [] Weakness or numbness in legs Musculoskeletal:  [] Arthritis   [] Joint swelling   [] Joint pain   [] Low back pain Hematologic:  [] Easy  bruising  [] Easy bleeding   [] Hypercoagulable state   [x] Anemic  [] Hepatitis Gastrointestinal:  [] Blood in stool   [] Vomiting blood  [] Gastroesophageal reflux/heartburn   [] Difficulty swallowing. Genitourinary:  [x] Chronic kidney disease   [] Difficult urination  [] Frequent urination  [] Burning with urination   [] Blood in urine Skin:  [] Rashes   [] Ulcers   [] Wounds Psychological:  [] History of anxiety   []  History of major depression.  Physical Examination  There were no vitals filed for this visit. There is no height or weight on file to calculate BMI. Gen: WD/WN, NAD Head: [place]/AT, No temporalis wasting. Prominent temp pulse not noted. Ear/Nose/Throat: Hearing grossly intact,  nares w/o erythema or drainage, oropharynx w/o Erythema/Exudate,  Eyes: Conjunctiva clear, sclera non-icteric Neck: Trachea midline.  No JVD.  Pulmonary:  Good air movement, respirations not labored, no use of accessory muscles.  Cardiac: RRR, normal S1, S2. Vascular: good thrill in AVF Vessel Right Left  Radial Palpable Palpable   Musculoskeletal: M/S 5/5 throughout.  Extremities without ischemic changes.  No deformity or atrophy.  Neurologic: Sensation grossly intact in extremities.  Symmetrical.  Speech is fluent. Motor exam as listed above. Psychiatric: Judgment intact, Mood & affect appropriate for pt's clinical situation. Dermatologic: No rashes or ulcers noted.  No cellulitis or open wounds.    CBC Lab Results  Component Value Date   WBC 8.2 08/17/2021   HGB 9.0 (L) 08/17/2021   HCT 29.1 (L) 08/17/2021   MCV 95.4 08/17/2021   PLT 203 08/17/2021    BMET    Component Value Date/Time   NA 135 08/17/2021 1456   K 5.3 (H) 08/17/2021 1456   CL 96 (L) 08/17/2021 1456   CO2 22 08/17/2021 1456   GLUCOSE 179 (H) 08/17/2021 1456   BUN 85 (H) 08/17/2021 1456   CREATININE 8.35 (H) 08/17/2021 1456   CREATININE 5.04 (H) 06/26/2013 0949   CALCIUM 8.4 (L) 08/17/2021 1456   CALCIUM 7.8 (L) 12/25/2007 0400   GFRNONAA 7 (L) 08/17/2021 1456   GFRNONAA 13 (L) 06/26/2013 0949   GFRAA 16 (L) 01/30/2019 0139   GFRAA 15 (L) 06/26/2013 0949   Estimated Creatinine Clearance: 12.1 mL/min (A) (by C-G formula based on SCr of 8.35 mg/dL (H)).  COAG Lab Results  Component Value Date   INR 1.3 (H) 08/16/2021   INR 1.2 08/16/2021   INR 1.1 03/29/2020    Radiology CT ABDOMEN PELVIS W CONTRAST  Result Date: 08/16/2021 CLINICAL DATA:  Dialysis patient with hyperglycemia and suspected sepsis. EXAM: CT ABDOMEN AND PELVIS WITH CONTRAST TECHNIQUE: Multidetector CT imaging of the abdomen and pelvis was performed using the standard protocol following bolus administration of intravenous contrast.  RADIATION DOSE REDUCTION: This exam was performed according to the departmental dose-optimization program which includes automated exposure control, adjustment of the mA and/or kV according to patient size and/or use of iterative reconstruction technique. CONTRAST:  147mL OMNIPAQUE IOHEXOL 300 MG/ML  SOLN COMPARISON:  CT with IV contrast 01/23/2019 FINDINGS: Lower chest: There are scattered atelectatic bands in the right base. Left lung base is clear. There is new demonstration of a small layering right and trace left pleural effusions. There is bilateral moderate gynecomastia, increased since 2020. Trace pericardial fluid appears similar. The heart is borderline prominent with three-vessel coronary artery calcifications. The tip of an infusion or dialysis catheter is in the upper right atrium. Hepatobiliary: The liver is 21.8 cm in length with homogeneous enhancement. There is intrahepatic periportal edema most likely due to fluid overload, less likely  hepatitis. There is trace pericholecystic fluid without gallbladder wall thickening. This is probably also due to fluid overload less likely early cholecystitis. No calcified stones or biliary dilatation. Pancreas: There is moderate pancreatic tail atrophy. The more proximal pancreas is unremarkable. There are no inflammatory changes or ductal dilatation. No mass enhancement. Spleen: No focal abnormality or splenomegaly. Stomach/bowel: No bowel obstruction or inflammation is seen. There is a normal caliber appendix. There is a [place] nonobstructing transmesenteric internal hernia of the ascending mesocolon. There are thickened folds in the small bowel and stomach. There is circumferential thickening in the mid to distal rectum with stranding posterior to the rectum. Adrenals/urinary tract: There is no adrenal mass. Bilateral renal atrophy is noted chronically. 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 Largest is 2 cm on the  left. Others are smaller and some show trace calcifications. Many are too small to characterize. There are no urinary stones or obstruction. The bladder is thickened and there is perivesical stranding.This is increased since the prior study. Vascular/Lymphatic: There are extensive age-advanced vascular calcifications. There is no AAA. No adenopathy is seen. Reproductive: There is no prostatomegaly. Other: There is symmetric body wall edema in the posterior buttocks. There is trace ascites in the posterior deep pelvis. There is a small umbilical fat hernia. There is no incarcerated hernia. Musculoskeletal: There is osteopenia of the pelvis and proximal femurs, osteopenia but with dense endplates in the spine consistent with renal osteodystrophy. No regional fracture is seen. IMPRESSION: 1. Increased thickening and adjacent stranding involving the bladder, concerning for cystitis. 2. Increased thickening and dorsal stranding of the rectum which could be congestive wall thickening, proctitis or infiltrating disease. Consider follow-up colonoscopy when clinically feasible. 3. Gastroenteritis without evidence of small-bowel obstruction or inflammation. 4. Atrophic kidneys with innumerable cysts and too small to characterize hypodensities consistent with chronic dialysis. 5. Intrahepatic periportal edema probably due to fluid overload, less likely hepatitis. 6. Minimal pericholecystic fluid most likely due to fluid overload, less likely cholecystitis. 7. Small right and trace left pleural effusions. No lung base infiltrates. 8. Increased changes of diffuse fibroglandular gynecomastia. 9. Aortic atherosclerosis and additional extensive vascular calcifications. Electronically Signed   By: Telford Nab M.D.   On: 08/16/2021 03:55   CT HEAD WO CONTRAST (5MM)  Result Date: 08/16/2021 CLINICAL DATA:  Hyperglycemia with delirium. EXAM: CT HEAD WITHOUT CONTRAST TECHNIQUE: Contiguous axial images were obtained from the base  of the skull through the vertex without intravenous contrast. RADIATION DOSE REDUCTION: This exam was performed according to the departmental dose-optimization program which includes automated exposure control, adjustment of the mA and/or kV according to patient size and/or use of iterative reconstruction technique. COMPARISON:  Head CT 02/11/2021. FINDINGS: Brain: Imaging of the skull base is compromised by patient motion. The inferior cerebellar hemispheres are obscured by motion. There is moderate age-advanced cerebral atrophy with atrophic ventriculomegaly and moderate to severe small vessel disease of the cerebral white matter versus demyelinating disease. The visualized cerebellum and brainstem are relatively unremarkable. No asymmetry is seen concerning for an acute infarct, hemorrhage or mass. There is no midline shift. Benign dural calcifications are scattered along the falx. Vascular: There are calcifications in the carotid siphons but no hyperdense central vessels. Skull: No skull fracture or lesion is evident with poor visualization of the skull base due to patient motion. Sinuses/Orbits: No obvious sinus abnormality accounting for patient motion. There are bilateral chronic retinal and lens calcifications both globes, posteromedial dislocation of the left lens,  and chronic asymmetrically smaller size of the left globe. There is extensive fluid opacification of the right mastoid air cells, middle ear cavity. This was seen previously. Other: None. IMPRESSION: 1. No acute intracranial CT findings or changes. 2. Age-advanced atrophy and hypoattenuating small-vessel disease. 3. Diffuse fluid opacification of the right mastoid air cells and middle ear, seen previously. Electronically Signed   By: Telford Nab M.D.   On: 08/16/2021 03:32   DG Chest Port 1 View  Result Date: 08/16/2021 CLINICAL DATA:  Nausea vomiting high blood sugar EXAM: PORTABLE CHEST 1 VIEW COMPARISON:  03/25/2021 FINDINGS:  Right-sided central venous catheter tips over the right atrium. Small right-sided pleural effusion. Vascular stent beneath the right clavicle. No consolidation or pneumothorax. Stable cardiomediastinal silhouette with vascular congestion. IMPRESSION: 1. Small right-sided pleural effusion 2. Vascular congestion Electronically Signed   By: Donavan Foil M.D.   On: 08/16/2021 00:29    Assessment/Plan 1.  Complication dialysis device:  Patient's Tunneled catheter is not being used. The patient has an extremity access that is functioning well. Therefore, the patient will undergo removal of the tunneled catheter under local anesthesia.  The risks and benefits were described to the patient.  All questions were answered.  2.  End-stage renal disease requiring hemodialysis:  Patient will continue dialysis therapy without further interruption  3.  Hypertension:  Patient will continue medical management; nephrology is following no changes in oral medications. 4. Diabetes mellitus:  Glucose will be monitored and oral medications been held this morning once the patient has undergone the patient's procedure po intake will be reinitiated and again Accu-Cheks will be used to assess the blood glucose level and treat as needed. The patient will be restarted on the patient's usual hypoglycemic regime 5.  Coronary artery disease:  EKG will be monitored. Nitrates will be used if needed. The patient's oral cardiac medications will be continued.    Leotis Pain, MD  08/25/2021 9:33 AM

## 2021-08-26 ENCOUNTER — Encounter: Payer: Self-pay | Admitting: Vascular Surgery

## 2021-12-05 ENCOUNTER — Encounter (INDEPENDENT_AMBULATORY_CARE_PROVIDER_SITE_OTHER): Payer: Self-pay

## 2021-12-07 DEATH — deceased
